# Patient Record
Sex: Male | Born: 1955
Health system: Southern US, Community
[De-identification: ages and names within clinical notes are randomized; demographics above are authoritative.]

## PROBLEM LIST (undated history)

## (undated) ENCOUNTER — Ambulatory Visit (HOSPITAL_COMMUNITY): Admission: EM | Disposition: A | Discharge status: Other Acute Inpatient Hospital | Payer: BLUE CROSS/BLUE SHIELD

## (undated) DIAGNOSIS — I2510 Atherosclerotic heart disease of native coronary artery without angina pectoris: Secondary | ICD-10-CM

## (undated) DIAGNOSIS — I10 Essential (primary) hypertension: Secondary | ICD-10-CM

## (undated) DIAGNOSIS — Z87891 Personal history of nicotine dependence: Secondary | ICD-10-CM

## (undated) DIAGNOSIS — F32A Depression, unspecified: Secondary | ICD-10-CM

## (undated) DIAGNOSIS — Z8719 Personal history of other diseases of the digestive system: Secondary | ICD-10-CM

## (undated) DIAGNOSIS — C4491 Basal cell carcinoma of skin, unspecified: Secondary | ICD-10-CM

## (undated) DIAGNOSIS — U071 COVID-19: Secondary | ICD-10-CM

## (undated) DIAGNOSIS — M1990 Unspecified osteoarthritis, unspecified site: Secondary | ICD-10-CM

## (undated) DIAGNOSIS — J40 Bronchitis, not specified as acute or chronic: Secondary | ICD-10-CM

## (undated) DIAGNOSIS — R32 Unspecified urinary incontinence: Secondary | ICD-10-CM

## (undated) DIAGNOSIS — F1910 Other psychoactive substance abuse, uncomplicated: Secondary | ICD-10-CM

## (undated) DIAGNOSIS — F419 Anxiety disorder, unspecified: Secondary | ICD-10-CM

## (undated) DIAGNOSIS — H269 Unspecified cataract: Secondary | ICD-10-CM

## (undated) HISTORY — DX: Unspecified cataract: H26.9

## (undated) HISTORY — DX: Personal history of nicotine dependence: Z87.891

## (undated) HISTORY — DX: Essential (primary) hypertension: I10

## (undated) HISTORY — PX: HERNIA REPAIR: SHX51

## (undated) HISTORY — PX: REPLACEMENT TOTAL KNEE BILATERAL: SUR1225

## (undated) HISTORY — PX: TONSILLECTOMY: SUR1361

## (undated) HISTORY — DX: Unspecified urinary incontinence: R32

## (undated) HISTORY — PX: KNEE ARTHROSCOPY: SUR90

---
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
## 2009-12-29 ENCOUNTER — Emergency Department: Payer: Self-pay | Admitting: Internal Medicine

## 2014-05-04 DIAGNOSIS — H9193 Unspecified hearing loss, bilateral: Secondary | ICD-10-CM | POA: Insufficient documentation

## 2014-06-25 ENCOUNTER — Encounter (HOSPITAL_COMMUNITY): Payer: Self-pay | Admitting: Emergency Medicine

## 2014-06-25 ENCOUNTER — Emergency Department (HOSPITAL_COMMUNITY)
Admission: EM | Admit: 2014-06-25 | Discharge: 2014-06-25 | Disposition: A | Discharge status: Home / Self Care | Payer: BLUE CROSS/BLUE SHIELD | Attending: Emergency Medicine | Admitting: Emergency Medicine

## 2014-06-25 DIAGNOSIS — S0501XA Injury of conjunctiva and corneal abrasion without foreign body, right eye, initial encounter: Secondary | ICD-10-CM | POA: Diagnosis not present

## 2014-06-25 DIAGNOSIS — Y9389 Activity, other specified: Secondary | ICD-10-CM | POA: Insufficient documentation

## 2014-06-25 DIAGNOSIS — H66001 Acute suppurative otitis media without spontaneous rupture of ear drum, right ear: Secondary | ICD-10-CM

## 2014-06-25 DIAGNOSIS — Y998 Other external cause status: Secondary | ICD-10-CM | POA: Insufficient documentation

## 2014-06-25 DIAGNOSIS — H9201 Otalgia, right ear: Secondary | ICD-10-CM | POA: Diagnosis present

## 2014-06-25 DIAGNOSIS — Y9289 Other specified places as the place of occurrence of the external cause: Secondary | ICD-10-CM | POA: Insufficient documentation

## 2014-06-25 DIAGNOSIS — X58XXXA Exposure to other specified factors, initial encounter: Secondary | ICD-10-CM | POA: Diagnosis not present

## 2014-06-25 MED ORDER — NAPROXEN 500 MG PO TABS: 500.0000 mg | ORAL_TABLET | Freq: Two times a day (BID) | ORAL | Status: DC

## 2014-06-25 MED ORDER — HYDROCODONE-ACETAMINOPHEN 5-325 MG PO TABS: 1.0000 | ORAL_TABLET | ORAL | Status: DC | PRN

## 2014-06-25 MED ORDER — HYDROCODONE-ACETAMINOPHEN 5-325 MG PO TABS
2.0000 | ORAL_TABLET | Freq: Once | ORAL | Status: AC
  Administered 2014-06-25: 2 via ORAL
  Filled 2014-06-25: qty 2

## 2014-06-25 MED ORDER — NAPROXEN 250 MG PO TABS
500.0000 mg | ORAL_TABLET | Freq: Once | ORAL | Status: AC
  Administered 2014-06-25: 500 mg via ORAL
  Filled 2014-06-25: qty 2

## 2014-06-25 MED ORDER — FLUORESCEIN SODIUM 1 MG OP STRP
1.0000 | ORAL_STRIP | Freq: Once | OPHTHALMIC | Status: AC
  Administered 2014-06-25: 1 via OPHTHALMIC
  Filled 2014-06-25: qty 1

## 2014-06-25 MED ORDER — AMOXICILLIN 500 MG PO CAPS: 500.0000 mg | ORAL_CAPSULE | Freq: Three times a day (TID) | ORAL | Status: DC

## 2014-06-25 MED ORDER — TETRACAINE HCL 0.5 % OP SOLN
1.0000 [drp] | Freq: Once | OPHTHALMIC | Status: AC
  Administered 2014-06-25: 1 [drp] via OPHTHALMIC
  Filled 2014-06-25: qty 2

## 2014-06-25 MED ORDER — CIPROFLOXACIN HCL 0.3 % OP SOLN: 1.0000 [drp] | OPHTHALMIC | Status: DC

## 2014-06-25 NOTE — Discharge Instructions (Signed)
Please follow directions provided. Be sure to follow-up with your primary care provider to ensure you're getting better. If your symptoms do not improve, please follow up with your ear nose and throat doctor for your ear and the ophthalmology referral provided for your eye. Please take the oral antibiotics as directed and use the drops for the abrasion to the your cornea in your right eye. Please use the naproxen twice a day for pain and inflammation. Use Vicodin for pain not relieved by the naproxen. Hesitate to return for any new, worsening, or concerning symptoms.  SEEK MEDICAL CARE IF:  You have pain, light sensitivity, and a scratchy feeling in one eye or both eyes.  Your pressure patch keeps loosening up, and you can blink your eye under the patch after treatment.  Any kind of discharge develops from the eye after treatment or if the lids stick together in the morning.  You have the same symptoms in the morning as you did with the original abrasion days, weeks, or months after the abrasion healed.   SEEK IMMEDIATE MEDICAL CARE IF:  You have pain that is not controlled with medicine.  You have swelling, redness, or pain around your ear or stiffness in your neck.  You notice that part of your face is paralyzed.  You notice that the bone behind your ear (mastoid) is tender when you touch it.

## 2014-06-25 NOTE — ED Notes (Signed)
Patient here with complaint of right eye and right ear pain. 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 States that over the past 2 weeks his right eye has become irritated and somewhat swollen. Also reports that right ear began to hurt tonight, he felt a pop in his ear, and he began to have severe right ear pain. States that he took a Hydrocodone about 30 minutes ago which has helped the pain significantly.

## 2014-06-25 NOTE — ED Notes (Signed)
Pt A&OX4, ambulatory at d/c with steady gait, NAD 

## 2014-06-25 NOTE — ED Provider Notes (Signed)
CSN: [id number]     Arrival date & time 06/25/14  2120 History   This chart was scribed for [employer]. Alvino Chapel, MD by Chester Holstein, ED Scribe. This patient was seen in room TR08C/TR08C and the patient's care was started at 10:01 PM.      Chief Complaint  Patient presents with  . Eye Pain  . Otalgia     Patient is a 59 y.o. male presenting with eye pain and ear pain. The history is provided by the patient. No language interpreter was used.  Eye Pain  Otalgia   HPI Comments: Samuel [name] is a 59 y.o. male who presents to the Emergency Department complaining of waxing and waning eye pain with onset 12 days ago. Pt notes associated eye swelling, pain, discharge, and itching. Pt notes discharge is clear during day. Pt notes 10/10 ear pain with onset today at 5PM. Pt notes he tried to equalize pressure in his ear, and states he felt a pop in his ear at onset and pain worsened.  Pt notes he has used a compress for relief.  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   History reviewed. No pertinent past medical history. History reviewed. No pertinent past surgical history. History reviewed. No pertinent family history. History  Substance Use Topics  . Smoking status: Never Smoker   . Smokeless tobacco: Not on file  . Alcohol Use: No    Review of Systems  HENT: Positive for ear pain.   Eyes: Positive for pain, discharge, redness and itching.  All other systems reviewed and are negative.     Allergies  Review of patient's allergies indicates no known allergies.  Home Medications   Prior to Admission medications   Not on File   BP 173/97 mmHg  Pulse 87  Temp(Src) 97.3 F (36.3 C) (Oral)  Resp 20  Ht 5\' 7"  (1.702 m)  Wt 260 lb (117.935 kg)  BMI 40.71 kg/m2  SpO2 97% Physical Exam  Constitutional: He is oriented to person, place, and time. He appears well-developed and well-nourished.  HENT:  Head: Normocephalic.  Right Ear: Tympanic  membrane is erythematous and bulging. Tympanic membrane is not perforated.  Erythema to right ear canal TM opaque with effusion noted behind the ear drum  Eyes: Conjunctivae are normal.  Slit lamp exam:      The right eye shows corneal abrasion (center of cornea).  Neck: Normal range of motion. Neck supple.  Pulmonary/Chest: Effort normal.  Musculoskeletal: Normal range of motion.  Neurological: He is alert and oriented to person, place, and time.  Skin: Skin is warm and dry.  Psychiatric: He has a normal mood and affect. His behavior is normal.  Nursing note and vitals reviewed.   ED Course  Procedures (including critical care time) DIAGNOSTIC STUDIES: Oxygen Saturation is 97% on room air, normal by my interpretation.    COORDINATION OF CARE: 10:50 PM Discussed treatment plan with patient at beside, the patient agrees with the plan and has no further questions at this time.  Labs Review Labs Reviewed - No data to display  Imaging Review No results found.   EKG Interpretation None      MDM   Final diagnoses:  Corneal abrasion, right, initial encounter  Acute suppurative otitis media of right ear without spontaneous rupture of tympanic membrane, recurrence not specified   59 yo male presenting with report of 10 days of red, itchy eye, with tearing, on  slit lamp exam, no foreign body, but central corneal abrasion noted. Pt is not a contact lens wearer.  Exam non-concerning for orbital cellulitis, hyphema, corneal ulcers. Patient understands to follow up with ophthalmology, & to return to ER if new symptoms develop including change in vision, purulent drainage, or entrapment. Prescription for Ciloxan provided. Pt also reports new onset ear pain today.  He has had pressure behind ear and attempted to normalize pressure today which caused significant pain he has since been having difficulty hearing.  Exam consistent with acute otitis media. He has no mastoid tenderness and no  concern for acute mastoiditis, meningitis. He was prescribed a amoxicillin and discussed following up with his PCP. Pt is well-appearing, in no acute distress and vital signs are stable.  They appear safe to be discharged.  Return precautions provided.       I personally performed the services described in this documentation, which was scribed in my presence. The recorded information has been reviewed and is accurate.  Filed Vitals:   06/25/14 2130 06/25/14 2253  BP: 173/97 144/84  Pulse: 87 82  Temp: 97.3 F (36.3 C)   TempSrc: Oral   Resp: 20 18  Height: 5\' 7"  (1.702 m)   Weight: 260 lb (117.935 kg)   SpO2: 97% 96%   Meds given in ED:  Medications  tetracaine (PONTOCAINE) 0.5 % ophthalmic solution 1 drop (1 drop Right Eye Given 06/25/14 2214)  fluorescein ophthalmic strip 1 strip (1 strip Right Eye Given 06/25/14 2213)  HYDROcodone-acetaminophen (NORCO/VICODIN) 5-325 MG per tablet 2 tablet (2 tablets Oral Given 06/25/14 2252)  naproxen (NAPROSYN) tablet 500 mg (500 mg Oral Given 06/25/14 2305)    Discharge Medication List as of 06/25/2014 11:02 PM    START taking these medications   Details  amoxicillin (AMOXIL) 500 MG capsule Take 1 capsule (500 mg total) by mouth 3 (three) times daily., Starting 06/25/2014, Until Discontinued, Print    ciprofloxacin (CILOXAN) 0.3 % ophthalmic solution Place 1 drop into the right eye every 4 (four) hours. Place one drop in effected eye every 4 hours until follow up with opthalmologist, Starting 06/25/2014, Until Discontinued, Print    HYDROcodone-acetaminophen (NORCO/VICODIN) 5-325 MG per tablet Take 1-2 tablets by mouth every 4 (four) hours as needed., Starting 06/25/2014, Until Discontinued, Print    naproxen (NAPROSYN) 500 MG tablet Take 1 tablet (500 mg total) by mouth 2 (two) times daily., Starting 06/25/2014, Until Discontinued, Print             [name], NP 06/26/14 [place] Alvino Chapel, MD 06/28/14 0700

## 2017-05-13 NOTE — H&P (Signed)
TOTAL KNEE ADMISSION H&P  Patient is being admitted for right total knee arthroplasty.  Subjective:  Chief Complaint:    Right knee primary OA / pain  HPI: Samuel [name], 61 y.o. male, has a history of pain and functional disability in the right knee due to arthritis and has failed non-surgical conservative treatments for greater than 12 weeks to include NSAID's and/or analgesics, corticosteriod injections, viscosupplementation injections and activity modification.  Onset of symptoms was gradual, starting 1+ years ago with gradually worsening course since that time. The patient noted prior procedures on the knee to include  arthroscopy on the right knee(s).  Patient currently rates pain in the right knee(s) at 8 out of 10 with activity. Patient has night pain, worsening of pain with activity and weight bearing, pain that interferes with activities of daily living, pain with passive range of motion, crepitus and joint swelling.  Patient has evidence of periarticular osteophytes and joint space narrowing by imaging studies.  There is no active infection.  Risks, benefits and expectations were discussed with the patient.  Risks including but not limited to the risk of anesthesia, blood clots, nerve damage, blood vessel damage, failure of the prosthesis, infection and up to and including death.  Patient understand the risks, benefits and expectations and wishes to proceed with surgery.   PCP: Samuel Sheldon, PA-C  D/C Plans:       Home   Post-op Meds:       No Rx given  Tranexamic Acid:      To be given - IV   Decadron:      Is to be given  FYI:      ASA  Norco  DME:   Pt already has equipment   PT:   OPPT Rx given   Patient Active Problem List   Diagnosis Date Noted  . Essential hypertension 05/27/2017  . Prostate cancer screening 05/27/2017  . Severe obesity (BMI >= 40) ([place]) 05/27/2017   No past medical history on file.  No past surgical history on file.  No current  facility-administered medications for this encounter.    Current Outpatient Medications  Medication Sig Dispense Refill Last Dose  . lisinopril (PRINIVIL,ZESTRIL) 10 MG tablet Take 1 tablet (10 mg total) by mouth daily. 30 tablet 0    No Known Allergies   Social History   Tobacco Use  . Smoking status: Never Smoker  . Smokeless tobacco: Never Used  Substance Use Topics  . Alcohol use: No       Review of Systems  Constitutional: Negative.   HENT: Negative.   Eyes: Negative.   Respiratory: Negative.   Cardiovascular: Negative.   Gastrointestinal: Negative.   Genitourinary: Negative.   Musculoskeletal: Positive for joint pain.  Skin: Negative.   Neurological: Negative.   Endo/Heme/Allergies: Negative.   Psychiatric/Behavioral: Negative.     Objective:  Physical Exam  Constitutional: He is oriented to person, place, and time. He appears well-developed.  HENT:  Head: Normocephalic.  Eyes: Pupils are equal, round, and reactive to light.  Neck: Neck supple. No JVD present. No tracheal deviation present. No thyromegaly present.  Cardiovascular: Normal rate, regular rhythm and intact distal pulses.  Respiratory: Effort normal and breath sounds normal. No respiratory distress. He has no wheezes.  GI: Soft. There is no tenderness. There is no guarding.  Musculoskeletal:       Right knee: He exhibits decreased range of motion, swelling and bony tenderness. He exhibits no ecchymosis, no deformity, no  laceration and no erythema. Tenderness found.  Lymphadenopathy:    He has no cervical adenopathy.  Neurological: He is alert and oriented to person, place, and time. A sensory deficit (bilateral LE numbness / tingling) is present.  Skin: Skin is warm and dry.  Psychiatric: He has a normal mood and affect.      Labs:  Estimated body mass index is 46.29 kg/m as calculated from the following:   Height as of 05/27/17: 5\' 6"  (1.676 m).   Weight as of 05/27/17: 130.1 kg (286 lb  12.8 oz).   Imaging Review Plain radiographs demonstrate severe degenerative joint disease of the right knee(s). The bone quality appears to be good for age and reported activity level.  Assessment/Plan:  End stage arthritis, right knee   The patient history, physical examination, clinical judgment of the provider and imaging studies are consistent with end stage degenerative joint disease of the right knee(s) and total knee arthroplasty is deemed medically necessary. The treatment options including medical management, injection therapy arthroscopy and arthroplasty were discussed at length. The risks and benefits of total knee arthroplasty were presented and reviewed. The risks due to aseptic loosening, infection, stiffness, patella tracking problems, thromboembolic complications and other imponderables were discussed. The patient acknowledged the explanation, agreed to proceed with the plan and consent was signed. Patient is being admitted for inpatient treatment for surgery, pain control, PT, OT, prophylactic antibiotics, VTE prophylaxis, progressive ambulation and ADL's and discharge planning. The patient is planning to be discharged home.    Samuel Pugh Babish   PA-C  05/28/2017, 5:01 PM

## 2017-05-20 NOTE — Patient Instructions (Signed)
Samuel [name]  05/20/2017   Your procedure is scheduled on:  Monday, Dec. 17, 2018   Report to [facility] Main  Entrance    Report to admitting at 8:45 AM   Call this number if you have problems the morning of surgery [phone]   Do not eat food or drink liquids :After Midnight.    Take these medicines the morning of surgery with A SIP OF WATER: None                               You may not have any metal on your body including jewelry             Do not wear lotions, powders, perfumes,  or deodorant                          Men may shave face and neck.   Do not bring valuables to the hospital. [place].   Contacts, dentures or bridgework may not be worn into surgery.   Leave suitcase in the car. After surgery it may be brought to your room.              Please read over the following fact sheets you were given: _____________________________________________________________________  [facility] - Preparing for Surgery Before surgery, you can play an important role.  Because skin is not sterile, your skin needs to be as free of germs as possible.  You can reduce the number of germs on your skin by washing with CHG (chlorahexidine gluconate) soap before surgery.  CHG is an antiseptic cleaner which kills germs and bonds with the skin to continue killing germs even after washing. Please DO NOT use if you have an allergy to CHG or antibacterial soaps.  If your skin becomes reddened/irritated stop using the CHG and inform your nurse when you arrive at Short Stay. Do not shave (including legs and underarms) for at least 48 hours prior to the first CHG shower.  You may shave your face/neck.  Please follow these instructions carefully:  1.  Shower with CHG Soap the night before surgery and the  morning of surgery.  2.  If you choose to wash your hair, wash your hair first as usual with your normal   shampoo.  3.  After you shampoo, rinse your hair and body thoroughly to remove the shampoo.                             4.  Use CHG as you would any other liquid soap.  You can apply chg directly to the skin and wash.  Gently with a scrungie or clean washcloth.  5.  Apply the CHG Soap to your body ONLY FROM THE NECK DOWN.   Do   not use on face/ open                           Wound or open sores. Avoid contact with eyes, ears mouth and   genitals (private parts).  Wash face,  Genitals (private parts) with your normal soap.             6.  Wash thoroughly, paying special attention to the area where your    surgery  will be performed.  7.  Thoroughly rinse your body with warm water from the neck down.  8.  DO NOT shower/wash with your normal soap after using and rinsing off the CHG Soap.                9.  Pat yourself dry with a clean towel.            10.  Wear clean pajamas.            11.  Place clean sheets on your bed the night of your first shower and do not  sleep with pets. Day of Surgery : Do not apply any lotions/deodorants the morning of surgery.  Please wear clean clothes to the hospital/surgery center.  FAILURE TO FOLLOW THESE INSTRUCTIONS MAY RESULT IN THE CANCELLATION OF YOUR SURGERY  PATIENT SIGNATURE_________________________________  NURSE SIGNATURE__________________________________  ________________________________________________________________________   Samuel [name]  An incentive spirometer is a tool that can help keep your lungs clear and active. This tool measures how well you are filling your lungs with each breath. Taking long deep breaths may help reverse or decrease the chance of developing breathing (pulmonary) problems (especially infection) following:  A long period of time when you are unable to move or be active. BEFORE THE PROCEDURE   If the spirometer includes an indicator to show your best effort, your nurse or respiratory  therapist will set it to a desired goal.  If possible, sit up straight or lean slightly forward. Try not to slouch.  Hold the incentive spirometer in an upright position. INSTRUCTIONS FOR USE  1. Sit on the edge of your bed if possible, or sit up as far as you can in bed or on a chair. 2. Hold the incentive spirometer in an upright position. 3. Breathe out normally. 4. Place the mouthpiece in your mouth and seal your lips tightly around it. 5. Breathe in slowly and as deeply as possible, raising the piston or the ball toward the top of the column. 6. Hold your breath for 3-5 seconds or for as long as possible. Allow the piston or ball to fall to the bottom of the column. 7. Remove the mouthpiece from your mouth and breathe out normally. 8. Rest for a few seconds and repeat Steps 1 through 7 at least 10 times every 1-2 hours when you are awake. Take your time and take a few normal breaths between deep breaths. 9. The spirometer may include an indicator to show your best effort. Use the indicator as a goal to work toward during each repetition. 10. After each set of 10 deep breaths, practice coughing to be sure your lungs are clear. If you have an incision (the cut made at the time of surgery), support your incision when coughing by placing a pillow or rolled up towels firmly against it. Once you are able to get out of bed, walk around indoors and cough well. You may stop using the incentive spirometer when instructed by your caregiver.  RISKS AND COMPLICATIONS  Take your time so you do not get dizzy or light-headed.  If you are in pain, you may need to take or ask for pain medication before doing incentive spirometry. It is harder to take a deep breath if you  are having pain. AFTER USE  Rest and breathe slowly and easily.  It can be helpful to keep track of a log of your progress. Your caregiver can provide you with a simple table to help with this. If you are using the spirometer at home,  follow these instructions: [place] IF:   You are having difficultly using the spirometer.  You have trouble using the spirometer as often as instructed.  Your pain medication is not giving enough relief while using the spirometer.  You develop fever of 100.5 F (38.1 C) or higher. SEEK IMMEDIATE MEDICAL CARE IF:   You cough up bloody sputum that had not been present before.  You develop fever of 102 F (38.9 C) or greater.  You develop worsening pain at or near the incision site. MAKE SURE YOU:   Understand these instructions.  Will watch your condition.  Will get help right away if you are not doing well or get worse. Document Released: 10/13/2006 Document Revised: 08/25/2011 Document Reviewed: 12/14/2006 ExitCare Patient Information 2014 [employer], [place].   ________________________________________________________________________  WHAT IS A BLOOD TRANSFUSION? Blood Transfusion Information  A transfusion is the replacement of blood or some of its parts. Blood is made up of multiple cells which provide different functions.  Red blood cells carry oxygen and are used for blood loss replacement.  White blood cells fight against infection.  Platelets control bleeding.  Plasma helps clot blood.  Other blood products are available for specialized needs, such as hemophilia or other clotting disorders. BEFORE THE TRANSFUSION  Who gives blood for transfusions?   Healthy volunteers who are fully evaluated to make sure their blood is safe. This is blood bank blood. Transfusion therapy is the safest it has ever been in the practice of medicine. Before blood is taken from a donor, a complete history is taken to make sure that person has no history of diseases nor engages in risky social behavior (examples are intravenous drug use or sexual activity with multiple partners). The donor's travel history is screened to minimize risk of transmitting infections, such as malaria.  The donated blood is tested for signs of infectious diseases, such as HIV and hepatitis. The blood is then tested to be sure it is compatible with you in order to minimize the chance of a transfusion reaction. If you or a relative donates blood, this is often done in anticipation of surgery and is not appropriate for emergency situations. It takes many days to process the donated blood. RISKS AND COMPLICATIONS Although transfusion therapy is very safe and saves many lives, the main dangers of transfusion include:   Getting an infectious disease.  Developing a transfusion reaction. This is an allergic reaction to something in the blood you were given. Every precaution is taken to prevent this. The decision to have a blood transfusion has been considered carefully by your caregiver before blood is given. Blood is not given unless the benefits outweigh the risks. AFTER THE TRANSFUSION  Right after receiving a blood transfusion, you will usually feel much better and more energetic. This is especially true if your red blood cells have gotten low (anemic). The transfusion raises the level of the red blood cells which carry oxygen, and this usually causes an energy increase.  The nurse administering the transfusion will monitor you carefully for complications. HOME CARE INSTRUCTIONS  No special instructions are needed after a transfusion. You may find your energy is better. Speak with your caregiver about any limitations on activity  for underlying diseases you may have. SEEK MEDICAL CARE IF:   Your condition is not improving after your transfusion.  You develop redness or irritation at the intravenous (IV) site. SEEK IMMEDIATE MEDICAL CARE IF:  Any of the following symptoms occur over the next 12 hours:  Shaking chills.  You have a temperature by mouth above 102 F (38.9 C), not controlled by medicine.  Chest, back, or muscle pain.  People around you feel you are not acting correctly or are  confused.  Shortness of breath or difficulty breathing.  Dizziness and fainting.  You get a rash or develop hives.  You have a decrease in urine output.  Your urine turns a dark color or changes to pink, red, or brown. Any of the following symptoms occur over the next 10 days:  You have a temperature by mouth above 102 F (38.9 C), not controlled by medicine.  Shortness of breath.  Weakness after normal activity.  The white part of the eye turns yellow (jaundice).  You have a decrease in the amount of urine or are urinating less often.  Your urine turns a dark color or changes to pink, red, or brown. Document Released: 05/30/2000 Document Revised: 08/25/2011 Document Reviewed: 01/17/2008 [facility] Patient Information 2014 [employer], [place].  _______________________________________________________________________

## 2017-05-27 ENCOUNTER — Other Ambulatory Visit: Payer: Self-pay

## 2017-05-27 ENCOUNTER — Ambulatory Visit (INDEPENDENT_AMBULATORY_CARE_PROVIDER_SITE_OTHER): Payer: BLUE CROSS/BLUE SHIELD | Admitting: Physician Assistant

## 2017-05-27 ENCOUNTER — Other Ambulatory Visit (HOSPITAL_COMMUNITY): Payer: Self-pay | Admitting: Emergency Medicine

## 2017-05-27 ENCOUNTER — Encounter: Payer: Self-pay | Admitting: Physician Assistant

## 2017-05-27 VITALS — BP 170/98 | HR 93 | Temp 98.4°F | Resp 16 | Ht 66.0 in | Wt 286.8 lb

## 2017-05-27 DIAGNOSIS — R197 Diarrhea, unspecified: Secondary | ICD-10-CM | POA: Diagnosis not present

## 2017-05-27 DIAGNOSIS — Z0000 Encounter for general adult medical examination without abnormal findings: Secondary | ICD-10-CM | POA: Diagnosis not present

## 2017-05-27 DIAGNOSIS — R110 Nausea: Secondary | ICD-10-CM | POA: Diagnosis not present

## 2017-05-27 DIAGNOSIS — Z125 Encounter for screening for malignant neoplasm of prostate: Secondary | ICD-10-CM | POA: Diagnosis not present

## 2017-05-27 DIAGNOSIS — I10 Essential (primary) hypertension: Secondary | ICD-10-CM | POA: Insufficient documentation

## 2017-05-27 DIAGNOSIS — Z114 Encounter for screening for human immunodeficiency virus [HIV]: Secondary | ICD-10-CM

## 2017-05-27 DIAGNOSIS — Z01818 Encounter for other preprocedural examination: Secondary | ICD-10-CM

## 2017-05-27 DIAGNOSIS — R0609 Other forms of dyspnea: Secondary | ICD-10-CM | POA: Diagnosis not present

## 2017-05-27 DIAGNOSIS — Z1159 Encounter for screening for other viral diseases: Secondary | ICD-10-CM | POA: Diagnosis not present

## 2017-05-27 DIAGNOSIS — R0600 Dyspnea, unspecified: Secondary | ICD-10-CM

## 2017-05-27 MED ORDER — LISINOPRIL 10 MG PO TABS: 10.0000 mg | ORAL_TABLET | Freq: Every day | ORAL | 0 refills | Status: DC

## 2017-05-27 NOTE — Progress Notes (Signed)
Patient ID: Samuel [name] MRN: [id number], DOB: August 16, 1955, 61 y.o. Date of Encounter: '@DATE'$ @  Chief Complaint:  Chief Complaint  Patient presents with  . clearance for surgery  . Foot Swelling  . Headache  . Diarrhea    xdays    HPI: 61 y.o. year old male  presents with above.   He presents as a new patient to establish care.  He is scheduled for right total knee replacement on Monday 06/01/2017 with Dr. Alvan Dame. However, he states that he already realizes that this is probably going to have to be postponed/rescheduled and I have told him today that it definitely will need to be.  I informed him to call [facility] and inform them of today's visit and plans and need to postpone surgery. In regards to the right total knee replacement he also says "I get myself all worked up-----I have heard so many stories of people having knee replacement surgery and that the first 20 days are hell"--- "and that he is concerned because he cannot take any type of opioids because they cause him itching so is concerned that he cannot take pain medicines and the amount of pain that he will have."  He reports that he had a cardiac catheterization about 10 years ago at [facility].  Says that he was told that there was some blockage but not significant enough to require stents.  However says "but that was 10 years ago so I do not know if things have changed since then ".  Says that recently when he walks to the mailbox and back, he is significantly short of breath.  States that this is new.  Names off activities that he used to be able to do-- with no shortness of breath / DOE.  Also discussed that his blood pressure is reading significantly high today.  Reports that in the past when he would be at work if he had a headache he would get the CNA at work to check his blood pressure and it would be high.  Says "then later the headache would be gone" /resolved and that he "would get his blood pressure  rechecked and it would be okay." He has no other known history of high blood pressure but also admits that he has never had a PCP until now.  Reports that he has been experiencing nausea and diarrhea for the past 3 days.  Has had no vomiting.  Says that about 3 days ago the vomiting was significant frequency.  Says that yesterday he did not eat and his stomach would feel upset but then nothing would really come out because he had not eaten.  States that he ate again last night and then did have diarrhea again this morning. Later, when it came time to get labs and I was wondering whether he was fasting, if he had had anything to eat today-- he says "A couple of pieces of cheese."  Reports that he quit smoking 20 years ago.  Reports that he has no other known medical history.   History reviewed. No pertinent past medical history.   Home Meds: Outpatient Medications Prior to Visit  Medication Sig Dispense Refill  . amoxicillin (AMOXIL) 500 MG capsule Take 1 capsule (500 mg total) by mouth 3 (three) times daily. 30 capsule 0  . ciprofloxacin (CILOXAN) 0.3 % ophthalmic solution Place 1 drop into the right eye every 4 (four) hours. Place one drop in effected eye every 4 hours until follow up  with opthalmologist 2.5 mL 1  . HYDROcodone-acetaminophen (NORCO/VICODIN) 5-325 MG per tablet Take 1-2 tablets by mouth every 4 (four) hours as needed. 6 tablet 0  . naproxen (NAPROSYN) 500 MG tablet Take 1 tablet (500 mg total) by mouth 2 (two) times daily. 30 tablet 0   No facility-administered medications prior to visit.     Allergies: No Known Allergies  Social History   Socioeconomic History  . Marital status: Married    Spouse name: Not on file  . Number of children: Not on file  . Years of education: Not on file  . Highest education level: Not on file  Social Needs  . Financial resource strain: Not on file  . Food insecurity - worry: Not on file  . Food insecurity - inability: Not on file    . Transportation needs - medical: Not on file  . Transportation needs - non-medical: Not on file  Occupational History  . Not on file  Tobacco Use  . Smoking status: Never Smoker  . Smokeless tobacco: Never Used  Substance and Sexual Activity  . Alcohol use: No  . Drug use: No  . Sexual activity: Not on file  Other Topics Concern  . Not on file  Social History Narrative  . Not on file    History reviewed. No pertinent family history.   Review of Systems:  See HPI for pertinent ROS. All other ROS negative.    Physical Exam: Blood pressure (!) 170/98, pulse 93, temperature 98.4 F (36.9 C), temperature source Oral, resp. rate 16, height '5\' 6"'$  (1.676 m), weight 130.1 kg (286 lb 12.8 oz), SpO2 99 %., Body mass index is 46.29 kg/m. General: Obese WM. Appears in no acute distress. Neck: Supple. No thyromegaly. No lymphadenopathy. No carotid bruits (but large neck) Lungs: Clear bilaterally to auscultation without wheezes, rales, or rhonchi. Breathing is unlabored. Heart: RRR with S1 S2. No murmurs, rubs, or gallops. Abdomen: Soft, non-tender, non-distended with normoactive bowel sounds. No hepatomegaly. No rebound/guarding. Umbilical hernia. Very obese Abdomen. Musculoskeletal:  Strength and tone normal for age. Extremities/Skin: Warm and dry. Trace pedal edema. Neuro: Alert and oriented X 3. Moves all extremities spontaneously. Gait is normal. CNII-XII grossly in tact. Psych:  Responds to questions appropriately with a normal affect.     ASSESSMENT AND PLAN:  61 y.o. year old male with   1. Encounter for medical examination to establish care   2. Preoperative evaluation to rule out surgical contraindication - CBC with Differential/Platelet - COMPLETE METABOLIC PANEL WITH GFR - TSH - HIV antibody - PSA - Hepatitis C antibody - Ambulatory referral to Cardiology  3. Dyspnea on exertion - CBC with Differential/Platelet - COMPLETE METABOLIC PANEL WITH GFR - TSH -  Ambulatory referral to Cardiology  4. Essential hypertension BP elevated.  Obtain baseline lab.  Start lisinopril 10 mg daily.  Follow-up visit in 2 weeks to recheck BP and be met on medication. - COMPLETE METABOLIC PANEL WITH GFR - Ambulatory referral to Cardiology - lisinopril (PRINIVIL,ZESTRIL) 10 MG tablet; Take 1 tablet (10 mg total) by mouth daily.  Dispense: 30 tablet; Refill: 0  5. Nausea without vomiting Check lab to evaluate.  Also informed him to stick with clear liquid diet and then slowly advance to bland diet once symptoms have resolved. - CBC with Differential/Platelet - COMPLETE METABOLIC PANEL WITH GFR  6. Diarrhea, unspecified type Check lab to evaluate.  Also informed him to stick with clear liquid diet and then slowly advance to bland  diet once symptoms have resolved. - CBC with Differential/Platelet - COMPLETE METABOLIC PANEL WITH GFR  7. Prostate cancer screening - PSA  8. Need for hepatitis C screening test - Hepatitis C antibody  9. Screening for HIV (human immunodeficiency virus) - HIV antibody  10. Severe obesity (BMI >= 40) (HCC) Will discuss proper diet and exercise at future visit.  11. Possible OSA --Will discuss at future OV. He has very large neck, HTN, Obesity---- high risk for OSA---will discuss at future OV  12. Other Preventive Care---- --Will discuss FLP, Colonoscopy, Immunizations at future OV.   Follow-up office visit in 1-2 weeks.  Follow-up sooner if needed.    [address] [place], [place], [facility] 05/27/2017 4:50 PM

## 2017-05-28 ENCOUNTER — Encounter (HOSPITAL_COMMUNITY)
Admission: RE | Admit: 2017-05-28 | Discharge: 2017-05-28 | Disposition: A | Discharge status: Home / Self Care | Payer: Self-pay | Source: Ambulatory Visit | Attending: Orthopedic Surgery | Admitting: Orthopedic Surgery

## 2017-05-28 LAB — HEPATITIS C ANTIBODY
Hepatitis C Ab: NONREACTIVE
SIGNAL TO CUT-OFF: 0.21 (ref <1.00)

## 2017-05-30 LAB — COMPLETE METABOLIC PANEL WITH GFR
AG Ratio: 1.5 (calc) (ref 1.0–2.5)
ALT: 87 U/L — ABNORMAL HIGH (ref 9–46)
AST: 66 U/L — ABNORMAL HIGH (ref 10–35)
Albumin: 4.5 g/dL (ref 3.6–5.1)
Alkaline phosphatase (APISO): 59 U/L (ref 40–115)
BUN: 18 mg/dL (ref 7–25)
CO2: 24 mmol/L (ref 20–32)
Calcium: 9.2 mg/dL (ref 8.6–10.3)
Chloride: 95 mmol/L — ABNORMAL LOW (ref 98–110)
Creat: 0.87 mg/dL (ref 0.70–1.25)
GFR, Est African American: 106 mL/min/{1.73_m2} (ref >=60)
GFR, Est Non African American: 92 mL/min/{1.73_m2} (ref >=60)
Globulin: 3 g/dL (calc) (ref 1.9–3.7)
Glucose, Bld: 102 mg/dL — ABNORMAL HIGH (ref 65–99)
Potassium: 4.3 mmol/L (ref 3.5–5.3)
Sodium: 133 mmol/L — ABNORMAL LOW (ref 135–146)
Total Bilirubin: 1.3 mg/dL — ABNORMAL HIGH (ref 0.2–1.2)
Total Protein: 7.5 g/dL (ref 6.1–8.1)

## 2017-05-30 LAB — HIV ANTIBODY (ROUTINE TESTING W REFLEX): HIV 1&2 Ab, 4th Generation: NONREACTIVE

## 2017-05-30 LAB — TSH: TSH: 5.99 mIU/L — ABNORMAL HIGH (ref 0.40–4.50)

## 2017-05-30 LAB — HEPATITIS PANEL, ACUTE
Hep A IgM: NONREACTIVE
Hep B C IgM: NONREACTIVE
Hepatitis B Surface Ag: NONREACTIVE
Hepatitis C Ab: NONREACTIVE
SIGNAL TO CUT-OFF: 0.21 (ref <1.00)

## 2017-05-30 LAB — SPECIMEN COMPROMISED

## 2017-05-30 LAB — CBC WITH DIFFERENTIAL/PLATELET
Basophils Absolute: 61 cells/uL (ref 0–200)
Basophils Relative: 0.6 %
Eosinophils Absolute: 61 cells/uL (ref 15–500)
Eosinophils Relative: 0.6 %
HCT: 44.3 % (ref 38.5–50.0)
Hemoglobin: 15.5 g/dL (ref 13.2–17.1)
Lymphs Abs: 1918 cells/uL (ref 850–3900)
MCH: 31.5 pg (ref 27.0–33.0)
MCHC: 35 g/dL (ref 32.0–36.0)
MCV: 90 fL (ref 80.0–100.0)
MPV: 10.7 fL (ref 7.5–12.5)
Monocytes Relative: 11.2 %
Neutro Abs: 7018 cells/uL (ref 1500–7800)
Neutrophils Relative %: 68.8 %
Platelets: 160 10*3/uL (ref 140–400)
RBC: 4.92 10*6/uL (ref 4.20–5.80)
RDW: 12.1 % (ref 11.0–15.0)
Total Lymphocyte: 18.8 %
WBC mixed population: 1142 cells/uL — ABNORMAL HIGH (ref 200–950)
WBC: 10.2 10*3/uL (ref 3.8–10.8)

## 2017-05-30 LAB — TEST AUTHORIZATION

## 2017-05-30 LAB — T4, FREE: Free T4: 1 ng/dL (ref 0.8–1.8)

## 2017-05-30 LAB — PSA: PSA: 0.7 ng/mL (ref <=4.0)

## 2017-06-01 ENCOUNTER — Ambulatory Visit (HOSPITAL_COMMUNITY)
Admission: RE | Admit: 2017-06-01 | Payer: BLUE CROSS/BLUE SHIELD | Source: Ambulatory Visit | Admitting: Orthopedic Surgery

## 2017-06-01 ENCOUNTER — Encounter (HOSPITAL_COMMUNITY): Admission: RE | Payer: Self-pay | Source: Ambulatory Visit

## 2017-06-01 SURGERY — ARTHROPLASTY, KNEE, TOTAL
Anesthesia: Spinal | Site: Knee | Laterality: Right

## 2017-06-02 ENCOUNTER — Encounter: Payer: Self-pay | Admitting: Cardiovascular Disease

## 2017-06-02 ENCOUNTER — Ambulatory Visit: Payer: BLUE CROSS/BLUE SHIELD | Admitting: Cardiovascular Disease

## 2017-06-02 VITALS — BP 162/96 | HR 81 | Ht 66.0 in | Wt 282.0 lb

## 2017-06-02 DIAGNOSIS — E669 Obesity, unspecified: Secondary | ICD-10-CM

## 2017-06-02 DIAGNOSIS — R0600 Dyspnea, unspecified: Secondary | ICD-10-CM

## 2017-06-02 DIAGNOSIS — R0609 Other forms of dyspnea: Secondary | ICD-10-CM

## 2017-06-02 DIAGNOSIS — I10 Essential (primary) hypertension: Secondary | ICD-10-CM

## 2017-06-02 MED ORDER — POTASSIUM CHLORIDE ER 10 MEQ PO TBCR: 10.0000 meq | EXTENDED_RELEASE_TABLET | Freq: Every day | ORAL | 3 refills | Status: DC

## 2017-06-02 MED ORDER — HYDROCHLOROTHIAZIDE 25 MG PO TABS: 25.0000 mg | ORAL_TABLET | Freq: Every day | ORAL | 3 refills | Status: DC

## 2017-06-02 NOTE — H&P (View-Only) (Signed)
Cardiology Admission History and Physical:   Patient ID: SAIR FAULCON; MRN: [id number]; DOB: 01-08-1956   Admission date: (Not on file)  Primary Care Provider: Rennis Golden Primary Cardiologist:  Nahser  Primary Electrophysiologist:    Problem List 1.  Dyspnea with exertion 2.  Essential HTN 3.   Obesity. 4.   Chief Complaint:   HTN Preoperative evaluation prior to knee replacement  Patient Profile:   Samuel [name] is a 61 y.o. male with a history of hypertension and shortness of breath.   Seeing him today for the first time for preoperative evaluation prior to knee replacement.  History of Present Illness:   Mr. Smolen has had significant DOE  with exertion especially pronounced with the recent snow.  Has had some feet swelling . 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 HTN (hypertension)     History reviewed. No pertinent surgical history.   Medications Prior to Admission: Prior to Admission medications   Medication Sig Start Date End Date Taking? Authorizing Provider  DIPHENHYDRAMINE-ACETAMINOPHEN PO Take by mouth. 25-500MQ FOR SLEEP AS NEEDED   Yes [provider]  Ibuprofen 200 MG CAPS Take 1 capsule by mouth daily.   Yes [provider]  lisinopril (PRINIVIL,ZESTRIL) 10 MG tablet Take 1 tablet (10 mg total) by mouth daily. 05/27/17  Yes Orlena Sheldon, PA-C     Allergies:   No Known Allergies  Social History:   Social History   Socioeconomic History  . Marital status: Married    Spouse name: Not on file  . Number of children: Not on file  . Years of education: Not on file  . Highest education level: Not on file  Social Needs  . Financial resource strain: Not on file  . Food insecurity - worry: Not on file  .  Food insecurity - inability: Not on file  . Transportation needs - medical: Not on file  . Transportation needs - non-medical: Not on file  Occupational History  . Not on file  Tobacco Use  . Smoking status: Never Smoker  . Smokeless tobacco: Never Used  Substance and Sexual Activity  . Alcohol use: No  . Drug use: No  . Sexual activity: Not on file  Other Topics Concern  . Not on file  Social History Narrative  . Not on file    Family History:   The patient's family history includes CAD in his brother; Dementia in his mother; Heart attack in his father.    ROS:  Please see the history of present illness.  All other ROS reviewed and negative.     Physical Exam/Data:   Vitals:   06/02/17 1456  BP: (!) 162/96  Pulse: 81  SpO2: 98%  Weight: 282 lb (127.9 kg)  Height: 5\' 6"  (1.676 m)   @IOBRIEF @ Filed Weights   06/02/17 1456  Weight: 282 lb (127.9 kg)   Body mass index is 45.52 kg/m.  General:  Obese, middle age man  HEENT: normal Lymph: no adenopathy Neck: no JVD Endocrine:  No thryomegaly Vascular: No carotid bruits; FA pulses 2+ bilaterally without bruits  Cardiac:  normal S1, S2; RRR; no murmur  Lungs:  clear  to auscultation bilaterally, no wheezing, rhonchi or rales  ZOX:WRUE bilateral edema  Musculoskeletal:  No deformities, BUE and BLE strength normal and equal Skin: warm and dry  Neuro:  CNs 2-12 intact, no focal abnormalities noted Psych:  Normal affect    EKG:  The ECG that was done  was personally reviewed and demonstrates   NSR at 81.   Normal ECG .   Relevant CV Studies: Cath ~2008 showed minor coronary artery irreg.    Laboratory Data:  Chemistry Recent Labs  Lab 05/27/17 1647  NA 133*  K 4.3  CL 95*  CO2 24  GLUCOSE 102*  BUN 18  CREATININE 0.87  CALCIUM 9.2  GFRNONAA 92  GFRAA 106    Recent Labs  Lab 05/27/17 1647  PROT 7.5  AST 66*  ALT 87*  BILITOT 1.3*   Hematology Recent Labs  Lab 05/27/17 1647  WBC 10.2  RBC  4.92  HGB 15.5  HCT 44.3  MCV 90.0  MCH 31.5  MCHC 35.0  RDW 12.1  PLT 160   Cardiac EnzymesNo results for input(s): TROPONINI in the last 168 hours. No results for input(s): TROPIPOC in the last 168 hours.  BNPNo results for input(s): BNP, PROBNP in the last 168 hours.  DDimer No results for input(s): DDIMER in the last 168 hours.  Radiology/Studies:  No results found.  Assessment and Plan:   1. HTN:  Does not restrict his salt .  Does not eat fast foods.   Does not eat an excessive amount of salty foods.  Started on Lisinopril 2 weeks ago  Will add HCTZ 25 mg and Kdur 10 meq a day   2.  Dyspnea with Exertion: Shortness of breath with exertion might just be related to his hypertension and obesity but he has a very strong family history of coronary artery disease.  We will get a [place] study for further evaluation.  We will also work on his blood pressure.  3. Obesity:    Advised him to cut his carbs.  Needs to exercise   We will see him in 4-6 weeks for follow-up office visit.   Signed, Mertie Moores, MD  06/02/2017 3:17 PM

## 2017-06-02 NOTE — Patient Instructions (Signed)
Medication Instructions:  Your physician has recommended you make the following change in your medication:  START HCTZ (hydrochlorothiazide) 25 mg once daily in the AM START Kdur (potassium supplement) 10 meq once daily in the AM   Labwork: Your physician recommends that you return for lab work in: 2-3 weeks You will need to FAST for this appointment - nothing to eat or drink after midnight the night before except water.    Testing/Procedures: Your physician has requested that you have a lexiscan myoview. For further information please visit [email or web address]. Please follow instruction sheet, as given.  Your physician has requested that you have an echocardiogram. Echocardiography is a painless test that uses sound waves to create images of your heart. It provides your doctor with information about the size and shape of your heart and how well your heart's chambers and valves are working. This procedure takes approximately one hour. There are no restrictions for this procedure.   Follow-Up: Your physician recommends that you schedule a follow-up appointment in: 6 weeks with Dr. Acie Fredrickson   If you need a refill on your cardiac medications before your next appointment, please call your pharmacy.   Thank you for choosing [facility]! Christen Bame, RN [phone]

## 2017-06-02 NOTE — Progress Notes (Signed)
Cardiology Admission History and Physical:   Patient ID: Samuel [name]; MRN: [id number]; DOB: 06/14/1956   Admission date: (Not on file)  Primary Care Provider: Rennis [name] Primary Cardiologist:  Nahser  Primary Electrophysiologist:    Problem List 1.  Dyspnea with exertion 2.  Essential HTN 3.   Obesity. 4.   Chief Complaint:   HTN Preoperative evaluation prior to knee replacement  Patient Profile:   Samuel [name] is a 61 y.o. male with a history of hypertension and shortness of breath.   Seeing him today for the first time for preoperative evaluation prior to knee replacement.  History of Present Illness:   Samuel [name] has had significant DOE  with exertion especially pronounced with the recent snow.  Has had some feet swelling . 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 HTN (hypertension)     History reviewed. No pertinent surgical history.   Medications Prior to Admission: Prior to Admission medications   Medication Sig Start Date End Date Taking? Authorizing Provider  DIPHENHYDRAMINE-ACETAMINOPHEN PO Take by mouth. 25-500MQ FOR SLEEP AS NEEDED   Yes [provider]  Ibuprofen 200 MG CAPS Take 1 capsule by mouth daily.   Yes [provider]  lisinopril (PRINIVIL,ZESTRIL) 10 MG tablet Take 1 tablet (10 mg total) by mouth daily. 05/27/17  Yes Orlena Sheldon, PA-C     Allergies:   No Known Allergies  Social History:   Social History   Socioeconomic History  . Marital status: Married    Spouse name: Not on file  . Number of children: Not on file  . Years of education: Not on file  . Highest education level: Not on file  Social Needs  . Financial resource strain: Not on file  . Food insecurity - worry: Not on file  .  Food insecurity - inability: Not on file  . Transportation needs - medical: Not on file  . Transportation needs - non-medical: Not on file  Occupational History  . Not on file  Tobacco Use  . Smoking status: Never Smoker  . Smokeless tobacco: Never Used  Substance and Sexual Activity  . Alcohol use: No  . Drug use: No  . Sexual activity: Not on file  Other Topics Concern  . Not on file  Social History Narrative  . Not on file    Family History:   The patient's family history includes CAD in his brother; Dementia in his mother; Heart attack in his father.    ROS:  Please see the history of present illness.  All other ROS reviewed and negative.     Physical Exam/Data:   Vitals:   06/02/17 1456  BP: (!) 162/96  Pulse: 81  SpO2: 98%  Weight: 282 lb (127.9 kg)  Height: 5\' 6"  (1.676 m)   @IOBRIEF @ Filed Weights   06/02/17 1456  Weight: 282 lb (127.9 kg)   Body mass index is 45.52 kg/m.  General:  Obese, middle age man  HEENT: normal Lymph: no adenopathy Neck: no JVD Endocrine:  No thryomegaly Vascular: No carotid bruits; FA pulses 2+ bilaterally without bruits  Cardiac:  normal S1, S2; RRR; no murmur  Lungs:  clear  to auscultation bilaterally, no wheezing, rhonchi or rales  ZYY:QMGN bilateral edema  Musculoskeletal:  No deformities, BUE and BLE strength normal and equal Skin: warm and dry  Neuro:  CNs 2-12 intact, no focal abnormalities noted Psych:  Normal affect    EKG:  The ECG that was done  was personally reviewed and demonstrates   NSR at 81.   Normal ECG .   Relevant CV Studies: Cath ~2008 showed minor coronary artery irreg.    Laboratory Data:  Chemistry Recent Labs  Lab 05/27/17 1647  NA 133*  K 4.3  CL 95*  CO2 24  GLUCOSE 102*  BUN 18  CREATININE 0.87  CALCIUM 9.2  GFRNONAA 92  GFRAA 106    Recent Labs  Lab 05/27/17 1647  PROT 7.5  AST 66*  ALT 87*  BILITOT 1.3*   Hematology Recent Labs  Lab 05/27/17 1647  WBC 10.2  RBC  4.92  HGB 15.5  HCT 44.3  MCV 90.0  MCH 31.5  MCHC 35.0  RDW 12.1  PLT 160   Cardiac EnzymesNo results for input(s): TROPONINI in the last 168 hours. No results for input(s): TROPIPOC in the last 168 hours.  BNPNo results for input(s): BNP, PROBNP in the last 168 hours.  DDimer No results for input(s): DDIMER in the last 168 hours.  Radiology/Studies:  No results found.  Assessment and Plan:   1. HTN:  Does not restrict his salt .  Does not eat fast foods.   Does not eat an excessive amount of salty foods.  Started on Lisinopril 2 weeks ago  Will add HCTZ 25 mg and Kdur 10 meq a day   2.  Dyspnea with Exertion: Shortness of breath with exertion might just be related to his hypertension and obesity but he has a very strong family history of coronary artery disease.  We will get a [place] study for further evaluation.  We will also work on his blood pressure.  3. Obesity:    Advised him to cut his carbs.  Needs to exercise   We will see him in 4-6 weeks for follow-up office visit.   Signed, Mertie Moores, MD  06/02/2017 3:17 PM

## 2017-06-03 ENCOUNTER — Telehealth (HOSPITAL_COMMUNITY): Payer: Self-pay | Admitting: *Deleted

## 2017-06-03 NOTE — Telephone Encounter (Signed)
Patient given detailed instructions per Myocardial Perfusion Study Information Sheet for the test on 06/10/17. Patient notified to arrive 15 minutes early and that it is imperative to arrive on time for appointment to keep from having the test rescheduled.  If you need to cancel or reschedule your appointment, please call the office within 24 hours of your appointment. . Patient verbalized understanding. [name], Samuel [name]    

## 2017-06-04 ENCOUNTER — Telehealth: Payer: Self-pay

## 2017-06-04 DIAGNOSIS — R7989 Other specified abnormal findings of blood chemistry: Secondary | ICD-10-CM

## 2017-06-04 DIAGNOSIS — R945 Abnormal results of liver function studies: Principal | ICD-10-CM

## 2017-06-04 NOTE — Telephone Encounter (Signed)
-----   Message from Orlena Sheldon, PA-C sent at 06/03/2017  8:58 AM EST ----- Add subclinical hypothyroidism to problem list. Inform patient that some liver numbers were elevated.  Hepatitis panel was added and is negative.  Find out if he uses very much Tylenol or alcohol and document.  If using these, he needs to discontinue.  Place order for right upper quadrant ultrasound. Other labs are normal/ stable.

## 2017-06-04 NOTE — Telephone Encounter (Signed)
Patient states he uses tylenol on the regular as well as consume alcohol. Patient was advised to stop due to his liver test elevated . Patient is aware that the referral will be put in

## 2017-06-05 DIAGNOSIS — E038 Other specified hypothyroidism: Secondary | ICD-10-CM | POA: Insufficient documentation

## 2017-06-05 DIAGNOSIS — E039 Hypothyroidism, unspecified: Secondary | ICD-10-CM | POA: Insufficient documentation

## 2017-06-10 ENCOUNTER — Other Ambulatory Visit: Payer: Self-pay

## 2017-06-10 ENCOUNTER — Ambulatory Visit (HOSPITAL_BASED_OUTPATIENT_CLINIC_OR_DEPARTMENT_OTHER): Payer: BLUE CROSS/BLUE SHIELD

## 2017-06-10 ENCOUNTER — Ambulatory Visit (HOSPITAL_COMMUNITY): Payer: BLUE CROSS/BLUE SHIELD | Attending: Cardiology

## 2017-06-10 DIAGNOSIS — R0609 Other forms of dyspnea: Secondary | ICD-10-CM | POA: Diagnosis present

## 2017-06-10 DIAGNOSIS — E669 Obesity, unspecified: Secondary | ICD-10-CM

## 2017-06-10 DIAGNOSIS — I10 Essential (primary) hypertension: Secondary | ICD-10-CM | POA: Diagnosis not present

## 2017-06-10 DIAGNOSIS — R0600 Dyspnea, unspecified: Secondary | ICD-10-CM

## 2017-06-10 LAB — MYOCARDIAL PERFUSION IMAGING
LV dias vol: 140 mL (ref 62–150)
LV sys vol: 71 mL
Peak HR: 105 {beats}/min
RATE: 0.26
Rest HR: 78 {beats}/min
SDS: 2
SRS: 5
SSS: 7
TID: 1.12

## 2017-06-10 MED ORDER — TECHNETIUM TC 99M TETROFOSMIN IV KIT
10.9000 | PACK | Freq: Once | INTRAVENOUS | Status: AC | PRN
  Administered 2017-06-10: 10.9 via INTRAVENOUS
  Filled 2017-06-10: qty 11

## 2017-06-10 MED ORDER — TECHNETIUM TC 99M TETROFOSMIN IV KIT
32.6000 | PACK | Freq: Once | INTRAVENOUS | Status: AC | PRN
  Administered 2017-06-10: 32.6 via INTRAVENOUS
  Filled 2017-06-10: qty 33

## 2017-06-10 MED ORDER — PERFLUTREN LIPID MICROSPHERE
1.0000 mL | INTRAVENOUS | Status: AC | PRN
  Administered 2017-06-10: 2 mL via INTRAVENOUS

## 2017-06-10 MED ORDER — REGADENOSON 0.4 MG/5ML IV SOLN
0.4000 mg | Freq: Once | INTRAVENOUS | Status: AC
  Administered 2017-06-10: 0.4 mg via INTRAVENOUS

## 2017-06-11 ENCOUNTER — Encounter: Payer: Self-pay | Admitting: Physician Assistant

## 2017-06-11 ENCOUNTER — Telehealth: Payer: Self-pay | Admitting: Nurse Practitioner

## 2017-06-11 ENCOUNTER — Ambulatory Visit: Payer: BLUE CROSS/BLUE SHIELD | Admitting: Physician Assistant

## 2017-06-11 ENCOUNTER — Other Ambulatory Visit: Payer: Self-pay

## 2017-06-11 VITALS — BP 130/78 | HR 53 | Temp 97.6°F | Resp 16 | Ht 66.0 in | Wt 277.6 lb

## 2017-06-11 DIAGNOSIS — I10 Essential (primary) hypertension: Secondary | ICD-10-CM

## 2017-06-11 DIAGNOSIS — R945 Abnormal results of liver function studies: Secondary | ICD-10-CM

## 2017-06-11 DIAGNOSIS — L719 Rosacea, unspecified: Secondary | ICD-10-CM | POA: Diagnosis not present

## 2017-06-11 DIAGNOSIS — E039 Hypothyroidism, unspecified: Secondary | ICD-10-CM

## 2017-06-11 DIAGNOSIS — E038 Other specified hypothyroidism: Secondary | ICD-10-CM

## 2017-06-11 DIAGNOSIS — R0609 Other forms of dyspnea: Principal | ICD-10-CM

## 2017-06-11 DIAGNOSIS — R0600 Dyspnea, unspecified: Secondary | ICD-10-CM

## 2017-06-11 DIAGNOSIS — R7989 Other specified abnormal findings of blood chemistry: Secondary | ICD-10-CM

## 2017-06-11 DIAGNOSIS — R9389 Abnormal findings on diagnostic imaging of other specified body structures: Secondary | ICD-10-CM

## 2017-06-11 LAB — BASIC METABOLIC PANEL WITH GFR
BUN: 15 mg/dL (ref 7–25)
CO2: 29 mmol/L (ref 20–32)
Calcium: 10 mg/dL (ref 8.6–10.3)
Chloride: 98 mmol/L (ref 98–110)
Creat: 1.02 mg/dL (ref 0.70–1.25)
GFR, Est African American: 90 mL/min/{1.73_m2} (ref >=60)
GFR, Est Non African American: 78 mL/min/{1.73_m2} (ref >=60)
Glucose, Bld: 96 mg/dL (ref 65–99)
Potassium: 5.1 mmol/L (ref 3.5–5.3)
Sodium: 136 mmol/L (ref 135–146)

## 2017-06-11 LAB — EXTRA LAV TOP TUBE

## 2017-06-11 MED ORDER — DOXYCYCLINE HYCLATE 100 MG PO TABS: 100.0000 mg | ORAL_TABLET | Freq: Two times a day (BID) | ORAL | 0 refills | Status: DC

## 2017-06-11 MED ORDER — METRONIDAZOLE 1 % EX GEL: Freq: Every day | CUTANEOUS | 0 refills | Status: DC

## 2017-06-11 MED ORDER — ASPIRIN EC 81 MG PO TBEC: 81.0000 mg | DELAYED_RELEASE_TABLET | Freq: Every day | ORAL | Status: DC

## 2017-06-11 NOTE — Telephone Encounter (Signed)
Spoke with patient to review results of echo and myocardial perfusion study. I scheduled him for cardiac cath at [name] on Monday 12/31 with Dr. Saunders [name]. I scheduled patient for lab appointment tomorrow, 12/28 for pre-cath labs. I advised him to start Aspirin 81 mg today.  I reviewed the following instructions with him and he verbalized understanding. I advised him to call back with questions or concerns prior to Monday. He thanked me for the call.   @LOGO @  Samuel [name] [address], [place] [address] Dept: [phone] Loc: [phone]  Samuel [name]  06/11/2017  You are scheduled for a Cardiac Catheterization on Monday, December 31 with Dr. Harrell Gave [name].  1. Please arrive at the [facility] (Main Entrance A) at [facility]: [place], [place] [address] at 5:30 AM (two hours before your procedure to ensure your preparation). Free valet parking service is available.   Special note: Every effort is made to have your procedure done on time. Please understand that emergencies sometimes delay scheduled procedures.  2. Diet: Do not eat or drink anything after midnight prior to your procedure except sips of water to take medications.  3. Labs: You will need to have blood drawn on Friday, December 28 at [facility] at [facility]. [address]  Open: 7:30am - 5pm    Phone: [phone]. You do not need to be fasting.  4. Medication instructions in preparation for your procedure:   On the morning of your procedure, take your Aspirin and any morning medicines NOT listed above.  You may use sips of water.  5. Plan for one night stay--bring personal belongings. 6. Bring a current list of your medications and current insurance cards. 7. You MUST have a responsible person to drive you home. 8. Someone MUST be with you the first 24 hours after you arrive  home or your discharge will be delayed. 9. Please wear clothes that are easy to get on and off and wear slip-on shoes.  Thank you for allowing [place] to care for you!   -- [facility]

## 2017-06-11 NOTE — Progress Notes (Signed)
Patient ID: Samuel [name] MRN: [id number], DOB: 26-Jun-1955, 61 y.o. Date of Encounter: @DATE @  Chief Complaint:  Chief Complaint  Patient presents with  . 2 Week Follow-up    HPI: 61 y.o. year old male  presents with above.    05/27/2017: He presents as a new patient to establish care.  He is scheduled for right total knee replacement on Monday 06/01/2017 with Dr. Alvan Dame. However, he states that he already realizes that this is probably going to have to be postponed/rescheduled and I have told him today that it definitely will need to be.  I informed him to call [facility] and inform them of today's visit and plans and need to postpone surgery. In regards to the right total knee replacement he also says "I get myself all worked up-----I have heard so many stories of people having knee replacement surgery and that the first 20 days are hell"--- "and that he is concerned because he cannot take any type of opioids because they cause him itching so is concerned that he cannot take pain medicines and the amount of pain that he will have."  He reports that he had a cardiac catheterization about 10 years ago at [facility].  Says that he was told that there was some blockage but not significant enough to require stents.  However says "but that was 10 years ago so I do not know if things have changed since then ".  Says that recently when he walks to the mailbox and back, he is significantly short of breath.  States that this is new.  Names off activities that he used to be able to do-- with no shortness of breath / DOE.  Also discussed that his blood pressure is reading significantly high today.  Reports that in the past when he would be at work if he had a headache he would get the CNA at work to check his blood pressure and it would be high.  Says "then later the headache would be gone" /resolved and that he "would get his blood pressure rechecked and it would be okay." He has no other  known history of high blood pressure but also admits that he has never had a PCP until now.  Reports that he has been experiencing nausea and diarrhea for the past 3 days.  Has had no vomiting.  Says that about 3 days ago the vomiting was significant frequency.  Says that yesterday he did not eat and his stomach would feel upset but then nothing would really come out because he had not eaten.  States that he ate again last night and then did have diarrhea again this morning. Later, when it came time to get labs and I was wondering whether he was fasting, if he had had anything to eat today-- he says "A couple of pieces of cheese."  Reports that he quit smoking 20 years ago.  Reports that he has no other known medical history.   AT THAT OV:  Started Lisinopril 10 mg daily for blood pressure. Checked screening labs.  Was not fasting so did not check lipid panel. CBC was normal.  HIV was negative.  PSA was normal.  AST and ALT were elevated.  Hepatitis panel was added and was negative.  Right upper quadrant ultrasound was ordered.  Patient states that this is scheduled for tomorrow. Also placed referral to cardiology.  Had visit with Dr. Acie Fredrickson 06/02/17.  I reviewed that office note.  Blood  Pressure was still elevated at that visit.  Added HCTZ 25 mg daily and K-Dur 10 milliequivalent daily in addition to the lisinopril. Today patient reports that he also discussed diet and exercise a lot with Dr. Acie Fredrickson.  Pt states that he has cut out carbohydrates and is starting silver sneakers on January 1.  We also discussed the elevated LFTs. Patient reports that he was taking Tylenol every day because of his knee pain.  Sometimes was taking twice daily. He also reports that he was "drinking like a fish ".  Says that he was drinking about 10 beers every single day.  Says "that was his past time." Says that he has seen people who have stopped drinking cold [place] and knows that that will cause DTs.   Therefore did not stop cold [place].  Has gradually weaned off of the alcohol.  Is down to drinking just 1 or 2 beers in the evening.  Plans to completely stop this but did not want to cause DTs.   06/11/2017: Reports that he is taking the lisinopril 10 mg daily.  Also is taking the HCTZ 25 mg daily and K-Dur 10 milliequivalent daily. He states that he had the stress test yesterday.  Is scheduled for the right upper quadrant ultrasound tomorrow.  Scheduled to see Dr. Acie Fredrickson back for follow-up visit February 4.  States that he is supposed to go have fasting labs done that day. Reports that he has already lost about 10 pounds and has decreased 1-2  inches in his waist. Discussed that my concern of possible sleep apnea.  He states that he knows that he does snore loudly but has not been told of any periods of sounding like he stops breathing.  Says that he sleeps for about 2 hours and then gets up to take the dogs out and then sleeps another 2 hours and then gets up again. So discussed that since he is having so many lifestyle changes and is having weight reduction will wait to discuss testing for sleep apnea. Says that Dr. Acie Fredrickson also discussed that he may not even have to have knee surgery if he loses weight and this has really been a motivating for him. He reports that he has been noticing these bumps and red areas on his cheeks of his skin on his face.  No other specific concerns to address today.   Past Medical History:  Diagnosis Date  . HTN (hypertension)      Home Meds: Outpatient Medications Prior to Visit  Medication Sig Dispense Refill  . DIPHENHYDRAMINE-ACETAMINOPHEN PO Take by mouth. 25-500MQ FOR SLEEP AS NEEDED    . hydrochlorothiazide (HYDRODIURIL) 25 MG tablet Take 1 tablet (25 mg total) by mouth daily. 90 tablet 3  . Ibuprofen 200 MG CAPS Take 1 capsule by mouth daily.    Marland Kitchen lisinopril (PRINIVIL,ZESTRIL) 10 MG tablet Take 1 tablet (10 mg total) by mouth daily. 30 tablet 0  .  potassium chloride (K-DUR) 10 MEQ tablet Take 1 tablet (10 mEq total) by mouth daily. 90 tablet 3   No facility-administered medications prior to visit.     Allergies: No Known Allergies  Social History   Socioeconomic History  . Marital status: Married    Spouse name: Not on file  . Number of children: Not on file  . Years of education: Not on file  . Highest education level: Not on file  Social Needs  . Financial resource strain: Not on file  . Food insecurity - worry:  Not on file  . Food insecurity - inability: Not on file  . Transportation needs - medical: Not on file  . Transportation needs - non-medical: Not on file  Occupational History  . Not on file  Tobacco Use  . Smoking status: Never Smoker  . Smokeless tobacco: Never Used  Substance and Sexual Activity  . Alcohol use: No  . Drug use: No  . Sexual activity: Not on file  Other Topics Concern  . Not on file  Social History Narrative  . Not on file    Family History  Problem Relation Age of Onset  . Dementia Mother   . Heart attack Father   . CAD Brother      Review of Systems:  See HPI for pertinent ROS. All other ROS negative.    Physical Exam: Blood pressure 130/78, pulse (!) 53, temperature 97.6 F (36.4 C), temperature source Oral, resp. rate 16, height 5\' 6"  (1.676 m), weight 125.9 kg (277 lb 9.6 oz), SpO2 98 %., Body mass index is 44.81 kg/m. General: Obese WM. Appears in no acute distress. Neck: Supple. No thyromegaly. No lymphadenopathy. No carotid bruits (but large neck) Lungs: Clear bilaterally to auscultation without wheezes, rales, or rhonchi. Breathing is unlabored. Heart: RRR with S1 S2. No murmurs, rubs, or gallops. Abdomen: Soft, non-tender, non-distended with normoactive bowel sounds. No hepatomegaly. No rebound/guarding. Umbilical hernia. Very obese Abdomen. Musculoskeletal:  Strength and tone normal for age. Extremities/Skin: Warm and dry. No LE edema. Face: Cheeks with diffuse  erythema, papules. Neuro: Alert and oriented X 3. Moves all extremities spontaneously. Gait is normal. CNII-XII grossly in tact. Psych:  Responds to questions appropriately with a normal affect.     ASSESSMENT AND PLAN:  61 y.o. year old male with   1. Essential hypertension 06/11/2017: Continue lisinopril 10 mg daily.  Continue HCTZ 25 mg daily.  Continue potassium 10 mEq daily.  Lab to monitor. - BASIC METABOLIC PANEL WITH GFR  2. Subclinical hypothyroidism 06/11/2017: Checked TSH and free T4 05/27/17.  Recheck in 6 months.  3. Severe obesity (BMI >= 40) ([place]) 06/11/2017: He Is being extremely compliant with diet and exercise changes.  4. Elevated liver function tests 06/11/2017: Hepatitis panel was negative.  He reports that he was taking Tylenol 1-2 times every single day and was drinking about 10 beers per day.  Is totally stop the Tylenol and has been weaning off of the ----------alcohol.  Is scheduled for right upper quadrant ultrasound tomorrow. ---------Suspect that the elevated LFTs were secondary to the alcohol use so we will recheck these in the future once he is off alcohol.  5. Rosacea 06/11/2017: Add MetroGel and doxycycline to treat rosacea. - metroNIDAZOLE (METROGEL) 1 % gel; Apply topically daily.  Dispense: 45 g; Refill: 0 - doxycycline (VIBRA-TABS) 100 MG tablet; Take 1 tablet (100 mg total) by mouth 2 (two) times daily.  Dispense: 40 tablet; Refill: 0  6. Possible OSA 05/27/2017--Will discuss at future OV. He has very large neck, HTN, Obesity---- high risk for OSA---will discuss at future OV 06/11/2017: He is being extremely compliant with lifestyle changes and has made lots of changes to his diet and is starting to exercise.  7. Other Preventive Care---- 05/27/2017: --Will discuss FLP, Colonoscopy, Immunizations at future OV. 06/11/2017: He is doing a lipid panel on the day that he sees Dr. Acie Fredrickson on February 4. ---------------- I will discuss colorectal  cancer screening and immunizations at his next visit with me once these other issues get  controlled/stable.    Plan follow-up visit with me in about 6 weeks about 1 week after he sees Dr. Acie Fredrickson on February 4.    Samuel [name], [place], [facility] 06/11/2017 9:29 AM

## 2017-06-11 NOTE — Telephone Encounter (Signed)
-----   Message from Thayer Headings, MD sent at 06/11/2017  9:44 AM EST ----- Pt has segmental wall motion abnormalities.  This is worrisome for CAD.   He has a strong family hx of CAD He will need a cardiac cath . I will be able to call him tomorrow and discuss cath -  Please schedule him for a cath for next week.

## 2017-06-12 ENCOUNTER — Telehealth: Payer: Self-pay

## 2017-06-12 ENCOUNTER — Other Ambulatory Visit: Payer: BLUE CROSS/BLUE SHIELD | Admitting: *Deleted

## 2017-06-12 ENCOUNTER — Other Ambulatory Visit: Payer: Self-pay | Admitting: Cardiovascular Disease

## 2017-06-12 ENCOUNTER — Ambulatory Visit
Admission: RE | Admit: 2017-06-12 | Discharge: 2017-06-12 | Disposition: A | Discharge status: Home / Self Care | Payer: BLUE CROSS/BLUE SHIELD | Source: Ambulatory Visit | Attending: Physician Assistant | Admitting: Physician Assistant

## 2017-06-12 DIAGNOSIS — R7989 Other specified abnormal findings of blood chemistry: Secondary | ICD-10-CM

## 2017-06-12 DIAGNOSIS — R0609 Other forms of dyspnea: Principal | ICD-10-CM

## 2017-06-12 DIAGNOSIS — I10 Essential (primary) hypertension: Secondary | ICD-10-CM

## 2017-06-12 DIAGNOSIS — R0600 Dyspnea, unspecified: Secondary | ICD-10-CM

## 2017-06-12 DIAGNOSIS — R9389 Abnormal findings on diagnostic imaging of other specified body structures: Secondary | ICD-10-CM

## 2017-06-12 DIAGNOSIS — I200 Unstable angina: Secondary | ICD-10-CM

## 2017-06-12 DIAGNOSIS — R945 Abnormal results of liver function studies: Principal | ICD-10-CM

## 2017-06-12 DIAGNOSIS — E669 Obesity, unspecified: Secondary | ICD-10-CM

## 2017-06-12 NOTE — Telephone Encounter (Signed)
Left detailed message per DPR:  Patient contacted pre-catheterization at [facility] scheduled for:  06/15/2017 @ 0730 Verified arrival time and place:  NT @ 0530 Confirmed AM meds to be taken pre-cath with sip of water: Take ASA Hold HCTZ Patient must have responsible person to drive home post procedure and observe patient for 24 hours Addl concerns:  Left this nurse name and # for call back

## 2017-06-13 LAB — LIPID PANEL
Chol/HDL Ratio: 3.6 ratio (ref 0.0–5.0)
Cholesterol, Total: 176 mg/dL (ref 100–199)
HDL: 49 mg/dL
LDL Calculated: 112 mg/dL — ABNORMAL HIGH (ref 0–99)
Triglycerides: 73 mg/dL (ref 0–149)
VLDL Cholesterol Cal: 15 mg/dL (ref 5–40)

## 2017-06-13 LAB — BASIC METABOLIC PANEL WITH GFR
BUN/Creatinine Ratio: 13 (ref 10–24)
BUN: 12 mg/dL (ref 8–27)
CO2: 24 mmol/L (ref 20–29)
Calcium: 9.7 mg/dL (ref 8.6–10.2)
Chloride: 94 mmol/L — ABNORMAL LOW (ref 96–106)
Creatinine, Ser: 0.92 mg/dL (ref 0.76–1.27)
GFR calc Af Amer: 102 mL/min/1.73
GFR calc non Af Amer: 88 mL/min/1.73
Glucose: 108 mg/dL — ABNORMAL HIGH (ref 65–99)
Potassium: 4.6 mmol/L (ref 3.5–5.2)
Sodium: 134 mmol/L (ref 134–144)

## 2017-06-13 LAB — HEPATIC FUNCTION PANEL
ALT: 132 IU/L — ABNORMAL HIGH (ref 0–44)
AST: 61 IU/L — ABNORMAL HIGH (ref 0–40)
Albumin: 4.5 g/dL (ref 3.6–4.8)
Alkaline Phosphatase: 56 IU/L (ref 39–117)
Bilirubin Total: 0.6 mg/dL (ref 0.0–1.2)
Bilirubin, Direct: 0.2 mg/dL (ref 0.00–0.40)
Total Protein: 7.2 g/dL (ref 6.0–8.5)

## 2017-06-13 LAB — CBC WITH DIFFERENTIAL/PLATELET
Basophils Absolute: 0.1 x10E3/uL (ref 0.0–0.2)
Basos: 1 %
EOS (ABSOLUTE): 0.2 x10E3/uL (ref 0.0–0.4)
Eos: 3 %
Hematocrit: 42.9 % (ref 37.5–51.0)
Hemoglobin: 15 g/dL (ref 13.0–17.7)
Immature Grans (Abs): 0 x10E3/uL (ref 0.0–0.1)
Immature Granulocytes: 0 %
Lymphocytes Absolute: 2.2 x10E3/uL (ref 0.7–3.1)
Lymphs: 25 %
MCH: 31.4 pg (ref 26.6–33.0)
MCHC: 35 g/dL (ref 31.5–35.7)
MCV: 90 fL (ref 79–97)
Monocytes Absolute: 1 x10E3/uL — ABNORMAL HIGH (ref 0.1–0.9)
Monocytes: 11 %
Neutrophils Absolute: 5.4 x10E3/uL (ref 1.4–7.0)
Neutrophils: 60 %
Platelets: 291 x10E3/uL (ref 150–379)
RBC: 4.78 x10E6/uL (ref 4.14–5.80)
RDW: 13.1 % (ref 12.3–15.4)
WBC: 9 x10E3/uL (ref 3.4–10.8)

## 2017-06-13 LAB — PROTIME-INR
INR: 1.1 (ref 0.8–1.2)
Prothrombin Time: 11.2 s (ref 9.1–12.0)

## 2017-06-15 ENCOUNTER — Telehealth: Payer: Self-pay

## 2017-06-15 ENCOUNTER — Ambulatory Visit (HOSPITAL_COMMUNITY)
Admission: RE | Admit: 2017-06-15 | Discharge: 2017-06-15 | Disposition: A | Discharge status: Home / Self Care | Payer: BLUE CROSS/BLUE SHIELD | Source: Ambulatory Visit | Attending: Internal Medicine | Admitting: Internal Medicine

## 2017-06-15 ENCOUNTER — Encounter (HOSPITAL_COMMUNITY)
Admission: RE | Disposition: A | Discharge status: Home / Self Care | Payer: Self-pay | Source: Ambulatory Visit | Attending: Internal Medicine

## 2017-06-15 ENCOUNTER — Encounter (HOSPITAL_COMMUNITY): Payer: Self-pay | Admitting: Internal Medicine

## 2017-06-15 DIAGNOSIS — I2510 Atherosclerotic heart disease of native coronary artery without angina pectoris: Secondary | ICD-10-CM | POA: Diagnosis not present

## 2017-06-15 DIAGNOSIS — Z791 Long term (current) use of non-steroidal anti-inflammatories (NSAID): Secondary | ICD-10-CM | POA: Diagnosis not present

## 2017-06-15 DIAGNOSIS — R0600 Dyspnea, unspecified: Secondary | ICD-10-CM | POA: Diagnosis present

## 2017-06-15 DIAGNOSIS — Z6841 Body Mass Index (BMI) 40.0 and over, adult: Secondary | ICD-10-CM | POA: Insufficient documentation

## 2017-06-15 DIAGNOSIS — R0609 Other forms of dyspnea: Secondary | ICD-10-CM | POA: Diagnosis present

## 2017-06-15 DIAGNOSIS — Z8249 Family history of ischemic heart disease and other diseases of the circulatory system: Secondary | ICD-10-CM | POA: Insufficient documentation

## 2017-06-15 DIAGNOSIS — I10 Essential (primary) hypertension: Secondary | ICD-10-CM | POA: Diagnosis not present

## 2017-06-15 DIAGNOSIS — R9439 Abnormal result of other cardiovascular function study: Secondary | ICD-10-CM | POA: Diagnosis not present

## 2017-06-15 DIAGNOSIS — Z79899 Other long term (current) drug therapy: Secondary | ICD-10-CM | POA: Diagnosis not present

## 2017-06-15 DIAGNOSIS — E669 Obesity, unspecified: Secondary | ICD-10-CM | POA: Diagnosis not present

## 2017-06-15 DIAGNOSIS — R0602 Shortness of breath: Secondary | ICD-10-CM | POA: Diagnosis present

## 2017-06-15 DIAGNOSIS — I200 Unstable angina: Secondary | ICD-10-CM

## 2017-06-15 HISTORY — PX: LEFT HEART CATH AND CORONARY ANGIOGRAPHY: CATH118249

## 2017-06-15 SURGERY — LEFT HEART CATH AND CORONARY ANGIOGRAPHY
Anesthesia: LOCAL

## 2017-06-15 MED ORDER — SODIUM CHLORIDE 0.9% FLUSH: 3.0000 mL | INTRAVENOUS | Status: DC | PRN

## 2017-06-15 MED ORDER — HEPARIN (PORCINE) IN NACL 2-0.9 UNIT/ML-% IJ SOLN
INTRAMUSCULAR | Status: AC
  Filled 2017-06-15: qty 1000

## 2017-06-15 MED ORDER — LIDOCAINE HCL (PF) 1 % IJ SOLN
INTRAMUSCULAR | Status: AC
  Filled 2017-06-15: qty 30

## 2017-06-15 MED ORDER — HEPARIN (PORCINE) IN NACL 2-0.9 UNIT/ML-% IJ SOLN
INTRAMUSCULAR | Status: AC | PRN
  Administered 2017-06-15: 1000 mL

## 2017-06-15 MED ORDER — IOPAMIDOL (ISOVUE-370) INJECTION 76%
INTRAVENOUS | Status: DC | PRN
  Administered 2017-06-15: 60 mL via INTRA_ARTERIAL

## 2017-06-15 MED ORDER — HEPARIN SODIUM (PORCINE) 1000 UNIT/ML IJ SOLN
INTRAMUSCULAR | Status: AC
  Filled 2017-06-15: qty 1

## 2017-06-15 MED ORDER — VERAPAMIL HCL 2.5 MG/ML IV SOLN
INTRAVENOUS | Status: AC
  Filled 2017-06-15: qty 2

## 2017-06-15 MED ORDER — SODIUM CHLORIDE 0.9 % IV SOLN: 250.0000 mL | INTRAVENOUS | Status: DC | PRN

## 2017-06-15 MED ORDER — IOPAMIDOL (ISOVUE-370) INJECTION 76%
INTRAVENOUS | Status: AC
  Filled 2017-06-15: qty 100

## 2017-06-15 MED ORDER — SODIUM CHLORIDE 0.9 % WEIGHT BASED INFUSION
3.0000 mL/kg/h | INTRAVENOUS | Status: AC
  Administered 2017-06-15: 3 mL/kg/h via INTRAVENOUS

## 2017-06-15 MED ORDER — SODIUM CHLORIDE 0.9 % IV SOLN: INTRAVENOUS | Status: DC

## 2017-06-15 MED ORDER — SODIUM CHLORIDE 0.9% FLUSH: 3.0000 mL | Freq: Two times a day (BID) | INTRAVENOUS | Status: DC

## 2017-06-15 MED ORDER — ASPIRIN 81 MG PO CHEW: 81.0000 mg | CHEWABLE_TABLET | ORAL | Status: AC

## 2017-06-15 MED ORDER — MIDAZOLAM HCL 2 MG/2ML IJ SOLN
INTRAMUSCULAR | Status: AC
  Filled 2017-06-15: qty 2

## 2017-06-15 MED ORDER — HEPARIN SODIUM (PORCINE) 1000 UNIT/ML IJ SOLN
INTRAMUSCULAR | Status: DC | PRN
  Administered 2017-06-15: 5000 [IU] via INTRAVENOUS

## 2017-06-15 MED ORDER — LIDOCAINE HCL (PF) 1 % IJ SOLN
INTRAMUSCULAR | Status: DC | PRN
  Administered 2017-06-15: 2 mL

## 2017-06-15 MED ORDER — VERAPAMIL HCL 2.5 MG/ML IV SOLN
INTRAVENOUS | Status: DC | PRN
  Administered 2017-06-15: 08:00:00 via INTRA_ARTERIAL

## 2017-06-15 MED ORDER — SODIUM CHLORIDE 0.9 % WEIGHT BASED INFUSION: 1.0000 mL/kg/h | INTRAVENOUS | Status: DC

## 2017-06-15 MED ORDER — MIDAZOLAM HCL 2 MG/2ML IJ SOLN
INTRAMUSCULAR | Status: DC | PRN
  Administered 2017-06-15 (×2): 1 mg via INTRAVENOUS

## 2017-06-15 SURGICAL SUPPLY — 10 items

## 2017-06-15 NOTE — Brief Op Note (Signed)
BRIEF CARDIAC CATHETERIZATION NOTE  DATE: 06/15/2017 TIME: 8:04 AM  PATIENT:  Samuel [name]  61 y.o. male  PRE-OPERATIVE DIAGNOSIS:  Dyspnea on exertion, abnormal stress test  POST-OPERATIVE DIAGNOSIS:  Non-obstructive coronary artery disease  PROCEDURE:  Procedure(s): LEFT HEART CATH AND CORONARY ANGIOGRAPHY (N/A)  SURGEON:  Surgeon(s) and Role:    * End, [name], MD - Primary  FINDINGS: 1. Mild to moderate, non-obstructive coronary artery disease. 2. Normal left ventricular contraction. 3. Mildly elevated left ventricular filling pressure.  RECOMMENDATIONS: 1. Medical therapy and risk factor modification.  Nelva Bush, MD [facility] Pager: [phone]

## 2017-06-15 NOTE — Telephone Encounter (Signed)
Patient aware of u/s results

## 2017-06-15 NOTE — Interval H&P Note (Signed)
History and Physical Interval Note:  06/15/2017 7:22 AM  Lynden Ang  has presented today for cardiac catheterization, with the diagnosis of dyspnea on exertion and abnormal stress test. The various methods of treatment have been discussed with the patient and family. After consideration of risks, benefits and other options for treatment, the patient has consented to  Procedure(s): LEFT HEART CATH AND CORONARY ANGIOGRAPHY (N/A) as a surgical intervention .  The patient's history has been reviewed, patient examined, no change in status, stable for surgery.  I have reviewed the patient's chart and labs.  Questions were answered to the patient's satisfaction.    Cath Lab Visit (complete for each Cath Lab visit)  Clinical Evaluation Leading to the Procedure:   ACS: No.  Non-ACS:    Anginal Classification: CCS III  Anti-ischemic medical therapy: No Therapy  Non-Invasive Test Results: Low-risk stress test findings: cardiac mortality <1%/year  Prior CABG: No previous CABG  [name] End

## 2017-06-15 NOTE — Discharge Instructions (Signed)

## 2017-06-19 ENCOUNTER — Other Ambulatory Visit: Payer: Self-pay | Admitting: Physician Assistant

## 2017-06-19 DIAGNOSIS — I10 Essential (primary) hypertension: Secondary | ICD-10-CM

## 2017-06-19 NOTE — Telephone Encounter (Signed)
Refill appropriate 

## 2017-07-20 ENCOUNTER — Ambulatory Visit: Payer: BLUE CROSS/BLUE SHIELD | Admitting: Cardiovascular Disease

## 2017-07-20 ENCOUNTER — Encounter: Payer: Self-pay | Admitting: Cardiovascular Disease

## 2017-07-20 ENCOUNTER — Encounter (INDEPENDENT_AMBULATORY_CARE_PROVIDER_SITE_OTHER): Payer: Self-pay

## 2017-07-20 VITALS — BP 136/90 | HR 88 | Wt 275.0 lb

## 2017-07-20 DIAGNOSIS — I10 Essential (primary) hypertension: Secondary | ICD-10-CM | POA: Diagnosis not present

## 2017-07-20 DIAGNOSIS — R0609 Other forms of dyspnea: Secondary | ICD-10-CM | POA: Diagnosis not present

## 2017-07-20 DIAGNOSIS — R0600 Dyspnea, unspecified: Secondary | ICD-10-CM

## 2017-07-20 NOTE — Progress Notes (Signed)
Cardiology Admission History and Physical:   Patient ID: Samuel [name]; MRN: [id number]; DOB: Nov 05, 1955   Admission date: (Not on file)  Primary Care Provider: Rennis Golden Primary Cardiologist:  Nahser  Primary Electrophysiologist:    Problem List 1.  Dyspnea with exertion 2.  Essential HTN 3.   Obesity. 4.   Chief Complaint:   HTN Preoperative evaluation prior to knee replacement  Patient Profile:   Samuel [name] is a 62 y.o. male with a history of hypertension and shortness of breath.   Seeing him today for the first time for preoperative evaluation prior to knee replacement.  History of Present Illness:   Samuel [name] has had significant DOE  with exertion especially pronounced with the recent snow.  Has had some feet swelling . 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 4, 2019:    Samuel [name] was seen 6 weeks ago for increasing shortness of breath.  Myoview study was abnormal. Cardiac catheterization on December 31 reveals mild to moderate nonobstructive coronary artery disease.  His distal branches are small and diffusely diseased.    He has normal left ventricular systolic function.  Has lost 11 lbs.   Feeling better- has lost 4 pant sizes    Past Medical History:  Diagnosis Date  . HTN (hypertension)     Past Surgical History:  Procedure Laterality Date  . LEFT HEART CATH AND CORONARY ANGIOGRAPHY N/A 06/15/2017   Procedure: LEFT HEART CATH AND CORONARY ANGIOGRAPHY;  Surgeon: Nelva Bush, MD;  Location: [facility];  Service: Cardiovascular;  Laterality: N/A;     Medications Prior to Admission: Prior to Admission medications   Medication Sig Start Date End Date Taking? Authorizing Provider  DIPHENHYDRAMINE-ACETAMINOPHEN PO Take by mouth. 25-500MQ FOR SLEEP AS  NEEDED   Yes [provider]  Ibuprofen 200 MG CAPS Take 1 capsule by mouth daily.   Yes [provider]  lisinopril (PRINIVIL,ZESTRIL) 10 MG tablet Take 1 tablet (10 mg total) by mouth daily. 05/27/17  Yes Orlena Sheldon, PA-C     Allergies:    Allergies  Allergen Reactions  . Other     Opioids - "scratch my face off"     Social History:   Social History   Socioeconomic History  . Marital status: Married    Spouse name: Not on file  . Number of children: Not on file  . Years of education: Not on file  . Highest education level: Not on file  Social Needs  . Financial resource strain: Not on file  . Food insecurity - worry: Not on file  . Food insecurity - inability: Not on file  . Transportation needs - medical: Not on file  . Transportation needs - non-medical: Not on file  Occupational History  . Not on file  Tobacco Use  . Smoking status: Never Smoker  . Smokeless tobacco: Never Used  Substance and Sexual Activity  . Alcohol use: No  . Drug use: No  . Sexual activity: Not on file  Other Topics Concern  . Not on file  Social History Narrative  . Not on file    Family History:   The patient's family history includes CAD in his brother; Dementia in  his mother; Heart attack in his father.    ROS:  Noted in current history.  All other systems are negative.   Physical Exam: Blood pressure 136/90, pulse 88, weight 275 lb (124.7 kg), SpO2 97 %.  GEN:  Well nourished, well developed in no acute distress HEENT: Normal NECK: No JVD; No carotid bruits LYMPHATICS: No lymphadenopathy CARDIAC: RR, no murmurs, rubs, gallops RESPIRATORY:  Clear to auscultation without rales, wheezing or rhonchi  ABDOMEN: moderately obese.  MUSCULOSKELETAL:  No edema; No deformity  SKIN: Warm and dry NEUROLOGIC:  Alert and oriented x 3   EKG:  .   Relevant CV Studies: Cath ~2008 showed minor coronary artery irreg.    Laboratory Data:  Chemistry No results for  input(s): NA, K, CL, CO2, GLUCOSE, BUN, CREATININE, CALCIUM, GFRNONAA, GFRAA, ANIONGAP in the last 168 hours.  No results for input(s): PROT, ALBUMIN, AST, ALT, ALKPHOS, BILITOT in the last 168 hours. Hematology No results for input(s): WBC, RBC, HGB, HCT, MCV, MCH, MCHC, RDW, PLT in the last 168 hours. Cardiac EnzymesNo results for input(s): TROPONINI in the last 168 hours. No results for input(s): TROPIPOC in the last 168 hours.  BNPNo results for input(s): BNP, PROBNP in the last 168 hours.  DDimer No results for input(s): DDIMER in the last 168 hours.  Radiology/Studies:  No results found.  Assessment and Plan:   1. HTN:  BP is better.   Is losing weight. Continue meds He will follow up with his primary MD   2.  Dyspnea with Exertion:  EF is normal.  No significant CAD Likely due to obesity and deconditioning   3. Obesity:    Has lost 11 lbs .   Encouraged him to continue     Signed, Mertie Moores, MD  07/20/2017 9:49 AM

## 2017-07-20 NOTE — Patient Instructions (Signed)

## 2017-07-27 ENCOUNTER — Ambulatory Visit: Payer: BLUE CROSS/BLUE SHIELD | Admitting: Physician Assistant

## 2017-08-05 ENCOUNTER — Other Ambulatory Visit: Payer: Self-pay | Admitting: Physician Assistant

## 2017-08-05 DIAGNOSIS — I10 Essential (primary) hypertension: Secondary | ICD-10-CM

## 2017-08-06 ENCOUNTER — Other Ambulatory Visit: Payer: Self-pay | Admitting: Physician Assistant

## 2017-08-06 DIAGNOSIS — I10 Essential (primary) hypertension: Secondary | ICD-10-CM

## 2017-08-10 DIAGNOSIS — M179 Osteoarthritis of knee, unspecified: Secondary | ICD-10-CM | POA: Insufficient documentation

## 2017-08-10 DIAGNOSIS — M1710 Unilateral primary osteoarthritis, unspecified knee: Secondary | ICD-10-CM | POA: Insufficient documentation

## 2017-08-19 DIAGNOSIS — S82002A Unspecified fracture of left patella, initial encounter for closed fracture: Secondary | ICD-10-CM | POA: Diagnosis not present

## 2017-08-19 DIAGNOSIS — M23307 Other meniscus derangements, unspecified meniscus, left knee: Secondary | ICD-10-CM | POA: Diagnosis not present

## 2017-08-19 DIAGNOSIS — S83282A Other tear of lateral meniscus, current injury, left knee, initial encounter: Secondary | ICD-10-CM | POA: Diagnosis not present

## 2017-08-19 DIAGNOSIS — M1712 Unilateral primary osteoarthritis, left knee: Secondary | ICD-10-CM | POA: Diagnosis not present

## 2017-08-19 DIAGNOSIS — M25562 Pain in left knee: Secondary | ICD-10-CM | POA: Diagnosis not present

## 2017-08-19 DIAGNOSIS — S82092A Other fracture of left patella, initial encounter for closed fracture: Secondary | ICD-10-CM | POA: Diagnosis not present

## 2017-09-16 ENCOUNTER — Encounter: Payer: Self-pay | Admitting: Physician Assistant

## 2017-09-17 ENCOUNTER — Encounter: Payer: Self-pay | Admitting: Physician Assistant

## 2017-09-17 ENCOUNTER — Ambulatory Visit (INDEPENDENT_AMBULATORY_CARE_PROVIDER_SITE_OTHER): Payer: Medicare Other | Admitting: Physician Assistant

## 2017-09-17 VITALS — BP 132/74 | HR 81 | Temp 99.1°F | Resp 16 | Wt 271.4 lb

## 2017-09-17 DIAGNOSIS — J029 Acute pharyngitis, unspecified: Secondary | ICD-10-CM

## 2017-09-17 DIAGNOSIS — B9689 Other specified bacterial agents as the cause of diseases classified elsewhere: Secondary | ICD-10-CM

## 2017-09-17 DIAGNOSIS — F1011 Alcohol abuse, in remission: Secondary | ICD-10-CM | POA: Diagnosis not present

## 2017-09-17 DIAGNOSIS — J988 Other specified respiratory disorders: Secondary | ICD-10-CM

## 2017-09-17 MED ORDER — AZITHROMYCIN 250 MG PO TABS: ORAL_TABLET | ORAL | 0 refills | Status: DC

## 2017-09-17 NOTE — Progress Notes (Signed)
Patient ID: Samuel [name] CHECK MRN: [id number], DOB: Aug 20, 1955, 62 y.o. Date of Encounter: 09/17/2017, 4:27 PM    Chief Complaint:  Chief Complaint  Patient presents with  . Sore Throat  . Cough     HPI: 62 y.o. year old male presents with above.   Says symptoms started 10 days ago.  Says he has been at [facility], that is why he is just now coming in to be seen.  Just got out of [facility] 3 hours ago.  His wife accompanies him for OV today.  He says he tried to quit alcohol at home but got DTs so realized he had to go somewhere that could help him.   Reports that his throat has felt sore for 10 days. Says he has been "getting out the nastiest looking phlegm you've ever seen". Has chest congestion. No known fevers, chills.      Home Meds:   Outpatient Medications Prior to Visit  Medication Sig Dispense Refill  . amLODipine (NORVASC) 10 MG tablet Take 10 mg by mouth daily.    Marland Kitchen aspirin EC 81 MG tablet Take 1 tablet (81 mg total) by mouth daily.    Marland Kitchen dicyclomine (BENTYL) 20 MG tablet Take 20 mg by mouth 4 (four) times daily as needed for spasms.    Marland Kitchen doxycycline (VIBRA-TABS) 100 MG tablet Take 1 tablet (100 mg total) by mouth 2 (two) times daily. 40 tablet 0  . ibuprofen (ADVIL,MOTRIN) 600 MG tablet Take 600 mg by mouth 4 (four) times daily as needed.    Marland Kitchen lisinopril (PRINIVIL,ZESTRIL) 10 MG tablet TAKE 1 TABLET BY MOUTH DAILY (Patient taking differently: TAKE 1 TABLET BY MOUTH DAILY patient taking 40mg ) 30 tablet 0  . methocarbamol (ROBAXIN) 500 MG tablet Take 1,000 mg by mouth 4 (four) times daily.    . metroNIDAZOLE (METROGEL) 1 % gel Apply topically daily. 45 g 0  . thiamine 100 MG tablet Take 100 mg by mouth daily.    . traZODone (DESYREL) 50 MG tablet Take 50 mg by mouth at bedtime as needed for sleep.    . hydrochlorothiazide (HYDRODIURIL) 25 MG tablet Take 1 tablet (25 mg total) by mouth daily. 90 tablet 3  . lisinopril (PRINIVIL,ZESTRIL) 10 MG tablet TAKE 1  TABLET BY MOUTH DAILY 30 tablet 0  . potassium chloride (K-DUR) 10 MEQ tablet Take 1 tablet (10 mEq total) by mouth daily. 90 tablet 3   No facility-administered medications prior to visit.     Allergies:  Allergies  Allergen Reactions  . Other     Opioids - "scratch my face off"       Review of Systems: See HPI for pertinent ROS. All other ROS negative.    Physical Exam: Blood pressure 132/74, pulse 81, temperature 99.1 F (37.3 C), temperature source Oral, resp. rate 16, weight 123.1 kg (271 lb 6.4 oz), SpO2 98 %., Body mass index is 43.81 kg/m. General:  WM. Appears in no acute distress. HEENT: Normocephalic, atraumatic, eyes without discharge, sclera non-icteric, nares are without discharge. Bilateral auditory canals clear, TM's are without perforation, pearly grey and translucent with reflective cone of light bilaterally. Oral cavity moist, posterior pharynx with moderate erythema, no exudate. No peritonsillar abscess.  Neck: Supple. No thyromegaly. No lymphadenopathy. Lungs: Clear bilaterally to auscultation without wheezes, rales, or rhonchi. Breathing is unlabored. Heart: Regular rhythm. No murmurs, rubs, or gallops. Msk:  Strength and tone normal for age. Extremities/Skin: Warm and dry.  Neuro: Alert and oriented  X 3. Moves all extremities spontaneously. Gait is normal. CNII-XII grossly in tact. Psych:  Responds to questions appropriately with a normal affect.   Results for orders placed or performed in visit on 09/17/17  STREP GROUP A AG, W/REFLEX TO CULT  Result Value Ref Range   Streptococcus, Group A Screen (Direct) NONE DETECTED      ASSESSMENT AND PLAN:  62 y.o. year old male with  1. Bacterial respiratory infection He is to take abx as directed. F/U if symptoms do not resolve within one week after completion of abx. - azithromycin (ZITHROMAX) 250 MG tablet; Day 1: Take 2 daily.  Days 2 - 5: Take 1 daily.  Dispense: 6 tablet; Refill: 0  2. Alcohol abuse, in  remission   3. Sore throat - STREP GROUP A AG, W/REFLEX TO CULT   He is scheduling f/u OV for 30 minute slot in 1 -2 weeks--for routine f/u --- he has a lot going on, recent cardiac cath, etc.   Signed, [address] [place], [place], [facility] 09/17/2017 4:27 PM

## 2017-09-19 LAB — CULTURE, GROUP A STREP
MICRO NUMBER:: 90417609
SOURCE:: 0
SPECIMEN QUALITY:: ADEQUATE

## 2017-09-19 LAB — STREP GROUP A AG, W/REFLEX TO CULT: Streptococcus, Group A Screen (Direct): NOT DETECTED

## 2017-09-24 ENCOUNTER — Encounter: Payer: Self-pay | Admitting: Physician Assistant

## 2017-09-24 ENCOUNTER — Ambulatory Visit (INDEPENDENT_AMBULATORY_CARE_PROVIDER_SITE_OTHER): Payer: Medicare Other | Admitting: Physician Assistant

## 2017-09-24 VITALS — BP 110/70 | HR 64 | Temp 97.7°F | Resp 16 | Ht 66.0 in | Wt 266.8 lb

## 2017-09-24 DIAGNOSIS — R945 Abnormal results of liver function studies: Secondary | ICD-10-CM

## 2017-09-24 DIAGNOSIS — K429 Umbilical hernia without obstruction or gangrene: Secondary | ICD-10-CM

## 2017-09-24 DIAGNOSIS — L719 Rosacea, unspecified: Secondary | ICD-10-CM | POA: Diagnosis not present

## 2017-09-24 DIAGNOSIS — I10 Essential (primary) hypertension: Secondary | ICD-10-CM | POA: Diagnosis not present

## 2017-09-24 DIAGNOSIS — E038 Other specified hypothyroidism: Secondary | ICD-10-CM

## 2017-09-24 DIAGNOSIS — R7989 Other specified abnormal findings of blood chemistry: Secondary | ICD-10-CM

## 2017-09-24 DIAGNOSIS — F1011 Alcohol abuse, in remission: Secondary | ICD-10-CM | POA: Diagnosis not present

## 2017-09-24 DIAGNOSIS — E039 Hypothyroidism, unspecified: Secondary | ICD-10-CM | POA: Diagnosis not present

## 2017-09-24 MED ORDER — LOSARTAN POTASSIUM 50 MG PO TABS: 50.0000 mg | ORAL_TABLET | Freq: Every day | ORAL | 0 refills | Status: DC

## 2017-09-24 NOTE — Progress Notes (Addendum)
Patient ID: Samuel [name] MRN: [id number], DOB: May 07, 1956, 62 y.o. Date of Encounter: @DATE @  Chief Complaint:  Chief Complaint  Patient presents with  . Hospitalization Follow-up    HPI: 62 y.o. year old male  presents with above.    05/27/2017: He presents as a new patient to establish care.  He is scheduled for right total knee replacement on Monday 06/01/2017 with Dr. Alvan Dame. However, he states that he already realizes that this is probably going to have to be postponed/rescheduled and I have told him today that it definitely will need to be.  I informed him to call [facility] and inform them of today's visit and plans and need to postpone surgery. In regards to the right total knee replacement he also says "I get myself all worked up-----I have heard so many stories of people having knee replacement surgery and that the first 20 days are hell"--- "and that he is concerned because he cannot take any type of opioids because they cause him itching so is concerned that he cannot take pain medicines and the amount of pain that he will have."  He reports that he had a cardiac catheterization about 10 years ago at [facility].  Says that he was told that there was some blockage but not significant enough to require stents.  However says "but that was 10 years ago so I do not know if things have changed since then ".  Says that recently when he walks to the mailbox and back, he is significantly short of breath.  States that this is new.  Names off activities that he used to be able to do-- with no shortness of breath / DOE.  Also discussed that his blood pressure is reading significantly high today.  Reports that in the past when he would be at work if he had a headache he would get the CNA at work to check his blood pressure and it would be high.  Says "then later the headache would be gone" /resolved and that he "would get his blood pressure rechecked and it would be okay." He has no  other known history of high blood pressure but also admits that he has never had a PCP until now.  Reports that he has been experiencing nausea and diarrhea for the past 3 days.  Has had no vomiting.  Says that about 3 days ago the vomiting was significant frequency.  Says that yesterday he did not eat and his stomach would feel upset but then nothing would really come out because he had not eaten.  States that he ate again last night and then did have diarrhea again this morning. Later, when it came time to get labs and I was wondering whether he was fasting, if he had had anything to eat today-- he says "A couple of pieces of cheese."  Reports that he quit smoking 20 years ago.  Reports that he has no other known medical history.   AT THAT OV:  Started Lisinopril 10 mg daily for blood pressure. Checked screening labs.  Was not fasting so did not check lipid panel. CBC was normal.  HIV was negative.  PSA was normal.  AST and ALT were elevated.  Hepatitis panel was added and was negative.  Right upper quadrant ultrasound was ordered.  Patient states that this is scheduled for tomorrow. Also placed referral to cardiology.  Had visit with Dr. Acie Fredrickson 06/02/17.  I reviewed that office note.  Blood Pressure  was still elevated at that visit.  Added HCTZ 25 mg daily and K-Dur 10 milliequivalent daily in addition to the lisinopril. Today patient reports that he also discussed diet and exercise a lot with Dr. Acie Fredrickson.  Pt states that he has cut out carbohydrates and is starting silver sneakers on January 1.  We also discussed the elevated LFTs. Patient reports that he was taking Tylenol every day because of his knee pain.  Sometimes was taking twice daily. He also reports that he was "drinking like a fish ".  Says that he was drinking about 10 beers every single day.  Says "that was his past time." Says that he has seen people who have stopped drinking cold [place] and knows that that will cause DTs.   Therefore did not stop cold [place].  Has gradually weaned off of the alcohol.  Is down to drinking just 1 or 2 beers in the evening.  Plans to completely stop this but did not want to cause DTs.   06/11/2017: Reports that he is taking the lisinopril 10 mg daily.  Also is taking the HCTZ 25 mg daily and K-Dur 10 milliequivalent daily. He states that he had the stress test yesterday.  Is scheduled for the right upper quadrant ultrasound tomorrow.  Scheduled to see Dr. Acie Fredrickson back for follow-up visit February 4.  States that he is supposed to go have fasting labs done that day. Reports that he has already lost about 10 pounds and has decreased 1-2  inches in his waist. Discussed that my concern of possible sleep apnea.  He states that he knows that he does snore loudly but has not been told of any periods of sounding like he stops breathing.  Says that he sleeps for about 2 hours and then gets up to take the dogs out and then sleeps another 2 hours and then gets up again. So discussed that since he is having so many lifestyle changes and is having weight reduction will wait to discuss testing for sleep apnea. Says that Dr. Acie Fredrickson also discussed that he may not even have to have knee surgery if he loses weight and this has really been a motivating for him. He reports that he has been noticing these bumps and red areas on his cheeks of his skin on his face.  No other specific concerns to address today.   09/17/2017: Says symptoms started 10 days ago.  Says he has been at [facility], that is why he is just now coming in to be seen.  Just got out of [facility] 3 hours ago.  His wife accompanies him for OV today.  He says he tried to quit alcohol at home but got DTs so realized he had to go somewhere that could help him.   Reports that his throat has felt sore for 10 days. Says he has been "getting out the nastiest looking phlegm you've ever seen". Has chest congestion. No known fevers, chills.     At that OV--prescribed meds for his respiratory infection, recommended that he schedule a follow-up visit for [place] to update regarding his general medical care. He is scheduling f/u OV for 30 minute slot in 1 -2 weeks--for routine f/u --- he has a lot going on, recent cardiac cath, etc.   09/24/2017: He presents for that follow-up visit today. Talks about having pain in multiple areas including his knees his hands his back etc.  Mentions rheumatoid arthritis and asks if he should see a rheumatologist.  However also in that same breath, he references things that he knows he has put his body through and "being banged up".  Explained that rheumatoid arthritis is a different condition and that what he is referring to is osteoarthritis.  Discussed that for osteoarthritis there would not be a lot of additional treatment from a rheumatologist.  Then starts discussing his knees and says that he has appointment to see orthopedics on the 17th and says that he wants to do the injection series again if the orthopedic agrees with that.  Says that he is losing weight and is hoping that will help his knees.  He states that in regards to the alcohol that this Saturday will be day 30 of him being alcohol free.  Says that he and his wife are going to meetings every single day.  Says that his wife does not drink alcohol but that she is going with him everywhere he goes.  Says "he does not leave the house without her -- she is my body guard -- she makes sure I do not stop at a convenient store etc. ".  Says that for now he is going to the meetings every day and that he will be getting a sponsor soon.  Lifts his shirt and shows me large umbilical hernia and asks what he can do about that.  Says that he is concerned that it is going to cause problems one day.  He reports that he occasionally still has a cough.  Says that he will go all day without a cough and then all of a sudden will feel a tickle in his throat and then  starts coughing and cannot stop.   Says that since taking the antibiotic I gave him, that the phlegm color has changed but still has occasional episode of cough.  Today I reviewed Dr. Elmarie Shiley office note from 07/20/17.  Reviewed that patient had a Myoview study that was abnormal.  Cardiac catheterization on December 24 revealed mild to moderate nonobstructive CAD.  Distal branches are small and diffusely diseased.  Normal LV systolic function.  Today patient states "he pretty much told me I could just go back there on an as-needed basis ".  Today I reviewed that he had a lipid panel 06/12/17 that showed triglycerides 73, HDL 49, LDL 112. PSA was checked 05/2017 and was normal. Today I discussed that the only other cancer screening that is recommended is screening for colorectal cancer.  He does not want to proceed with a colonoscopy or any screening right now.  Says that prior to coming to see me recently he had not had any medical care in years and now all of a sudden has so much going on-- wants to put this on hold.  Today I also reviewed that at his initial visit with me I was concerned of possible sleep apnea.  He reports that his wife has told him that he snores but that when he was at [facility] they told him he did not snore there.  Also he is losing weight right now with diet and exercise.  Also, he reports that he does feel rested when he wakes up in the morning.  He wants to hold off on sleep study at this point.  No other concerns to address today.  Addendum Added 10/05/2017: Received note from [facility]. Date of encounter: 09/30/2017 Audiological Testing was performed.  They recommended certain hearing aids or reprogramming his current aids. He was going to check into insurance  coverage then f/u with them.   Past Medical History:  Diagnosis Date  . HTN (hypertension)      Home Meds: Outpatient Medications Prior to Visit  Medication Sig Dispense Refill  . amLODipine  (NORVASC) 10 MG tablet Take 10 mg by mouth daily.    Marland Kitchen aspirin EC 81 MG tablet Take 1 tablet (81 mg total) by mouth daily.    Marland Kitchen dicyclomine (BENTYL) 20 MG tablet Take 20 mg by mouth 4 (four) times daily as needed for spasms.    Marland Kitchen doxycycline (VIBRA-TABS) 100 MG tablet Take 1 tablet (100 mg total) by mouth 2 (two) times daily. 40 tablet 0  . ibuprofen (ADVIL,MOTRIN) 600 MG tablet Take 600 mg by mouth 4 (four) times daily as needed.    . methocarbamol (ROBAXIN) 500 MG tablet Take 1,000 mg by mouth 4 (four) times daily.    . metroNIDAZOLE (METROGEL) 1 % gel Apply topically daily. 45 g 0  . lisinopril (PRINIVIL,ZESTRIL) 10 MG tablet TAKE 1 TABLET BY MOUTH DAILY (Patient taking differently: TAKE 1 TABLET BY MOUTH DAILY patient taking 40mg ) 30 tablet 0  . hydrochlorothiazide (HYDRODIURIL) 25 MG tablet Take 1 tablet (25 mg total) by mouth daily. 90 tablet 3  . azithromycin (ZITHROMAX) 250 MG tablet Day 1: Take 2 daily.  Days 2 - 5: Take 1 daily. 6 tablet 0  . thiamine 100 MG tablet Take 100 mg by mouth daily.    . traZODone (DESYREL) 50 MG tablet Take 50 mg by mouth at bedtime as needed for sleep.     No facility-administered medications prior to visit.     Allergies:  Allergies  Allergen Reactions  . Other     Opioids - "scratch my face off"     Social History   Socioeconomic History  . Marital status: Married    Spouse name: Not on file  . Number of children: Not on file  . Years of education: Not on file  . Highest education level: Not on file  Occupational History  . Not on file  Social Needs  . Financial resource strain: Not on file  . Food insecurity:    Worry: Not on file    Inability: Not on file  . Transportation needs:    Medical: Not on file    Non-medical: Not on file  Tobacco Use  . Smoking status: Never Smoker  . Smokeless tobacco: Never Used  Substance and Sexual Activity  . Alcohol use: No  . Drug use: No  . Sexual activity: Not on file  Lifestyle  . Physical  activity:    Days per week: Not on file    Minutes per session: Not on file  . Stress: Not on file  Relationships  . Social connections:    Talks on phone: Not on file    Gets together: Not on file    Attends religious service: Not on file    Active member of club or organization: Not on file    Attends meetings of clubs or organizations: Not on file    Relationship status: Not on file  . Intimate partner violence:    Fear of current or ex partner: Not on file    Emotionally abused: Not on file    Physically abused: Not on file    Forced sexual activity: Not on file  Other Topics Concern  . Not on file  Social History Narrative  . Not on file    Family History  Problem Relation  Age of Onset  . Dementia Mother   . Heart attack Father   . CAD Brother      Review of Systems:  See HPI for pertinent ROS. All other ROS negative.    Physical Exam: Blood pressure 110/70, pulse 64, temperature 97.7 F (36.5 C), temperature source Oral, resp. rate 16, height 5\' 6"  (1.676 m), weight 121 kg (266 lb 12.8 oz), SpO2 99 %., Body mass index is 43.06 kg/m. General: Obese WM. Appears in no acute distress. Neck: Supple. No thyromegaly. No lymphadenopathy. No carotid bruits (but large neck) Lungs: Clear bilaterally to auscultation without wheezes, rales, or rhonchi. Breathing is unlabored. Heart: RRR with S1 S2. No murmurs, rubs, or gallops. Abdomen: Soft, non-tender, non-distended with normoactive bowel sounds. No hepatomegaly. No rebound/guarding. Large umbilical hernia. Very obese Abdomen. Musculoskeletal:  Strength and tone normal for age. Extremities/Skin: Warm and dry. No LE edema. Face: Cheeks with diffuse erythema, papules. Neuro: Alert and oriented X 3. Moves all extremities spontaneously. Gait is normal. CNII-XII grossly in tact. Psych:  Responds to questions appropriately with a normal affect.     ASSESSMENT AND PLAN:  62 y.o. year old male with    Umbilical hernia without  obstruction and without gangrene 09/24/2017:  I will place referral to general surgery for evaluation of umbilical hernia. - Ambulatory referral to General Surgery  Essential hypertension 09/24/2017:  I am concerned that his cough may be secondary to ACE inhibitor.  Will discontinue lisinopril.  Replace with losartan 50 mg daily.  Have him return for follow-up visit in 2 weeks to recheck BP and bmet after medication change.  Also at that time we will follow-up to see if his cough has resolved. - losartan (COZAAR) 50 MG tablet; Take 1 tablet (50 mg total) by mouth daily.  Dispense: 30 tablet; Refill: 0  Subclinical hypothyroidism 09/24/2017:  Lab 05/27/2017 showed elevated TSH but normal free T4.  We will recheck labs in 6 months.  Rosacea 09/24/2017:   At visit 06/11/2017 I was not aware of his significant alcohol use.  This was most likely the underlying cause of his rosacea.  Alcohol has now been discontinued.  At that visit I did prescribe MetroGel and doxycycline for rosacea.   Alcohol abuse, in remission 09/24/2017:  He recently had a stay at [facility].  He currently is going to meetings on a daily basis and will be getting a sponsor soon.  His wife is helping hold him accountable to make sure he does not return to alcohol.  Severe obesity (BMI >= 40) ([place]) 06/11/2017: He Is being extremely compliant with diet and exercise changes. 09/24/2017: Today I reviewed that at his initial visit with me 05/27/17 weight was 286.  Today is 266.  He has lost 20 pounds !!!   Elevated liver function tests 06/11/2017: Hepatitis panel was negative.  He reports that he was taking Tylenol 1-2 times every single day and was drinking about 10 beers per day.  Is totally stop the Tylenol and has been weaning off of the ----------alcohol.  Is scheduled for right upper quadrant ultrasound tomorrow. ---------Suspect that the elevated LFTs were secondary to the alcohol use so we will recheck these in the future  once he is off alcohol.    Possible OSA 05/27/2017--Will discuss at future OV. He has very large neck, HTN, Obesity---- high risk for OSA---will discuss at future OV 06/11/2017: He is being extremely compliant with lifestyle changes and has made lots of changes to his diet  and is starting to exercise. 09/24/2017: See HPI for details, update regarding this.  Will hold off on sleep study at this time.    Plan follow-up visit with me in 2 weeks   Signed, Olean Ree [place], [place], [facility] 09/24/2017 12:33 PM

## 2017-09-25 ENCOUNTER — Telehealth: Payer: Self-pay

## 2017-09-25 DIAGNOSIS — H9190 Unspecified hearing loss, unspecified ear: Secondary | ICD-10-CM

## 2017-09-25 DIAGNOSIS — S82009A Unspecified fracture of unspecified patella, initial encounter for closed fracture: Secondary | ICD-10-CM | POA: Insufficient documentation

## 2017-09-25 NOTE — Telephone Encounter (Signed)
Patient calling asking for a referral to an audiologist for a hearing test to get new hearing aids.Patient states he has met his insurance deducible and is eligible for new hearing aids. Pls advise

## 2017-09-27 NOTE — Telephone Encounter (Signed)
Okay to place audiololgy referral Put hearing aides, hearing loss as Dx

## 2017-09-29 NOTE — Telephone Encounter (Signed)
Referral placed & patient is aware.  

## 2017-09-30 DIAGNOSIS — S82002D Unspecified fracture of left patella, subsequent encounter for closed fracture with routine healing: Secondary | ICD-10-CM | POA: Diagnosis not present

## 2017-09-30 DIAGNOSIS — M1712 Unilateral primary osteoarthritis, left knee: Secondary | ICD-10-CM | POA: Diagnosis not present

## 2017-09-30 DIAGNOSIS — H903 Sensorineural hearing loss, bilateral: Secondary | ICD-10-CM | POA: Diagnosis not present

## 2017-09-30 DIAGNOSIS — M25562 Pain in left knee: Secondary | ICD-10-CM | POA: Diagnosis not present

## 2017-10-08 ENCOUNTER — Encounter: Payer: Self-pay | Admitting: Physician Assistant

## 2017-10-08 ENCOUNTER — Other Ambulatory Visit: Payer: Self-pay

## 2017-10-08 ENCOUNTER — Ambulatory Visit (INDEPENDENT_AMBULATORY_CARE_PROVIDER_SITE_OTHER): Payer: Medicare Other | Admitting: Physician Assistant

## 2017-10-08 VITALS — BP 120/72 | HR 78 | Temp 97.9°F | Ht 66.0 in | Wt 263.2 lb

## 2017-10-08 DIAGNOSIS — R945 Abnormal results of liver function studies: Secondary | ICD-10-CM | POA: Diagnosis not present

## 2017-10-08 DIAGNOSIS — J988 Other specified respiratory disorders: Secondary | ICD-10-CM | POA: Diagnosis not present

## 2017-10-08 DIAGNOSIS — L719 Rosacea, unspecified: Secondary | ICD-10-CM

## 2017-10-08 DIAGNOSIS — B9689 Other specified bacterial agents as the cause of diseases classified elsewhere: Secondary | ICD-10-CM | POA: Diagnosis not present

## 2017-10-08 DIAGNOSIS — F1011 Alcohol abuse, in remission: Secondary | ICD-10-CM

## 2017-10-08 DIAGNOSIS — I10 Essential (primary) hypertension: Secondary | ICD-10-CM | POA: Diagnosis not present

## 2017-10-08 DIAGNOSIS — E039 Hypothyroidism, unspecified: Secondary | ICD-10-CM | POA: Diagnosis not present

## 2017-10-08 DIAGNOSIS — R7989 Other specified abnormal findings of blood chemistry: Secondary | ICD-10-CM

## 2017-10-08 DIAGNOSIS — E038 Other specified hypothyroidism: Secondary | ICD-10-CM

## 2017-10-08 DIAGNOSIS — Z125 Encounter for screening for malignant neoplasm of prostate: Secondary | ICD-10-CM | POA: Diagnosis not present

## 2017-10-08 DIAGNOSIS — K420 Umbilical hernia with obstruction, without gangrene: Secondary | ICD-10-CM | POA: Diagnosis not present

## 2017-10-08 LAB — BASIC METABOLIC PANEL WITH GFR
BUN: 23 mg/dL (ref 7–25)
CO2: 30 mmol/L (ref 20–32)
Calcium: 9.5 mg/dL (ref 8.6–10.3)
Chloride: 102 mmol/L (ref 98–110)
Creat: 1.02 mg/dL (ref 0.70–1.25)
GFR, Est African American: 90 mL/min/{1.73_m2} (ref >=60)
GFR, Est Non African American: 78 mL/min/{1.73_m2} (ref >=60)
Glucose, Bld: 106 mg/dL — ABNORMAL HIGH (ref 65–99)
Potassium: 4.1 mmol/L (ref 3.5–5.3)
Sodium: 139 mmol/L (ref 135–146)

## 2017-10-08 LAB — EXTRA LAV TOP TUBE

## 2017-10-08 MED ORDER — LEVOFLOXACIN 750 MG PO TABS: 750.0000 mg | ORAL_TABLET | Freq: Every day | ORAL | 0 refills | Status: AC

## 2017-10-08 MED ORDER — HYDROCODONE-HOMATROPINE 5-1.5 MG/5ML PO SYRP: 5.0000 mL | ORAL_SOLUTION | Freq: Four times a day (QID) | ORAL | 0 refills | Status: DC | PRN

## 2017-10-08 NOTE — Progress Notes (Signed)
Patient ID: KRISTOPH SATTLER MRN: [id number], DOB: 01/25/1956, 62 y.o. Date of Encounter: @DATE @  Chief Complaint:  Chief Complaint  Patient presents with  . Hypertension    HPI: 62 y.o. year old male  presents with above.    05/27/2017: He presents as a new patient to establish care.  He is scheduled for right total knee replacement on Monday 06/01/2017 with Dr. Alvan Dame. However, he states that he already realizes that this is probably going to have to be postponed/rescheduled and I have told him today that it definitely will need to be.  I informed him to call [facility] and inform them of today's visit and plans and need to postpone surgery. In regards to the right total knee replacement he also says "I get myself all worked up-----I have heard so many stories of people having knee replacement surgery and that the first 20 days are hell"--- "and that he is concerned because he cannot take any type of opioids because they cause him itching so is concerned that he cannot take pain medicines and the amount of pain that he will have."  He reports that he had a cardiac catheterization about 10 years ago at [facility].  Says that he was told that there was some blockage but not significant enough to require stents.  However says "but that was 10 years ago so I do not know if things have changed since then ".  Says that recently when he walks to the mailbox and back, he is significantly short of breath.  States that this is new.  Names off activities that he used to be able to do-- with no shortness of breath / DOE.  Also discussed that his blood pressure is reading significantly high today.  Reports that in the past when he would be at work if he had a headache he would get the CNA at work to check his blood pressure and it would be high.  Says "then later the headache would be gone" /resolved and that he "would get his blood pressure rechecked and it would be okay." He has no other known  history of high blood pressure but also admits that he has never had a PCP until now.  Reports that he has been experiencing nausea and diarrhea for the past 3 days.  Has had no vomiting.  Says that about 3 days ago the vomiting was significant frequency.  Says that yesterday he did not eat and his stomach would feel upset but then nothing would really come out because he had not eaten.  States that he ate again last night and then did have diarrhea again this morning. Later, when it came time to get labs and I was wondering whether he was fasting, if he had had anything to eat today-- he says "A couple of pieces of cheese."  Reports that he quit smoking 20 years ago.  Reports that he has no other known medical history.   AT THAT OV:  Started Lisinopril 10 mg daily for blood pressure. Checked screening labs.  Was not fasting so did not check lipid panel. CBC was normal.  HIV was negative.  PSA was normal.  AST and ALT were elevated.  Hepatitis panel was added and was negative.  Right upper quadrant ultrasound was ordered.  Patient states that this is scheduled for tomorrow. Also placed referral to cardiology.  Had visit with Dr. Acie Fredrickson 06/02/17.  I reviewed that office note.  Blood Pressure was  still elevated at that visit.  Added HCTZ 25 mg daily and K-Dur 10 milliequivalent daily in addition to the lisinopril. Today patient reports that he also discussed diet and exercise a lot with Dr. Acie Fredrickson.  Pt states that he has cut out carbohydrates and is starting silver sneakers on January 1.  We also discussed the elevated LFTs. Patient reports that he was taking Tylenol every day because of his knee pain.  Sometimes was taking twice daily. He also reports that he was "drinking like a fish ".  Says that he was drinking about 10 beers every single day.  Says "that was his past time." Says that he has seen people who have stopped drinking cold [place] and knows that that will cause DTs.  Therefore did  not stop cold [place].  Has gradually weaned off of the alcohol.  Is down to drinking just 1 or 2 beers in the evening.  Plans to completely stop this but did not want to cause DTs.    06/11/2017: Reports that he is taking the lisinopril 10 mg daily.  Also is taking the HCTZ 25 mg daily and K-Dur 10 milliequivalent daily. He states that he had the stress test yesterday.  Is scheduled for the right upper quadrant ultrasound tomorrow.  Scheduled to see Dr. Acie Fredrickson back for follow-up visit February 4.  States that he is supposed to go have fasting labs done that day. Reports that he has already lost about 10 pounds and has decreased 1-2  inches in his waist. Discussed that my concern of possible sleep apnea.  He states that he knows that he does snore loudly but has not been told of any periods of sounding like he stops breathing.  Says that he sleeps for about 2 hours and then gets up to take the dogs out and then sleeps another 2 hours and then gets up again. So discussed that since he is having so many lifestyle changes and is having weight reduction will wait to discuss testing for sleep apnea. Says that Dr. Acie Fredrickson also discussed that he may not even have to have knee surgery if he loses weight and this has really been a motivating for him. He reports that he has been noticing these bumps and red areas on his cheeks of his skin on his face.  No other specific concerns to address today.    09/17/2017: Says symptoms started 10 days ago.  Says he has been at [facility], that is why he is just now coming in to be seen.  Just got out of [facility] 3 hours ago.  His wife accompanies him for OV today.  He says he tried to quit alcohol at home but got DTs so realized he had to go somewhere that could help him.   Reports that his throat has felt sore for 10 days. Says he has been "getting out the nastiest looking phlegm you've ever seen". Has chest congestion. No known fevers, chills.   At  that OV--prescribed meds for his respiratory infection, recommended that he schedule a follow-up visit for [place] to update regarding his general medical care. He is scheduling f/u OV for 30 minute slot in 1 -2 weeks--for routine f/u --- he has a lot going on, recent cardiac cath, etc.    09/24/2017: He presents for that follow-up visit today. Talks about having pain in multiple areas including his knees his hands his back etc.  Mentions rheumatoid arthritis and asks if he should see a  rheumatologist.  However also in that same breath, he references things that he knows he has put his body through and "being banged up".  Explained that rheumatoid arthritis is a different condition and that what he is referring to is osteoarthritis.  Discussed that for osteoarthritis there would not be a lot of additional treatment from a rheumatologist.  Then starts discussing his knees and says that he has appointment to see orthopedics on the 17th and says that he wants to do the injection series again if the orthopedic agrees with that.  Says that he is losing weight and is hoping that will help his knees.  He states that in regards to the alcohol that this Saturday will be day 30 of him being alcohol free.  Says that he and his wife are going to meetings every single day.  Says that his wife does not drink alcohol but that she is going with him everywhere he goes.  Says "he does not leave the house without her -- she is my body guard -- she makes sure I do not stop at a convenient store etc. ".  Says that for now he is going to the meetings every day and that he will be getting a sponsor soon.  Lifts his shirt and shows me large umbilical hernia and asks what he can do about that.  Says that he is concerned that it is going to cause problems one day.  He reports that he occasionally still has a cough.  Says that he will go all day without a cough and then all of a sudden will feel a tickle in his throat and then starts  coughing and cannot stop.   Says that since taking the antibiotic I gave him, that the phlegm color has changed but still has occasional episode of cough.  Today I reviewed Dr. Elmarie Shiley office note from 07/20/17.  Reviewed that patient had a Myoview study that was abnormal.  Cardiac catheterization on December 24 revealed mild to moderate nonobstructive CAD.  Distal branches are small and diffusely diseased.  Normal LV systolic function.  Today patient states "he pretty much told me I could just go back there on an as-needed basis ".  Today I reviewed that he had a lipid panel 06/12/17 that showed triglycerides 73, HDL 49, LDL 112. PSA was checked 05/2017 and was normal. Today I discussed that the only other cancer screening that is recommended is screening for colorectal cancer.  He does not want to proceed with a colonoscopy or any screening right now.  Says that prior to coming to see me recently he had not had any medical care in years and now all of a sudden has so much going on-- wants to put this on hold.  Today I also reviewed that at his initial visit with me I was concerned of possible sleep apnea.  He reports that his wife has told him that he snores but that when he was at [facility] they told him he did not snore there.  Also he is losing weight right now with diet and exercise.  Also, he reports that he does feel rested when he wakes up in the morning.  He wants to hold off on sleep study at this point.  No other concerns to address today.  Addendum Added 10/05/2017: Received note from [facility]. Date of encounter: 09/30/2017 Audiological Testing was performed.  They recommended certain hearing aids or reprogramming his current aids. He was going to check  into insurance coverage then f/u with them.   AT THAT OV ON 6/62/9476: Umbilical hernia----placed referral to general surgery Hypertension------- concerned that cough may be secondary to ACE inhibitor.     ---------------------------Discontinued lisinopril and replaced with losartan 50 mg daily.   ---------------------------Planned for him return in 2 weeks to recheck BP and BMET after med change.   ---------------------------Also with follow-up to see if cough resolved after med change.    10/08/2017: Today he reports that he did discontinue the lisinopril and did start the losartan 50 mg daily.   However, he reports that he continues to have the cough.  Says that he "seems to get the coughing spells at the most inconvenient times-- at times that he supposed to be quiet-- like when he is in church or sitting in [place] meeting."  He does have phlegm present.  Says that if he does blow his nose, that there is thick mucus there-- but mostly is not having much come out of his nose-- mostly is feeling drainage in his throat and chest. He has not heard yet regarding appointment with surgeon. He is glad that he has lost a few more pounds today even since his most recent visit. Says that he has stayed off of the alcohol.  Says that "his wife has backed off some".  Says that "this is a problem with me, not her", Says that he now has a sponsor and is continuing to go to the meetings every night.  Says "I am seeing too many positives to go back to ever drinking ".   "I realize I was living in such a haze ".  Also is feeling better and losing weight and knows that it is going to help improve his health in addition to improving other areas of his life.  He has no other specific concerns to address.    Past Medical History:  Diagnosis Date  . HTN (hypertension)      Home Meds: Outpatient Medications Prior to Visit  Medication Sig Dispense Refill  . amLODipine (NORVASC) 10 MG tablet Take 10 mg by mouth daily.    Marland Kitchen aspirin EC 81 MG tablet Take 1 tablet (81 mg total) by mouth daily.    Marland Kitchen dicyclomine (BENTYL) 20 MG tablet Take 20 mg by mouth 4 (four) times daily as needed for spasms.    Marland Kitchen doxycycline  (VIBRA-TABS) 100 MG tablet Take 1 tablet (100 mg total) by mouth 2 (two) times daily. 40 tablet 0  . hydrochlorothiazide (HYDRODIURIL) 25 MG tablet Take 1 tablet (25 mg total) by mouth daily. 90 tablet 3  . ibuprofen (ADVIL,MOTRIN) 600 MG tablet Take 600 mg by mouth 4 (four) times daily as needed.    Marland Kitchen losartan (COZAAR) 50 MG tablet Take 1 tablet (50 mg total) by mouth daily. 30 tablet 0  . methocarbamol (ROBAXIN) 500 MG tablet Take 1,000 mg by mouth 4 (four) times daily.    . metroNIDAZOLE (METROGEL) 1 % gel Apply topically daily. 45 g 0   No facility-administered medications prior to visit.     Allergies:  Allergies  Allergen Reactions  . Other     Opioids - "scratch my face off"     Social History   Socioeconomic History  . Marital status: Married    Spouse name: Not on file  . Number of children: Not on file  . Years of education: Not on file  . Highest education level: Not on file  Occupational History  . Not on  file  Social Needs  . Financial resource strain: Not on file  . Food insecurity:    Worry: Not on file    Inability: Not on file  . Transportation needs:    Medical: Not on file    Non-medical: Not on file  Tobacco Use  . Smoking status: Never Smoker  . Smokeless tobacco: Never Used  Substance and Sexual Activity  . Alcohol use: No  . Drug use: No  . Sexual activity: Not on file  Lifestyle  . Physical activity:    Days per week: Not on file    Minutes per session: Not on file  . Stress: Not on file  Relationships  . Social connections:    Talks on phone: Not on file    Gets together: Not on file    Attends religious service: Not on file    Active member of club or organization: Not on file    Attends meetings of clubs or organizations: Not on file    Relationship status: Not on file  . Intimate partner violence:    Fear of current or ex partner: Not on file    Emotionally abused: Not on file    Physically abused: Not on file    Forced sexual  activity: Not on file  Other Topics Concern  . Not on file  Social History Narrative  . Not on file    Family History  Problem Relation Age of Onset  . Dementia Mother   . Heart attack Father   . CAD Brother      Review of Systems:  See HPI for pertinent ROS. All other ROS negative.    Physical Exam: Blood pressure 120/72, pulse 78, temperature 97.9 F (36.6 C), temperature source Oral, height 5\' 6"  (1.676 m), weight 119.4 kg (263 lb 4 oz), SpO2 98 %., Body mass index is 43.06 kg/m. General: Obese WM. Appears in no acute distress. Neck: Supple. No thyromegaly. No lymphadenopathy. No carotid bruits (but large neck) Lungs: Clear bilaterally to auscultation without wheezes, rales, or rhonchi. Breathing is unlabored. Heart: RRR with S1 S2. No murmurs, rubs, or gallops. Abdomen: Soft, non-tender, non-distended with normoactive bowel sounds. No hepatomegaly. No rebound/guarding. Large umbilical hernia. Very obese Abdomen. Musculoskeletal:  Strength and tone normal for age. Extremities/Skin: Warm and dry. No LE edema. Face: Cheeks with diffuse erythema, papules. Neuro: Alert and oriented X 3. Moves all extremities spontaneously. Gait is normal. CNII-XII grossly in tact. Psych:  Responds to questions appropriately with a normal affect.     ASSESSMENT AND PLAN:  62 y.o. year old male with   Bacterial respiratory infection 10/08/2017:  He is to take Levaquin as directed.  Also can use Hycodan as needed for cough suppressant.  Will have him return for follow-up visit in 2 weeks.  Will make sure that this cough does completely resolve and follow-up other issues.   If things are stable at that visit----- then can start to lengthen out time between visits. - levofloxacin (LEVAQUIN) 750 MG tablet; Take 1 tablet (750 mg total) by mouth daily for 7 days.  Dispense: 7 tablet; Refill: 0 - HYDROcodone-homatropine (HYCODAN) 5-1.5 MG/5ML syrup; Take 5 mLs by mouth every 6 (six) hours as needed.   Dispense: 120 mL; Refill: 0   Essential hypertension 09/24/2017:  I am concerned that his cough may be secondary to ACE inhibitor.  Will discontinue lisinopril.  Replace with losartan 50 mg daily.  Have him return for follow-up visit in 2 weeks  to recheck BP and bmet after medication change.  Also at that time we will follow-up to see if his cough has resolved.  10/08/2017: BP controlled.  Continue current meds.  Check lab to monitor since changing ACE inhibitor to ARB at last visit. - BASIC METABOLIC PANEL WITH GFR   Umbilical hernia with obstruction, without gangrene AT visit 09/24/2017 I placed referral to general surgery. At  visit 10/08/2017 he reports that he has not yet heard about this appointment.  Suspect that it may just be taking some time to get this on the schedule will follow this up when he returns in 2 weeks to make sure this is scheduled/followed.    Subclinical hypothyroidism 10/08/2017:  Lab 05/27/2017 showed elevated TSH but normal free T4.  We will recheck labs in 6 months.--Due to recheck 11/2017  Rosacea 09/24/2017:   At visit 06/11/2017 I was not aware of his significant alcohol use.  This was most likely the underlying cause of his rosacea.  Alcohol has now been discontinued.  At that visit I did prescribe MetroGel and doxycycline for rosacea. 10/08/2017:  Cont alcohol abstinence.  Continue the Flagyl and doxycycline if needed.  Alcohol abuse, in remission 09/24/2017:  He recently had a stay at [facility].  He currently is going to meetings on a daily basis and will be getting a sponsor soon.  His wife is helping hold him accountable to make sure he does not return to alcohol. 10/08/2017: He continues to abstain from alcohol.  He now has a sponsor.  Continues to go to meetings every night.  Severe obesity (BMI >= 40) ([place]) 06/11/2017: He Is being extremely compliant with diet and exercise changes. 09/24/2017: Today I reviewed that at his initial visit with me 05/27/17  weight was 286.  Today is 266.  He has lost 20 pounds !!!   Elevated liver function tests 06/11/2017: Hepatitis panel was negative.  He reports that he was taking Tylenol 1-2 times every single day and was drinking about 10 beers per day.  Is totally stop the Tylenol and has been weaning off of the ----------alcohol.  Is scheduled for right upper quadrant ultrasound tomorrow. ---------Suspect that the elevated LFTs were secondary to the alcohol use so we will recheck these in the future once he is off alcohol.    Possible OSA 05/27/2017--Will discuss at future OV. He has very large neck, HTN, Obesity---- high risk for OSA---will discuss at future OV 06/11/2017: He is being extremely compliant with lifestyle changes and has made lots of changes to his diet and is starting to exercise. 09/24/2017: See HPI for details, update regarding this.  Will hold off on sleep study at this time.    Plan follow-up visit with me in 2 weeks   Signed, Olean Ree Leonidas, [place], [facility] 10/08/2017 9:28 AM

## 2017-10-12 ENCOUNTER — Other Ambulatory Visit: Payer: Self-pay

## 2017-10-12 DIAGNOSIS — J988 Other specified respiratory disorders: Secondary | ICD-10-CM

## 2017-10-12 DIAGNOSIS — B9689 Other specified bacterial agents as the cause of diseases classified elsewhere: Secondary | ICD-10-CM

## 2017-10-12 MED ORDER — HYDROCODONE-HOMATROPINE 5-1.5 MG/5ML PO SYRP: 5.0000 mL | ORAL_SOLUTION | Freq: Four times a day (QID) | ORAL | 0 refills | Status: DC | PRN

## 2017-10-12 NOTE — Telephone Encounter (Signed)
Pharmacy called hydrocodone is on backorder and rx can be sent to [facility]

## 2017-10-13 NOTE — Telephone Encounter (Signed)
Rx sent to [facility] on [address]. LVM for patient to make him aware

## 2017-10-15 ENCOUNTER — Telehealth: Payer: Self-pay | Admitting: Physician Assistant

## 2017-10-15 NOTE — Telephone Encounter (Signed)
Yes.  Can discontinue the losartan and return to lisinopril. Verify with patient his prior dose of lisinopril.--  I reviewed past medication lists--- it looks like it was a 10 mg  but verify with him that he was only taking 1 a day for 10 mg per day.

## 2017-10-15 NOTE — Telephone Encounter (Signed)
Patient called in stating that the losartan 50 mg has been causing him to have dizziness. Patient states that the levaquin, and hycodan has helped with his dry throat. He was wondering if he can go back on the Lisinopril? Please advise?

## 2017-10-19 NOTE — Telephone Encounter (Signed)
Call placed to patient to discuss medications lvmtrc

## 2017-10-21 ENCOUNTER — Ambulatory Visit: Payer: Self-pay | Admitting: Surgery

## 2017-10-21 DIAGNOSIS — K420 Umbilical hernia with obstruction, without gangrene: Secondary | ICD-10-CM | POA: Diagnosis not present

## 2017-10-21 NOTE — H&P (Signed)
Samuel [name] Documented: 10/21/2017 10:17 AM Location: [facility] Patient #: [id number] DOB: 1956/02/16 Married / Language: [name] / Race: White Male  History of Present Illness ([name]; 10/21/2017 10:37 AM) Patient words: CC: Hernia  This is a very nice 62 year old gentleman who presents with a chronically incarcerated umbilical hernia. He first noticed it about 1 year ago. It has increased in size since then and the overlying skin has turned red and started to discolor and thin. He has never tried to reduce it. It does not cause him any pain. Denies any nausea, distention or constipation. He has never had any abdominal surgeries. He has a history of hypertension. He is a former smoker and former alcoholic but having completed a month and rehabilitation is now 28 days sober. He had a heart catheter at the end of December which she reports was a false positive, review of the imaging shows a few low-grade stenoses, no intervention was performed.  The patient is a 62 year old male.   Past Surgical History [name], [place]; 10/21/2017 10:17 AM) Knee Surgery Bilateral. Tonsillectomy Vasectomy  Diagnostic Studies History [name], [name]; 10/21/2017 10:17 AM) Colonoscopy never  Allergies [name], [name]; 10/21/2017 10:18 AM) No Known Drug Allergies [10/21/2017]:  Medication History [name], [name]; 10/21/2017 10:22 AM) Norvasc (10MG  Tablet, Oral) Active. Aspirin (81MG  Tablet, Oral) Active. HydroCHLOROthiazide (25MG  Tablet, Oral) Active. Ibuprofen (600MG  Tablet, Oral) Active. Medications Reconciled  Social History [name], [name]; 10/21/2017 10:17 AM) Alcohol use Recently quit alcohol use. Caffeine use Carbonated beverages, Coffee. Illicit drug use Remotely quit drug use. Tobacco use Former smoker.  Family History [name], [place]; 10/21/2017 10:17 AM) Alcohol Abuse Mother.  Other Problems [name], [name]; 10/21/2017 10:17 AM) Alcohol  Abuse Anxiety Disorder Arthritis High blood pressure Umbilical Hernia Repair     Review of Systems ([name] [name]; 10/21/2017 10:17 AM) General Not Present- Appetite Loss, Chills, Fatigue, Fever, Night Sweats, Weight Gain and Weight Loss. Skin Not Present- Change in Wart/Mole, Dryness, Hives, Jaundice, New Lesions, Non-Healing Wounds, Rash and Ulcer. HEENT Present- Hearing Loss, Ringing in the Ears, Seasonal Allergies and Wears glasses/contact lenses. Not Present- Earache, Hoarseness, Nose Bleed, Oral Ulcers, Sinus Pain, Sore Throat, Visual Disturbances and Yellow Eyes. Respiratory Not Present- Bloody sputum, Chronic Cough, Difficulty Breathing, Snoring and Wheezing. Breast Not Present- Breast Mass, Breast Pain, Nipple Discharge and Skin Changes. Cardiovascular Not Present- Chest Pain, Difficulty Breathing Lying Down, Leg Cramps, Palpitations, Rapid Heart Rate, Shortness of Breath and Swelling of Extremities. Gastrointestinal Not Present- Abdominal Pain, Bloating, Bloody Stool, Change in Bowel Habits, Chronic diarrhea, Constipation, Difficulty Swallowing, Excessive gas, Gets full quickly at meals, Hemorrhoids, Indigestion, Nausea, Rectal Pain and Vomiting. Male Genitourinary Not Present- Blood in Urine, Change in Urinary Stream, Frequency, Impotence, Nocturia, Painful Urination, Urgency and Urine Leakage. Musculoskeletal Present- Joint Pain and Joint Stiffness. Not Present- Back Pain, Muscle Pain, Muscle Weakness and Swelling of Extremities. Neurological Present- Weakness. Not Present- Decreased Memory, Fainting, Headaches, Numbness, Seizures, Tingling, Tremor and Trouble walking. Psychiatric Present- Anxiety. Not Present- Bipolar, Change in Sleep Pattern, Depression, Fearful and Frequent crying.  Vitals ([name] [name]; 10/21/2017 10:18 AM) 10/21/2017 10:18 AM Weight: 258.5 lb Height: 67in Body Surface Area: 2.26 m Body Mass Index: 40.49 kg/m  Temp.: 97.22F  Pulse: 71  (Regular)  P.OX: 95% (Room air) BP: 110/70 (Sitting, Left Arm, Standard)      Physical Exam ([name]; 10/21/2017 10:37 AM)  The physical exam findings are as  follows: Note:Gen: alert and well appearing Eye: extraocular motion intact, no scleral icterus ENT: moist mucus membranes, dentition intact Neck: no mass or thyromegaly Chest: unlabored respirations, symmetrical air entry, clear bilaterally CV: regular rate and rhythm, no pedal edema Abdomen: soft, nontender, nondistended. No mass or organomegaly. Chronically incarcerated umbilical hernia was thinned overlying skin. Fascial defect is not palpable. MSK: strength symmetrical throughout, no deformity Neuro: grossly intact, normal gait Psych: normal mood and affect, appropriate insight Skin: warm and dry, no rash or lesion on limited exam    Assessment & Plan ([name]; 12/17/5327 92:42 AM)  UMBILICAL HERNIA, INCARCERATED (K42.0) Story: I recommended open repair, possibly with mesh depending on the size of the defect at surgery. Discussed risk of bleeding, infection, pain, scarring, intra-abdominal injury, hernia recurrence, heart attack, pneumonia, blood clots, stroke, wound problems. he expressed understanding. Questions were answered to his satisfaction. We will schedule for surgery in the next few weeks.

## 2017-10-21 NOTE — H&P (View-Only) (Signed)
Samuel [name] Documented: 10/21/2017 10:17 AM Location: [facility] Patient #: [id number] DOB: 1956/05/10 Married / Language: Samuel [name] / Race: White Male  History of Present Illness (Samuel A. Kae Heller MD; 10/21/2017 10:37 AM) Patient words: CC: Hernia  This is a very nice 62 year old gentleman who presents with a chronically incarcerated umbilical hernia. He first noticed it about 1 year ago. It has increased in size since then and the overlying skin has turned red and started to discolor and thin. He has never tried to reduce it. It does not cause him any pain. Denies any nausea, distention or constipation. He has never had any abdominal surgeries. He has a history of hypertension. He is a former smoker and former alcoholic but having completed a month and rehabilitation is now 65 days sober. He had a heart catheter at the end of December which she reports was a false positive, review of the imaging shows a few low-grade stenoses, no intervention was performed.  The patient is a 62 year old male.   Past Surgical History Samuel [name], Samuel [name]; 10/21/2017 10:17 AM) Knee Surgery Bilateral. Tonsillectomy Vasectomy  Diagnostic Studies History Samuel [name], Samuel [name]; 10/21/2017 10:17 AM) Colonoscopy never  Allergies Samuel [name], Samuel [name]; 10/21/2017 10:18 AM) No Known Drug Allergies [10/21/2017]:  Medication History Samuel [name], Samuel [name]; 10/21/2017 10:22 AM) Norvasc (10MG  Tablet, Oral) Active. Aspirin (81MG  Tablet, Oral) Active. HydroCHLOROthiazide (25MG  Tablet, Oral) Active. Ibuprofen (600MG  Tablet, Oral) Active. Medications Reconciled  Social History Samuel [name], Samuel [name]; 10/21/2017 10:17 AM) Alcohol use Recently quit alcohol use. Caffeine use Carbonated beverages, Coffee. Illicit drug use Remotely quit drug use. Tobacco use Former smoker.  Family History Samuel [name], Samuel [name]; 10/21/2017 10:17 AM) Alcohol Abuse Mother.  Other Problems Samuel [name], Samuel [name]; 10/21/2017 10:17 AM) Alcohol  Abuse Anxiety Disorder Arthritis High blood pressure Umbilical Hernia Repair     Review of Systems (Samuel [name] Samuel [name]; 10/21/2017 10:17 AM) General Not Present- Appetite Loss, Chills, Fatigue, Fever, Night Sweats, Weight Gain and Weight Loss. Skin Not Present- Change in Wart/Mole, Dryness, Hives, Jaundice, New Lesions, Non-Healing Wounds, Rash and Ulcer. HEENT Present- Hearing Loss, Ringing in the Ears, Seasonal Allergies and Wears glasses/contact lenses. Not Present- Earache, Hoarseness, Nose Bleed, Oral Ulcers, Sinus Pain, Sore Throat, Visual Disturbances and Yellow Eyes. Respiratory Not Present- Bloody sputum, Chronic Cough, Difficulty Breathing, Snoring and Wheezing. Breast Not Present- Breast Mass, Breast Pain, Nipple Discharge and Skin Changes. Cardiovascular Not Present- Chest Pain, Difficulty Breathing Lying Down, Leg Cramps, Palpitations, Rapid Heart Rate, Shortness of Breath and Swelling of Extremities. Gastrointestinal Not Present- Abdominal Pain, Bloating, Bloody Stool, Change in Bowel Habits, Chronic diarrhea, Constipation, Difficulty Swallowing, Excessive gas, Gets full quickly at meals, Hemorrhoids, Indigestion, Nausea, Rectal Pain and Vomiting. Male Genitourinary Not Present- Blood in Urine, Change in Urinary Stream, Frequency, Impotence, Nocturia, Painful Urination, Urgency and Urine Leakage. Musculoskeletal Present- Joint Pain and Joint Stiffness. Not Present- Back Pain, Muscle Pain, Muscle Weakness and Swelling of Extremities. Neurological Present- Weakness. Not Present- Decreased Memory, Fainting, Headaches, Numbness, Seizures, Tingling, Tremor and Trouble walking. Psychiatric Present- Anxiety. Not Present- Bipolar, Change in Sleep Pattern, Depression, Fearful and Frequent crying.  Vitals (Samuel [name] Samuel [name]; 10/21/2017 10:18 AM) 10/21/2017 10:18 AM Weight: 258.5 lb Height: 67in Body Surface Area: 2.26 m Body Mass Index: 40.49 kg/m  Temp.: 97.23F  Pulse: 71  (Regular)  P.OX: 95% (Room air) BP: 110/70 (Sitting, Left Arm, Standard)      Physical Exam (Samuel A. Kae Heller MD; 10/21/2017 10:37 AM)  The physical exam findings are as  follows: Note:Gen: alert and well appearing Eye: extraocular motion intact, no scleral icterus ENT: moist mucus membranes, dentition intact Neck: no mass or thyromegaly Chest: unlabored respirations, symmetrical air entry, clear bilaterally CV: regular rate and rhythm, no pedal edema Abdomen: soft, nontender, nondistended. No mass or organomegaly. Chronically incarcerated umbilical hernia was thinned overlying skin. Fascial defect is not palpable. MSK: strength symmetrical throughout, no deformity Neuro: grossly intact, normal gait Psych: normal mood and affect, appropriate insight Skin: warm and dry, no rash or lesion on limited exam    Assessment & Plan (Samuel A. Connor MD; 01/20/3816 71:16 AM)  UMBILICAL HERNIA, INCARCERATED (K42.0) Story: I recommended open repair, possibly with mesh depending on the size of the defect at surgery. Discussed risk of bleeding, infection, pain, scarring, intra-abdominal injury, hernia recurrence, heart attack, pneumonia, blood clots, stroke, wound problems. he expressed understanding. Questions were answered to his satisfaction. We will schedule for surgery in the next few weeks.

## 2017-10-22 ENCOUNTER — Ambulatory Visit (INDEPENDENT_AMBULATORY_CARE_PROVIDER_SITE_OTHER): Payer: Medicare Other | Admitting: Physician Assistant

## 2017-10-22 ENCOUNTER — Encounter: Payer: Self-pay | Admitting: Physician Assistant

## 2017-10-22 VITALS — BP 99/60 | HR 86 | Temp 97.6°F | Resp 20 | Ht 66.0 in | Wt 258.8 lb

## 2017-10-22 DIAGNOSIS — I10 Essential (primary) hypertension: Secondary | ICD-10-CM

## 2017-10-22 NOTE — Progress Notes (Signed)
Patient ID: Samuel [name] MRN: [id number], DOB: 1955/10/25, 62 y.o. Date of Encounter: @DATE @  Chief Complaint:  No chief complaint on file.   HPI: 61 y.o. year old male  presents with above.    05/27/2017: He presents as a new patient to establish care.  He is scheduled for right total knee replacement on Monday 06/01/2017 with Dr. Alvan Dame. However, he states that he already realizes that this is probably going to have to be postponed/rescheduled and I have told him today that it definitely will need to be.  I informed him to call [facility] and inform them of today's visit and plans and need to postpone surgery. In regards to the right total knee replacement he also says "I get myself all worked up-----I have heard so many stories of people having knee replacement surgery and that the first 20 days are hell"--- "and that he is concerned because he cannot take any type of opioids because they cause him itching so is concerned that he cannot take pain medicines and the amount of pain that he will have."  He reports that he had a cardiac catheterization about 10 years ago at [facility].  Says that he was told that there was some blockage but not significant enough to require stents.  However says "but that was 10 years ago so I do not know if things have changed since then ".  Says that recently when he walks to the mailbox and back, he is significantly short of breath.  States that this is new.  Names off activities that he used to be able to do-- with no shortness of breath / DOE.  Also discussed that his blood pressure is reading significantly high today.  Reports that in the past when he would be at work if he had a headache he would get the CNA at work to check his blood pressure and it would be high.  Says "then later the headache would be gone" /resolved and that he "would get his blood pressure rechecked and it would be okay." He has no other known history of high blood pressure  but also admits that he has never had a PCP until now.  Reports that he has been experiencing nausea and diarrhea for the past 3 days.  Has had no vomiting.  Says that about 3 days ago the vomiting was significant frequency.  Says that yesterday he did not eat and his stomach would feel upset but then nothing would really come out because he had not eaten.  States that he ate again last night and then did have diarrhea again this morning. Later, when it came time to get labs and I was wondering whether he was fasting, if he had had anything to eat today-- he says "A couple of pieces of cheese."  Reports that he quit smoking 20 years ago.  Reports that he has no other known medical history.   AT THAT OV:  Started Lisinopril 10 mg daily for blood pressure. Checked screening labs.  Was not fasting so did not check lipid panel. CBC was normal.  HIV was negative.  PSA was normal.  AST and ALT were elevated.  Hepatitis panel was added and was negative.  Right upper quadrant ultrasound was ordered.  Patient states that this is scheduled for tomorrow. Also placed referral to Cardiology.  Had visit with Dr. Acie Fredrickson 06/02/17.  I reviewed that office note.  Blood Pressure was still elevated at that visit.  Added HCTZ 25 mg daily and K-Dur 10 milliequivalent daily in addition to the lisinopril. Today patient reports that he also discussed diet and exercise a lot with Dr. Acie Fredrickson.  Pt states that he has cut out carbohydrates and is starting silver sneakers on January 1.  We also discussed the elevated LFTs. Patient reports that he was taking Tylenol every day because of his knee pain.  Sometimes was taking twice daily. He also reports that he was "drinking like a fish ".  Says that he was drinking about 10 beers every single day.  Says "that was his past time." Says that he has seen people who have stopped drinking cold [place] and knows that that will cause DTs.  Therefore did not stop cold [place].  Has  gradually weaned off of the alcohol.  Is down to drinking just 1 or 2 beers in the evening.  Plans to completely stop this but did not want to cause DTs.    06/11/2017: Reports that he is taking the lisinopril 10 mg daily.  Also is taking the HCTZ 25 mg daily and K-Dur 10 milliequivalent daily. He states that he had the stress test yesterday.  Is scheduled for the right upper quadrant ultrasound tomorrow.  Scheduled to see Dr. Acie Fredrickson back for follow-up visit February 4.  States that he is supposed to go have fasting labs done that day. Reports that he has already lost about 10 pounds and has decreased 1-2  inches in his waist. Discussed that my concern of possible sleep apnea.  He states that he knows that he does snore loudly but has not been told of any periods of sounding like he stops breathing.  Says that he sleeps for about 2 hours and then gets up to take the dogs out and then sleeps another 2 hours and then gets up again. So discussed that since he is having so many lifestyle changes and is having weight reduction will wait to discuss testing for sleep apnea. Says that Dr. Acie Fredrickson also discussed that he may not even have to have knee surgery if he loses weight and this has really been a motivating for him. He reports that he has been noticing these bumps and red areas on his cheeks of his skin on his face.  No other specific concerns to address today.    09/17/2017: Says symptoms started 10 days ago.  Says he has been at [facility], that is why he is just now coming in to be seen.  Just got out of [facility] 3 hours ago.  His wife accompanies him for OV today.  He says he tried to quit alcohol at home but got DTs so realized he had to go somewhere that could help him.   Reports that his throat has felt sore for 10 days. Says he has been "getting out the nastiest looking phlegm you've ever seen". Has chest congestion. No known fevers, chills.   At that OV--prescribed meds for  his respiratory infection, recommended that he schedule a follow-up visit for [place] to update regarding his general medical care. He is scheduling f/u OV for 30 minute slot in 1 -2 weeks--for routine f/u --- he has a lot going on, recent cardiac cath, etc.    09/24/2017: He presents for that follow-up visit today. Talks about having pain in multiple areas including his knees his hands his back etc.  Mentions rheumatoid arthritis and asks if he should see a rheumatologist.  However also in that  same breath, he references things that he knows he has put his body through and "being banged up".  Explained that rheumatoid arthritis is a different condition and that what he is referring to is osteoarthritis.  Discussed that for osteoarthritis there would not be a lot of additional treatment from a rheumatologist.  Then starts discussing his knees and says that he has appointment to see orthopedics on the 17th and says that he wants to do the injection series again if the orthopedic agrees with that.  Says that he is losing weight and is hoping that will help his knees.  He states that in regards to the alcohol that this Saturday will be day 30 of him being alcohol free.  Says that he and his wife are going to meetings every single day.  Says that his wife does not drink alcohol but that she is going with him everywhere he goes.  Says "he does not leave the house without her -- she is my body guard -- she makes sure I do not stop at a convenient store etc. ".  Says that for now he is going to the meetings every day and that he will be getting a sponsor soon.  Lifts his shirt and shows me large umbilical hernia and asks what he can do about that.  Says that he is concerned that it is going to cause problems one day.  He reports that he occasionally still has a cough.  Says that he will go all day without a cough and then all of a sudden will feel a tickle in his throat and then starts coughing and cannot stop.     Says that since taking the antibiotic I gave him, that the phlegm color has changed but still has occasional episode of cough.  Today I reviewed Dr. Elmarie Shiley office note from 07/20/17.  Reviewed that patient had a Myoview study that was abnormal.  Cardiac catheterization on December 24 revealed mild to moderate nonobstructive CAD.  Distal branches are small and diffusely diseased.  Normal LV systolic function.  Today patient states "he pretty much told me I could just go back there on an as-needed basis ".  Today I reviewed that he had a lipid panel 06/12/17 that showed triglycerides 73, HDL 49, LDL 112. PSA was checked 05/2017 and was normal. Today I discussed that the only other cancer screening that is recommended is screening for colorectal cancer.  He does not want to proceed with a colonoscopy or any screening right now.  Says that prior to coming to see me recently he had not had any medical care in years and now all of a sudden has so much going on-- wants to put this on hold.  Today I also reviewed that at his initial visit with me I was concerned of possible sleep apnea.  He reports that his wife has told him that he snores but that when he was at [facility] they told him he did not snore there.  Also he is losing weight right now with diet and exercise.  Also, he reports that he does feel rested when he wakes up in the morning.  He wants to hold off on sleep study at this point.  No other concerns to address today.  Addendum Added 10/05/2017: Received note from [facility]. Date of encounter: 09/30/2017 Audiological Testing was performed.  They recommended certain hearing aids or reprogramming his current aids. He was going to check into insurance coverage then f/u  with them.   AT THAT OV ON 07/16/8655: Umbilical hernia----placed referral to general surgery Hypertension------- concerned that cough may be secondary to ACE inhibitor.   ---------------------------Discontinued  lisinopril and replaced with losartan 50 mg daily.   ---------------------------Planned for him return in 2 weeks to recheck BP and BMET after med change.   ---------------------------Also with follow-up to see if cough resolved after med change.    10/08/2017: Today he reports that he did discontinue the lisinopril and did start the losartan 50 mg daily.   However, he reports that he continues to have the cough.  Says that he "seems to get the coughing spells at the most inconvenient times-- at times that he supposed to be quiet-- like when he is in church or sitting in [place] meeting."  He does have phlegm present.  Says that if he does blow his nose, that there is thick mucus there-- but mostly is not having much come out of his nose-- mostly is feeling drainage in his throat and chest. He has not heard yet regarding appointment with surgeon. He is glad that he has lost a few more pounds today even since his most recent visit. Says that he has stayed off of the alcohol.  Says that "his wife has backed off some".  Says that "this is a problem with me, not her", Says that he now has a sponsor and is continuing to go to the meetings every night.  Says "I am seeing too many positives to go back to ever drinking ".   "I realize I was living in such a haze ".  Also is feeling better and losing weight and knows that it is going to help improve his health in addition to improving other areas of his life.  He has no other specific concerns to address. AT THAT OV: --Prescribed Levaquin. And Hycodan. --Continued current BP meds. Checked lab to monitor since changed ACE Inh to ARB at [place]   10/22/2017:  Today I reviewed phone notes that are documented since last visit. On 10/15/2017 patient called in stating that the losartan 50 mg was causing him to have dizziness.   Reported that the Levaquin and Hycodan had helped with his cough.  Was wondering if he could go back to the lisinopril. I replied that --yes-- he  could discontinue the losartan and return to the lisinopril.  Today he reports: Today he reports that when he was on the losartan he had some episodes of feeling lightheaded like he was going to pass out.  Says "it could have had to do with what I was doing though ".  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  Had to sit down and drink some water.  However, he states "that pill made me feel bad so I stopped it and went back to the lisinopril. I would rather have a cough." Today we reviewed all the other things that have been changes that have been occurring in his body in addition to the changes with the blood pressure medicines.   That he has lost almost 30 pounds in just 5 months.   He brings up his weight loss during the visit and is excited that he has had to buy new clothes because the other ones are too big.   Asked what he had  changed other than stopping the alcohol.   His response is "the all-you-can-eat [place] buffet ".   Says "now he is eating low to no carbs".   "Now he is eating small portions instead of eating until I feel full ".   Is pretty much cut out potato pasta rice.   Says but I do drink [place] coffee in the morning-- he says that there are 4 teaspoons of sugar and espresso.  Says that he is getting all that sugar at that point right in the morning but otherwise very little carbohydrates or sugar.  He reports that he did have visit with the surgeon and I noted that I did get note from them and they are going to proceed with repair to his umbilical hernia.  He states that the surgery scheduler has been calling him but they "are playing phone tag" and he will make sure to talk to them today.  Reports that even though he has had these couple episodes of lightheadedness when working in the heat that he has continued to  get good blood pressure readings at home.  Says that even this morning got reading of 137/70.  However, notes that when he did go to the surgeon's office they did mention getting 284 systolic reading.  Reports that since he took the Levaquin all of his phlegm has resolved and cough has resolved.  Also states that regarding his left knee-- he is going to start the viscous supplement injections-- for a set of 3-- and will start at the end of this month ".   Says "after he does the series of 3 to the left knee then going to do the right one ".  He is staying off of the alcohol.  Is continuing to go to the meetings etc.  No other concerns to address today.   Past Medical History:  Diagnosis Date  . HTN (hypertension)      Home Meds: Outpatient Medications Prior to Visit  Medication Sig Dispense Refill  . amLODipine (NORVASC) 10 MG tablet Take 10 mg by mouth daily.    Marland Kitchen aspirin EC 81 MG tablet Take 1 tablet (81 mg total) by mouth daily.    Marland Kitchen dicyclomine (BENTYL) 20 MG tablet Take 20 mg by mouth 4 (four) times daily as needed for spasms.    . hydroCHLOROthiazide (MICROZIDE PO) hydrochlorothiazide    . ibuprofen (ADVIL,MOTRIN) 600 MG tablet Take 600 mg by mouth 4 (four) times daily as needed.    Marland Kitchen lisinopril (PRINIVIL,ZESTRIL) 10 MG tablet Take 40 mg by mouth daily.    . methocarbamol (ROBAXIN) 500 MG tablet Take 1,000 mg by mouth 4 (four) times daily.    . metroNIDAZOLE (METROGEL) 1 % gel Apply topically daily. 45 g 0  . losartan (COZAAR) 50 MG tablet Take 1 tablet (50 mg total) by mouth daily. 30 tablet 0  . hydrochlorothiazide (HYDRODIURIL) 25 MG tablet Take 1 tablet (25 mg total) by mouth daily. 90 tablet 3  . HYDROcodone-homatropine (HYCODAN) 5-1.5 MG/5ML syrup Take 5 mLs by mouth every 6 (six) hours as needed. (Patient not taking: Reported on 10/22/2017) 120 mL 0   No facility-administered medications prior to visit.     Allergies:  Allergies  Allergen Reactions  . Other      Opioids - "scratch my face off"     Social History   Socioeconomic History  . Marital status: Married    Spouse name: Not on file  . Number of children: Not on  file  . Years of education: Not on file  . Highest education level: Not on file  Occupational History  . Not on file  Social Needs  . Financial resource strain: Not on file  . Food insecurity:    Worry: Not on file    Inability: Not on file  . Transportation needs:    Medical: Not on file    Non-medical: Not on file  Tobacco Use  . Smoking status: Never Smoker  . Smokeless tobacco: Never Used  Substance and Sexual Activity  . Alcohol use: No  . Drug use: No  . Sexual activity: Not on file  Lifestyle  . Physical activity:    Days per week: Not on file    Minutes per session: Not on file  . Stress: Not on file  Relationships  . Social connections:    Talks on phone: Not on file    Gets together: Not on file    Attends religious service: Not on file    Active member of club or organization: Not on file    Attends meetings of clubs or organizations: Not on file    Relationship status: Not on file  . Intimate partner violence:    Fear of current or ex partner: Not on file    Emotionally abused: Not on file    Physically abused: Not on file    Forced sexual activity: Not on file  Other Topics Concern  . Not on file  Social History Narrative  . Not on file    Family History  Problem Relation Age of Onset  . Dementia Mother   . Heart attack Father   . CAD Brother      Review of Systems:  See HPI for pertinent ROS. All other ROS negative.    Physical Exam: Blood pressure 99/60, pulse 86, temperature 97.6 F (36.4 C), temperature source Oral, resp. rate 20, height 5\' 6"  (1.676 m), weight 117.4 kg (258 lb 12.8 oz), SpO2 98 %., Body mass index is 41.77 kg/m. General:  WM. Appears in no acute distress. Neck: Supple. No thyromegaly. No lymphadenopathy. Lungs: Clear bilaterally to auscultation without  wheezes, rales, or rhonchi. Breathing is unlabored. Heart: RRR with S1 S2. No murmurs, rubs, or gallops. Abdomen: Soft, non-tender, non-distended with normoactive bowel sounds. No hepatomegaly. No rebound/guarding. Large umbilical hernia.  Musculoskeletal:  Strength and tone normal for age. Extremities/Skin: Warm and dry.  No LE edema.  Neuro: Alert and oriented X 3. Moves all extremities spontaneously. Gait is normal. CNII-XII grossly in tact. Psych:  Responds to questions appropriately with a normal affect.     ASSESSMENT AND PLAN:  62 y.o. year old male with    Essential hypertension 09/24/2017:  I am concerned that his cough may be secondary to ACE inhibitor.  Will discontinue lisinopril.  Replace with losartan 50 mg daily.  Have him return for follow-up visit in 2 weeks to recheck BP and bmet after medication change.  Also at that time we will follow-up to see if his cough has resolved.  10/08/2017: BP controlled.  Continue current meds.  Check lab to monitor since changing ACE inhibitor to ARB at last visit. - BASIC METABOLIC PANEL WITH GFR  02/19/6212:  I rechecked blood pressure today myself and I am getting 110/68. Nurse check today was 99/60.  He reports that at the surgeon's visit this week was 086 systolic. At this point I have told him to stop the losartan and the lisinopril both. Lisinopril  was most likely the cause of his cough so this needs to be stopped. At this point he is convinced that the losartan directly is causing his lightheadedness so we will keep him off of this medication.( even though lightheadedness secondary low BP in general/ secondary to combination of alcohol cessation, diet changes, weight loss, HCTZ) At this point we will have him just take the HCTZ for hypertension and we will have him return for follow-up in 1 week. They reviewed that his weight is down 28 pounds compared to his visit in December.  Discussed that with this weight loss makes sense that he  is not requiring as much blood pressure medication and that his blood pressure is improving. Follow up visit in 1 week.  As well he is to bring blood pressure log from home BP readings to that visit to review.   Umbilical hernia with obstruction, without gangrene AT visit 09/24/2017 I placed referral to general surgery. At  visit 10/08/2017 he reports that he has not yet heard about this appointment.  Suspect that it may just be taking some time to get this on the schedule will follow this up when he returns in 2 weeks to make sure this is scheduled/followed. 10/22/2017: He did have visit with a surgeon and they are planning to proceed with surgery.  He is to follow-up with the schedulers to get this scheduled.   Subclinical hypothyroidism 10/08/2017:  Lab 05/27/2017 showed elevated TSH but normal free T4.  We will recheck labs in 6 months.--Due to recheck 11/2017  Rosacea 09/24/2017:   At visit 06/11/2017 I was not aware of his significant alcohol use.  This was most likely the underlying cause of his rosacea.  Alcohol has now been discontinued.  At that visit I did prescribe MetroGel and doxycycline for rosacea. 10/08/2017:  Cont alcohol abstinence.  Continue the Flagyl and doxycycline if needed.  Alcohol abuse, in remission 09/24/2017:  He recently had a stay at [facility].  He currently is going to meetings on a daily basis and will be getting a sponsor soon.  His wife is helping hold him accountable to make sure he does not return to alcohol. 10/08/2017: He continues to abstain from alcohol.  He now has a sponsor.  Continues to go to meetings every night.  Severe obesity (BMI >= 40) ([place]) 06/11/2017: He Is being extremely compliant with diet and exercise changes. 09/24/2017: Today I reviewed that at his initial visit with me 05/27/17 weight was 286.  Today is 266.  He has lost 20 pounds !!! 10/22/2017--- weight today 258.  Down 28 pounds !!!  Elevated liver function tests 06/11/2017:  Hepatitis panel was negative.  He reports that he was taking Tylenol 1-2 times every single day and was drinking about 10 beers per day.  Is totally stop the Tylenol and has been weaning off of the ----------alcohol.  Is scheduled for right upper quadrant ultrasound tomorrow. ---------Suspect that the elevated LFTs were secondary to the alcohol use so we will recheck these in the future once he is off alcohol.    Possible OSA 05/27/2017--Will discuss at future OV. He has very large neck, HTN, Obesity---- high risk for OSA---will discuss at future OV 06/11/2017: He is being extremely compliant with lifestyle changes and has made lots of changes to his diet and is starting to exercise. 09/24/2017: See HPI for details, update regarding this.  Will hold off on sleep study at this time.    Plan follow-up visit with  me in 1 week. F/U sooner if needed.   Marin Olp Sanders, [place], [facility] 10/22/2017 9:28 AM

## 2017-10-27 NOTE — Patient Instructions (Addendum)
Samuel [name]  10/27/2017   Your procedure is scheduled [id number] 11/03/2017  Report to [facility] Main  Entrance    Report to Admitting at 5:30 AM    Call this number if you have problems the morning of surgery [phone]    Remember: Do not eat food or drink liquids :After Midnight.     Take these medicines the morning of surgery with A SIP OF WATER: None                                You may not have any metal on your body including hair pins and              piercings  Do not wear jewelry, lotions, powders or  deodorant             Men may shave face and neck.   Do not bring valuables to the hospital. [place].  Contacts, dentures or bridgework may not be worn into surgery.  .     Patients discharged the day of surgery will not be allowed to drive home.  Name and phone number of your driver:                Please read over the following fact sheets you were given: _____________________________________________________________________             [facility] - Preparing for Surgery Before surgery, you can play an important role.  Because skin is not sterile, your skin needs to be as free of germs as possible.  You can reduce the number of germs on your skin by washing with CHG (chlorahexidine gluconate) soap before surgery.  CHG is an antiseptic cleaner which kills germs and bonds with the skin to continue killing germs even after washing. Please DO NOT use if you have an allergy to CHG or antibacterial soaps.  If your skin becomes reddened/irritated stop using the CHG and inform your nurse when you arrive at Short Stay. Do not shave (including legs and underarms) for at least 48 hours prior to the first CHG shower.  You may shave your face/neck. Please follow these instructions carefully:  1.  Shower with CHG Soap the night before surgery and the  morning of Surgery.  2.  If you choose to wash  your hair, wash your hair first as usual with your  normal  shampoo.  3.  After you shampoo, rinse your hair and body thoroughly to remove the  shampoo.                           4.  Use CHG as you would any other liquid soap.  You can apply chg directly  to the skin and wash                       Gently with a scrungie or clean washcloth.  5.  Apply the CHG Soap to your body ONLY FROM THE NECK DOWN.   Do not use on face/ open  Wound or open sores. Avoid contact with eyes, ears mouth and genitals (private parts).                       Wash face,  Genitals (private parts) with your normal soap.             6.  Wash thoroughly, paying special attention to the area where your surgery  will be performed.  7.  Thoroughly rinse your body with warm water from the neck down.  8.  DO NOT shower/wash with your normal soap after using and rinsing off  the CHG Soap.                9.  Pat yourself dry with a clean towel.            10.  Wear clean pajamas.            11.  Place clean sheets on your bed the night of your first shower and do not  sleep with pets. Day of Surgery : Do not apply any lotions/deodorants the morning of surgery.  Please wear clean clothes to the hospital/surgery center.  FAILURE TO FOLLOW THESE INSTRUCTIONS MAY RESULT IN THE CANCELLATION OF YOUR SURGERY PATIENT SIGNATURE_________________________________  NURSE SIGNATURE__________________________________  ________________________________________________________________________

## 2017-10-27 NOTE — Progress Notes (Signed)
  07-20-17 (Epic) LOV w/Cardiologist Dr. Acie Fredrickson, f/u prn  06-10-17 (Epic) ECHO  06-02-17 (Epic) EKG

## 2017-10-28 ENCOUNTER — Encounter (HOSPITAL_COMMUNITY)
Admission: RE | Admit: 2017-10-28 | Discharge: 2017-10-28 | Disposition: A | Discharge status: Home / Self Care | Payer: Medicare Other | Source: Ambulatory Visit | Attending: Surgery | Admitting: Surgery

## 2017-10-28 ENCOUNTER — Encounter (HOSPITAL_COMMUNITY): Payer: Self-pay

## 2017-10-28 ENCOUNTER — Other Ambulatory Visit: Payer: Self-pay

## 2017-10-28 DIAGNOSIS — K429 Umbilical hernia without obstruction or gangrene: Secondary | ICD-10-CM | POA: Diagnosis not present

## 2017-10-28 DIAGNOSIS — Z01818 Encounter for other preprocedural examination: Secondary | ICD-10-CM | POA: Diagnosis not present

## 2017-10-28 HISTORY — DX: Unspecified osteoarthritis, unspecified site: M19.90

## 2017-10-28 LAB — CBC WITH DIFFERENTIAL/PLATELET
Basophils Absolute: 0 K/uL (ref 0.0–0.1)
Basophils Relative: 0 %
Eosinophils Absolute: 0.4 K/uL (ref 0.0–0.7)
Eosinophils Relative: 4 %
HCT: 42.1 % (ref 39.0–52.0)
Hemoglobin: 14.2 g/dL (ref 13.0–17.0)
Lymphocytes Relative: 25 %
Lymphs Abs: 2.4 K/uL (ref 0.7–4.0)
MCH: 31.5 pg (ref 26.0–34.0)
MCHC: 33.7 g/dL (ref 30.0–36.0)
MCV: 93.3 fL (ref 78.0–100.0)
Monocytes Absolute: 0.7 K/uL (ref 0.1–1.0)
Monocytes Relative: 8 %
Neutro Abs: 6.2 K/uL (ref 1.7–7.7)
Neutrophils Relative %: 63 %
Platelets: 227 K/uL (ref 150–400)
RBC: 4.51 MIL/uL (ref 4.22–5.81)
RDW: 13.1 % (ref 11.5–15.5)
WBC: 9.7 K/uL (ref 4.0–10.5)

## 2017-10-28 LAB — COMPREHENSIVE METABOLIC PANEL
ALT: 34 U/L (ref 17–63)
AST: 27 U/L (ref 15–41)
Albumin: 4.3 g/dL (ref 3.5–5.0)
Alkaline Phosphatase: 49 U/L (ref 38–126)
Anion gap: 9 (ref 5–15)
BUN: 20 mg/dL (ref 6–20)
CO2: 29 mmol/L (ref 22–32)
Calcium: 9.9 mg/dL (ref 8.9–10.3)
Chloride: 99 mmol/L — ABNORMAL LOW (ref 101–111)
Creatinine, Ser: 1.01 mg/dL (ref 0.61–1.24)
GFR calc Af Amer: >60 mL/min (ref >60)
GFR calc non Af Amer: >60 mL/min (ref >60)
Glucose, Bld: 122 mg/dL — ABNORMAL HIGH (ref 65–99)
Potassium: 4.6 mmol/L (ref 3.5–5.1)
Sodium: 137 mmol/L (ref 135–145)
Total Bilirubin: 0.7 mg/dL (ref 0.3–1.2)
Total Protein: 8 g/dL (ref 6.5–8.1)

## 2017-10-28 LAB — PROTIME-INR
INR: 1.04
Prothrombin Time: 13.5 s (ref 11.4–15.2)

## 2017-10-28 NOTE — Progress Notes (Signed)
   10/28/17 0842  OBSTRUCTIVE SLEEP APNEA  Have you ever been diagnosed with sleep apnea through a sleep study? No  Do you snore loudly (loud enough to be heard through closed doors)?  1  Do you often feel tired, fatigued, or sleepy during the daytime (such as falling asleep during driving or talking to someone)? 1  Has anyone observed you stop breathing during your sleep? 0  Do you have, or are you being treated for high blood pressure? 1  BMI more than 35 kg/m2? 1  Age > 50 (1-yes) 1  Neck circumference greater than:Male 16 inches or larger, Male 17inches or larger? 1  Male Gender (Yes=1) 1  Obstructive Sleep Apnea Score 7  Score 5 or greater  Results sent to PCP

## 2017-10-28 NOTE — Patient Instructions (Addendum)
Samuel [name]  10/28/2017   Your procedure is scheduled on: Tuesday 11/03/2017  Report to [facility] Main  Entrance              Report to admitting at  0530  AM    Call this number if you have problems the morning of surgery [phone]    Remember: Do not eat food or drink liquids :After Midnight.     Take these medicines the morning of surgery with A SIP OF WATER: none                                You may not have any metal on your body including hair pins and              piercings  Do not wear jewelry, make-up, lotions, powders or perfumes, deodorant                          Men may shave face and neck.   Do not bring valuables to the hospital. [place].  Contacts, dentures or bridgework may not be worn into surgery.  Leave suitcase in the car. After surgery it may be brought to your room.     Patients discharged the day of surgery will not be allowed to drive home.  Name and phone number of your driver:spouse- Robin                Please read over the following fact sheets you were given: _____________________________________________________________________             [facility] - Preparing for Surgery Before surgery, you can play an important role.  Because skin is not sterile, your skin needs to be as free of germs as possible.  You can reduce the number of germs on your skin by washing with CHG (chlorahexidine gluconate) soap before surgery.  CHG is an antiseptic cleaner which kills germs and bonds with the skin to continue killing germs even after washing. Please DO NOT use if you have an allergy to CHG or antibacterial soaps.  If your skin becomes reddened/irritated stop using the CHG and inform your nurse when you arrive at Short Stay. Do not shave (including legs and underarms) for at least 48 hours prior to the first CHG shower.  You may shave your face/neck. Please follow these  instructions carefully:  1.  Shower with CHG Soap the night before surgery and the  morning of Surgery.  2.  If you choose to wash your hair, wash your hair first as usual with your  normal  shampoo.  3.  After you shampoo, rinse your hair and body thoroughly to remove the  shampoo.                           4.  Use CHG as you would any other liquid soap.  You can apply chg directly  to the skin and wash                       Gently with a scrungie or clean washcloth.  5.  Apply the CHG Soap  to your body ONLY FROM THE NECK DOWN.   Do not use on face/ open                           Wound or open sores. Avoid contact with eyes, ears mouth and genitals (private parts).                       Wash face,  Genitals (private parts) with your normal soap.             6.  Wash thoroughly, paying special attention to the area where your surgery  will be performed.  7.  Thoroughly rinse your body with warm water from the neck down.  8.  DO NOT shower/wash with your normal soap after using and rinsing off  the CHG Soap.                9.  Pat yourself dry with a clean towel.            10.  Wear clean pajamas.            11.  Place clean sheets on your bed the night of your first shower and do not  sleep with pets. Day of Surgery : Do not apply any lotions/deodorants the morning of surgery.  Please wear clean clothes to the hospital/surgery center.  FAILURE TO FOLLOW THESE INSTRUCTIONS MAY RESULT IN THE CANCELLATION OF YOUR SURGERY PATIENT SIGNATURE_________________________________  NURSE SIGNATURE__________________________________  ________________________________________________________________________

## 2017-10-29 ENCOUNTER — Inpatient Hospital Stay (HOSPITAL_COMMUNITY)
Admission: RE | Admit: 2017-10-29 | Discharge: 2017-10-29 | Disposition: A | Discharge status: Home / Self Care | Payer: BLUE CROSS/BLUE SHIELD | Source: Ambulatory Visit

## 2017-11-02 ENCOUNTER — Encounter: Payer: Self-pay | Admitting: Physician Assistant

## 2017-11-02 ENCOUNTER — Ambulatory Visit (INDEPENDENT_AMBULATORY_CARE_PROVIDER_SITE_OTHER): Payer: Medicare Other | Admitting: Physician Assistant

## 2017-11-02 VITALS — BP 148/92 | HR 59 | Temp 97.7°F | Resp 16 | Ht 66.0 in | Wt 258.4 lb

## 2017-11-02 DIAGNOSIS — R053 Chronic cough: Secondary | ICD-10-CM

## 2017-11-02 DIAGNOSIS — R05 Cough: Secondary | ICD-10-CM | POA: Diagnosis not present

## 2017-11-02 DIAGNOSIS — I10 Essential (primary) hypertension: Secondary | ICD-10-CM

## 2017-11-02 MED ORDER — CETIRIZINE HCL 10 MG PO CAPS: ORAL_CAPSULE | ORAL | 5 refills | Status: DC

## 2017-11-02 MED ORDER — AMLODIPINE BESYLATE 5 MG PO TABS: 5.0000 mg | ORAL_TABLET | Freq: Every day | ORAL | 11 refills | Status: DC

## 2017-11-02 MED ORDER — OMEPRAZOLE 20 MG PO CPDR: 20.0000 mg | DELAYED_RELEASE_CAPSULE | Freq: Every day | ORAL | 3 refills | Status: DC

## 2017-11-02 NOTE — Progress Notes (Signed)
Patient ID: Samuel [name] MRN: [id number], DOB: [birth year], 62 y.o. Date of Encounter: @DATE @  Chief Complaint:  Chief Complaint  Patient presents with  . Cough  . discuss blood pressure    HPI: 62 y.o. year old male  presents with above.    05/27/2017: He presents as a new patient to establish care.  He is scheduled for right total knee replacement on Monday 06/01/2017 with Dr. Alvan Dame. However, he states that he already realizes that this is probably going to have to be postponed/rescheduled and I have told him today that it definitely will need to be.  I informed him to call [facility] and inform them of today's visit and plans and need to postpone surgery. In regards to the right total knee replacement he also says "I get myself all worked up-----I have heard so many stories of people having knee replacement surgery and that the first 20 days are hell"--- "and that he is concerned because he cannot take any type of opioids because they cause him itching so is concerned that he cannot take pain medicines and the amount of pain that he will have."  He reports that he had a cardiac catheterization about 10 years ago at [facility].  Says that he was told that there was some blockage but not significant enough to require stents.  However says "but that was 10 years ago so I do not know if things have changed since then ".  Says that recently when he walks to the mailbox and back, he is significantly short of breath.  States that this is new.  Names off activities that he used to be able to do-- with no shortness of breath / DOE.  Also discussed that his blood pressure is reading significantly high today.  Reports that in the past when he would be at work if he had a headache he would get the CNA at work to check his blood pressure and it would be high.  Says "then later the headache would be gone" /resolved and that he "would get his blood pressure rechecked and it would be okay." He  has no other known history of high blood pressure but also admits that he has never had a PCP until now.  Reports that he has been experiencing nausea and diarrhea for the past 3 days.  Has had no vomiting.  Says that about 3 days ago the vomiting was significant frequency.  Says that yesterday he did not eat and his stomach would feel upset but then nothing would really come out because he had not eaten.  States that he ate again last night and then did have diarrhea again this morning. Later, when it came time to get labs and I was wondering whether he was fasting, if he had had anything to eat today-- he says "A couple of pieces of cheese."  Reports that he quit smoking 20 years ago.  Reports that he has no other known medical history.   AT THAT OV:  Started Lisinopril 10 mg daily for blood pressure. Checked screening labs.  Was not fasting so did not check lipid panel. CBC was normal.  HIV was negative.  PSA was normal.  AST and ALT were elevated.  Hepatitis panel was added and was negative.  Right upper quadrant ultrasound was ordered.  Patient states that this is scheduled for tomorrow. Also placed referral to Cardiology.  Had visit with Dr. Acie Fredrickson 06/02/17.  I reviewed that office  note.  Blood Pressure was still elevated at that visit.  Added HCTZ 25 mg daily and K-Dur 10 milliequivalent daily in addition to the lisinopril. Today patient reports that he also discussed diet and exercise a lot with Dr. Acie Fredrickson.  Pt states that he has cut out carbohydrates and is starting silver sneakers on January 1.  We also discussed the elevated LFTs. Patient reports that he was taking Tylenol every day because of his knee pain.  Sometimes was taking twice daily. He also reports that he was "drinking like a fish ".  Says that he was drinking about 10 beers every single day.  Says "that was his past time." Says that he has seen people who have stopped drinking cold [place] and knows that that will cause  DTs.  Therefore did not stop cold [place].  Has gradually weaned off of the alcohol.  Is down to drinking just 1 or 2 beers in the evening.  Plans to completely stop this but did not want to cause DTs.    06/11/2017: Reports that he is taking the lisinopril 10 mg daily.  Also is taking the HCTZ 25 mg daily and K-Dur 10 milliequivalent daily. He states that he had the stress test yesterday.  Is scheduled for the right upper quadrant ultrasound tomorrow.  Scheduled to see Dr. Acie Fredrickson back for follow-up visit February 4.  States that he is supposed to go have fasting labs done that day. Reports that he has already lost about 10 pounds and has decreased 1-2  inches in his waist. Discussed that my concern of possible sleep apnea.  He states that he knows that he does snore loudly but has not been told of any periods of sounding like he stops breathing.  Says that he sleeps for about 2 hours and then gets up to take the dogs out and then sleeps another 2 hours and then gets up again. So discussed that since he is having so many lifestyle changes and is having weight reduction will wait to discuss testing for sleep apnea. Says that Dr. Acie Fredrickson also discussed that he may not even have to have knee surgery if he loses weight and this has really been a motivating for him. He reports that he has been noticing these bumps and red areas on his cheeks of his skin on his face.  No other specific concerns to address today.    09/17/2017: Says symptoms started 10 days ago.  Says he has been at [facility], that is why he is just now coming in to be seen.  Just got out of [facility] 3 hours ago.  His wife accompanies him for OV today.  He says he tried to quit alcohol at home but got DTs so realized he had to go somewhere that could help him.   Reports that his throat has felt sore for 10 days. Says he has been "getting out the nastiest looking phlegm you've ever seen". Has chest congestion. No known  fevers, chills.   At that OV--prescribed meds for his respiratory infection, recommended that he schedule a follow-up visit for [place] to update regarding his general medical care. He is scheduling f/u OV for 30 minute slot in 1 -2 weeks--for routine f/u --- he has a lot going on, recent cardiac cath, etc.    09/24/2017: He presents for that follow-up visit today. Talks about having pain in multiple areas including his knees his hands his back etc.  Mentions rheumatoid arthritis and asks  if he should see a rheumatologist.  However also in that same breath, he references things that he knows he has put his body through and "being banged up".  Explained that rheumatoid arthritis is a different condition and that what he is referring to is osteoarthritis.  Discussed that for osteoarthritis there would not be a lot of additional treatment from a rheumatologist.  Then starts discussing his knees and says that he has appointment to see orthopedics on the 17th and says that he wants to do the injection series again if the orthopedic agrees with that.  Says that he is losing weight and is hoping that will help his knees.  He states that in regards to the alcohol that this Saturday will be day 30 of him being alcohol free.  Says that he and his wife are going to meetings every single day.  Says that his wife does not drink alcohol but that she is going with him everywhere he goes.  Says "he does not leave the house without her -- she is my body guard -- she makes sure I do not stop at a convenient store etc. ".  Says that for now he is going to the meetings every day and that he will be getting a sponsor soon.  Lifts his shirt and shows me large umbilical hernia and asks what he can do about that.  Says that he is concerned that it is going to cause problems one day.  He reports that he occasionally still has a cough.  Says that he will go all day without a cough and then all of a sudden will feel a tickle in his  throat and then starts coughing and cannot stop.   Says that since taking the antibiotic I gave him, that the phlegm color has changed but still has occasional episode of cough.  Today I reviewed Dr. Elmarie Shiley office note from 07/20/17.  Reviewed that patient had a Myoview study that was abnormal.  Cardiac catheterization on December 24 revealed mild to moderate nonobstructive CAD.  Distal branches are small and diffusely diseased.  Normal LV systolic function.  Today patient states "he pretty much told me I could just go back there on an as-needed basis ".  Today I reviewed that he had a lipid panel 06/12/17 that showed triglycerides 73, HDL 49, LDL 112. PSA was checked 05/2017 and was normal. Today I discussed that the only other cancer screening that is recommended is screening for colorectal cancer.  He does not want to proceed with a colonoscopy or any screening right now.  Says that prior to coming to see me recently he had not had any medical care in years and now all of a sudden has so much going on-- wants to put this on hold.  Today I also reviewed that at his initial visit with me I was concerned of possible sleep apnea.  He reports that his wife has told him that he snores but that when he was at [facility] they told him he did not snore there.  Also he is losing weight right now with diet and exercise.  Also, he reports that he does feel rested when he wakes up in the morning.  He wants to hold off on sleep study at this point.  No other concerns to address today.  Addendum Added 10/05/2017: Received note from [facility]. Date of encounter: 09/30/2017 Audiological Testing was performed.  They recommended certain hearing aids or reprogramming his current aids.  He was going to check into insurance coverage then f/u with them.   AT THAT OV ON 4/00/8676: Umbilical hernia----placed referral to general surgery Hypertension------- concerned that cough may be secondary to ACE inhibitor.     ---------------------------Discontinued lisinopril and replaced with losartan 50 mg daily.   ---------------------------Planned for him return in 2 weeks to recheck BP and BMET after med change.   ---------------------------Also with follow-up to see if cough resolved after med change.    10/08/2017: Today he reports that he did discontinue the lisinopril and did start the losartan 50 mg daily.   However, he reports that he continues to have the cough.  Says that he "seems to get the coughing spells at the most inconvenient times-- at times that he supposed to be quiet-- like when he is in church or sitting in [place] meeting."  He does have phlegm present.  Says that if he does blow his nose, that there is thick mucus there-- but mostly is not having much come out of his nose-- mostly is feeling drainage in his throat and chest. He has not heard yet regarding appointment with surgeon. He is glad that he has lost a few more pounds today even since his most recent visit. Says that he has stayed off of the alcohol.  Says that "his wife has backed off some".  Says that "this is a problem with me, not her", Says that he now has a sponsor and is continuing to go to the meetings every night.  Says "I am seeing too many positives to go back to ever drinking ".   "I realize I was living in such a haze ".  Also is feeling better and losing weight and knows that it is going to help improve his health in addition to improving other areas of his life.  He has no other specific concerns to address. AT THAT OV: --Prescribed Levaquin. And Hycodan. --Continued current BP meds. Checked lab to monitor since changed ACE Inh to ARB at [facility]   10/22/2017:  Today I reviewed phone notes that are documented since last visit. On 10/15/2017 patient called in stating that the losartan 50 mg was causing him to have dizziness.   Reported that the Levaquin and Hycodan had helped with his cough.  Was wondering if he could go back to  the lisinopril. I replied that --yes-- he could discontinue the losartan and return to the lisinopril.  Today he reports: Today he reports that when he was on the losartan he had some episodes of feeling lightheaded like he was going to pass out.  Says "it could have had to do with what I was doing though ".  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  Had to sit down and drink some water.  However, he states "that pill made me feel bad so I stopped it and went back to the lisinopril. I would rather have a cough." Today we reviewed all the other things that have been changes that have been occurring in his body in addition to the changes with the blood pressure medicines.   That he has lost almost 30 pounds in just 5 months.   He brings up his weight loss during the visit and is excited that he has had to buy new clothes because the  other ones are too big.   Asked what he had changed other than stopping the alcohol.   His response is "the all-you-can-eat [place] buffet ".   Says "now he is eating low to no carbs".   "Now he is eating small portions instead of eating until I feel full ".   Is pretty much cut out potato pasta rice.   Says but I do drink [place] coffee in the morning-- he says that there are 4 teaspoons of sugar and espresso.  Says that he is getting all that sugar at that point right in the morning but otherwise very little carbohydrates or sugar.  He reports that he did have visit with the surgeon and I noted that I did get note from them and they are going to proceed with repair to his umbilical hernia.  He states that the surgery scheduler has been calling him but they "are playing phone tag" and he will make sure to talk to them today.  Reports that even though he has had these couple episodes of lightheadedness when  working in the heat that he has continued to get good blood pressure readings at home.  Says that even this morning got reading of 137/70.  However, notes that when he did go to the surgeon's office they did mention getting 062 systolic reading.  Reports that since he took the Levaquin all of his phlegm has resolved and cough has resolved.  Also states that regarding his left knee-- he is going to start the viscous supplement injections-- for a set of 3-- and will start at the end of this month ".   Says "after he does the series of 3 to the left knee then going to do the right one ".  He is staying off of the alcohol.  Is continuing to go to the meetings etc.  No other concerns to address today.    11/02/2017:  A/P AT LOV 10/22/2017:  I rechecked blood pressure today myself and I am getting 110/68. Nurse check today was 99/60.  He reports that at the surgeon's visit this week was 694 systolic. At this point I have told him to stop the losartan and the lisinopril both. Lisinopril was most likely the cause of his cough so this needs to be stopped. At this point he is convinced that the losartan directly is causing his lightheadedness so we will keep him off of this medication.( even though lightheadedness secondary low BP in general/ secondary to combination of alcohol cessation, diet changes, weight loss, HCTZ) At this point we will have him just take the HCTZ for hypertension and we will have him return for follow-up in 1 week. They reviewed that his weight is down 28 pounds compared to his visit in December.  Discussed that with this weight loss makes sense that he is not requiring as much blood pressure medication and that his blood pressure is improving. Follow up visit in 1 week.  As well he is to bring blood pressure log from home BP readings to that visit to review.   Today pt reports that he did stop both the losartan and lisinopril as directed. He reports that he has been checking  blood pressure at home since then.  Systolic read 854 once, but at other times,  all other systolic readings in the 627O to 140s.  Systolic never less than 350.  Diastolic ranged from 09-38.  Today I reviewed the fact that we had discussed possible sleep apnea  and need for sleep study in the past.   Also reviewed that his recent preop evaluation screen showed concern for possible sleep apnea.   Again today he states that he wants to put that on hold.   States that he has too many other things going on regarding medical care right now.   Tomorrow has the surgery for his umbilical hernia repair.   Wednesday has injection to the left knee.   Also says that he "sleeps like a baby" as far as he can tell.  Today he reports that "the cough is back."   States that it is a dry, hacky cough.   States that once he starts coughing, he cannot stop.   States that he does not feel any drainage or phlegm in his throat and is not coughing up any kind of phlegm or drainage.  Is feeling no sore throat.   Says that it feels like something deep down in his throat at times and that is what starts the coughing to start. He is having no symptoms of classic heartburn.  Is not tasting acid like vomit in his mouth/throat.     Past Medical History:  Diagnosis Date  . Arthritis   . HTN (hypertension)      Home Meds: Outpatient Medications Prior to Visit  Medication Sig Dispense Refill  . aspirin EC 81 MG tablet Take 1 tablet (81 mg total) by mouth daily.    Marland Kitchen ibuprofen (ADVIL,MOTRIN) 200 MG tablet Take 400 mg by mouth 4 (four) times daily as needed for moderate pain.     . metroNIDAZOLE (METROGEL) 1 % gel Apply topically daily. (Patient taking differently: Apply 1 application topically daily as needed. ) 45 g 0  . hydrochlorothiazide (HYDRODIURIL) 25 MG tablet Take 1 tablet (25 mg total) by mouth daily. 90 tablet 3   No facility-administered medications prior to visit.     Allergies:  Allergies  Allergen  Reactions  . Other     Opioids - "scratch my face off"     Social History   Socioeconomic History  . Marital status: Married    Spouse name: Not on file  . Number of children: Not on file  . Years of education: Not on file  . Highest education level: Not on file  Occupational History  . Not on file  Social Needs  . Financial resource strain: Not on file  . Food insecurity:    Worry: Not on file    Inability: Not on file  . Transportation needs:    Medical: Not on file    Non-medical: Not on file  Tobacco Use  . Smoking status: Never Smoker  . Smokeless tobacco: Never Used  Substance and Sexual Activity  . Alcohol use: No    Comment: 63 days sober from today 10/28/2017  . Drug use: No  . Sexual activity: Not on file  Lifestyle  . Physical activity:    Days per week: Not on file    Minutes per session: Not on file  . Stress: Not on file  Relationships  . Social connections:    Talks on phone: Not on file    Gets together: Not on file    Attends religious service: Not on file    Active member of club or organization: Not on file    Attends meetings of clubs or organizations: Not on file    Relationship status: Not on file  . Intimate partner violence:    Fear of  current or ex partner: Not on file    Emotionally abused: Not on file    Physically abused: Not on file    Forced sexual activity: Not on file  Other Topics Concern  . Not on file  Social History Narrative  . Not on file    Family History  Problem Relation Age of Onset  . Dementia Mother   . Heart attack Father   . CAD Brother      Review of Systems:  See HPI for pertinent ROS. All other ROS negative.    Physical Exam: Blood pressure (!) 148/92, pulse (!) 59, temperature 97.7 F (36.5 C), temperature source Oral, resp. rate 16, height 5\' 6"  (1.676 m), weight 117.2 kg (258 lb 6.4 oz), SpO2 99 %., Body mass index is 41.71 kg/m. General:  WNWD WM. Appears in no acute distress. Head:  Normocephalic, atraumatic, eyes without discharge, sclera non-icteric, nares are without discharge. Bilateral auditory canals clear, TM's are without perforation, pearly grey and translucent with reflective cone of light bilaterally. Oral cavity moist, posterior pharynx without exudate, erythema, peritonsillar abscess, or post nasal drip.  Neck: Supple. No thyromegaly. No lymphadenopathy. Lungs: Clear bilaterally to auscultation without wheezes, rales, or rhonchi. Breathing is unlabored. Heart: RRR with S1 S2. No murmurs, rubs, or gallops. Abdomen: Soft, non-tender, non-distended with normoactive bowel sounds. No hepatomegaly. No rebound/guarding. Umbilical hernia present. Musculoskeletal:  Strength and tone normal for age. Extremities/Skin: Warm and dry. No edema.  Neuro: Alert and oriented X 3. Moves all extremities spontaneously. Gait is normal. CNII-XII grossly in tact. Psych:  Responds to questions appropriately with a normal affect.     ASSESSMENT AND PLAN:  62 y.o. year old male with    1. Essential hypertension At this time we will keep him off of ACE inhibitor giving issues with cough. At this time we will keep him off ARB given that he was convinced that it was the losartan that was causing some of his symptoms in the past At this time will continue the HCTZ At this time we will add Norvasc 5 mg daily to take with the HCTZ. Have him return for follow-up visit in 2 weeks to recheck BP on these medications. - amLODipine (NORVASC) 5 MG tablet; Take 1 tablet (5 mg total) by mouth daily.  Dispense: 30 tablet; Refill: 11  2. Chronic cough He has been treated with Levaquin and cough has reoccurred. He is now off ACE inhibitor and cough is still reoccurring. Will further evaluate cause of cough--- obtain chest x-ray.  Add Zyrtec daily to dry up any drainage as a trial of treatment for possible postnasal drip.  Add PPI to treat possible underlying GERD. If chest x-ray is negative and  symptoms persist after taking Zyrtec and PPI--- then will consider referral to ENT for possible laryngoscopy. He will return for follow-up visit in 2 weeks and will follow-up this issue at that visit. - DG Chest 2 View; Future - Cetirizine HCl (ZYRTEC ALLERGY) 10 MG CAPS; Take one daily.  Dispense: 30 capsule; Refill: 5 - omeprazole (PRILOSEC) 20 MG capsule; Take 1 capsule (20 mg total) by mouth daily.  Dispense: 30 capsule; Refill: 3     ---------------THE FOLLOWING IS COPIED FROM PRIOR OV NOTE--THE FOLLOWING ARE NOT ADDRESSED AT OV 11/02/2017:--------------------------------  Umbilical hernia with obstruction, without gangrene AT visit 09/24/2017 I placed referral to general surgery. At  visit 10/08/2017 he reports that he has not yet heard about this appointment.  Suspect that it may  just be taking some time to get this on the schedule will follow this up when he returns in 2 weeks to make sure this is scheduled/followed. 10/22/2017: He did have visit with a surgeon and they are planning to proceed with surgery.  He is to follow-up with the schedulers to get this scheduled.   Subclinical hypothyroidism 10/08/2017:  Lab 05/27/2017 showed elevated TSH but normal free T4.  We will recheck labs in 6 months.--Due to recheck 11/2017  Rosacea 09/24/2017:   At visit 06/11/2017 I was not aware of his significant alcohol use.  This was most likely the underlying cause of his rosacea.  Alcohol has now been discontinued.  At that visit I did prescribe MetroGel and doxycycline for rosacea. 10/08/2017:  Cont alcohol abstinence.  Continue the Flagyl and doxycycline if needed.  Alcohol abuse, in remission 09/24/2017:  He recently had a stay at [facility].  He currently is going to meetings on a daily basis and will be getting a sponsor soon.  His wife is helping hold him accountable to make sure he does not return to alcohol. 10/08/2017: He continues to abstain from alcohol.  He now has a sponsor.  Continues  to go to meetings every night.  Severe obesity (BMI >= 40) ([place]) 06/11/2017: He Is being extremely compliant with diet and exercise changes. 09/24/2017: Today I reviewed that at his initial visit with me 05/27/17 weight was 286.  Today is 266.  He has lost 20 pounds !!! 10/22/2017--- weight today 258.  Down 28 pounds !!!  Elevated liver function tests 06/11/2017: Hepatitis panel was negative.  He reports that he was taking Tylenol 1-2 times every single day and was drinking about 10 beers per day.  Is totally stop the Tylenol and has been weaning off of the ----------alcohol.  Is scheduled for right upper quadrant ultrasound tomorrow. ---------Suspect that the elevated LFTs were secondary to the alcohol use so we will recheck these in the future once he is off alcohol.    Possible OSA 05/27/2017--Will discuss at future OV. He has very large neck, HTN, Obesity---- high risk for OSA---will discuss at future OV 06/11/2017: He is being extremely compliant with lifestyle changes and has made lots of changes to his diet and is starting to exercise. 09/24/2017: See HPI for details, update regarding this.  Will hold off on sleep study at this time. 11/02/2017---See HPI for details. Discussed that PreOp Eval also showed + screen, concern for possible osa. See HPI for details.   Plan follow-up visit with me in 1 - 2weeks. F/U sooner if needed.   Signed, [address] [place], [place], [facility] 11/02/2017 9:27 AM

## 2017-11-03 ENCOUNTER — Encounter (HOSPITAL_COMMUNITY): Payer: Self-pay | Admitting: *Deleted

## 2017-11-03 ENCOUNTER — Ambulatory Visit (HOSPITAL_COMMUNITY): Payer: Medicare Other | Admitting: Certified Registered Nurse Anesthetist

## 2017-11-03 ENCOUNTER — Ambulatory Visit
Admission: RE | Admit: 2017-11-03 | Discharge: 2017-11-03 | Disposition: A | Discharge status: Home / Self Care | Payer: Medicare Other | Source: Ambulatory Visit | Attending: Physician Assistant | Admitting: Physician Assistant

## 2017-11-03 ENCOUNTER — Ambulatory Visit (HOSPITAL_COMMUNITY)
Admission: RE | Admit: 2017-11-03 | Discharge: 2017-11-03 | Disposition: A | Discharge status: Home / Self Care | Payer: Medicare Other | Source: Ambulatory Visit | Attending: Surgery | Admitting: Surgery

## 2017-11-03 ENCOUNTER — Other Ambulatory Visit: Payer: Self-pay

## 2017-11-03 ENCOUNTER — Encounter (HOSPITAL_COMMUNITY)
Admission: RE | Disposition: A | Discharge status: Home / Self Care | Payer: Self-pay | Source: Ambulatory Visit | Attending: Surgery

## 2017-11-03 DIAGNOSIS — Z79899 Other long term (current) drug therapy: Secondary | ICD-10-CM | POA: Diagnosis not present

## 2017-11-03 DIAGNOSIS — R053 Chronic cough: Secondary | ICD-10-CM

## 2017-11-03 DIAGNOSIS — Z7982 Long term (current) use of aspirin: Secondary | ICD-10-CM | POA: Diagnosis not present

## 2017-11-03 DIAGNOSIS — R05 Cough: Secondary | ICD-10-CM | POA: Diagnosis not present

## 2017-11-03 DIAGNOSIS — F419 Anxiety disorder, unspecified: Secondary | ICD-10-CM | POA: Insufficient documentation

## 2017-11-03 DIAGNOSIS — E038 Other specified hypothyroidism: Secondary | ICD-10-CM | POA: Diagnosis not present

## 2017-11-03 DIAGNOSIS — I10 Essential (primary) hypertension: Secondary | ICD-10-CM | POA: Diagnosis not present

## 2017-11-03 DIAGNOSIS — Z87891 Personal history of nicotine dependence: Secondary | ICD-10-CM | POA: Diagnosis not present

## 2017-11-03 DIAGNOSIS — M1990 Unspecified osteoarthritis, unspecified site: Secondary | ICD-10-CM | POA: Insufficient documentation

## 2017-11-03 DIAGNOSIS — K420 Umbilical hernia with obstruction, without gangrene: Secondary | ICD-10-CM | POA: Insufficient documentation

## 2017-11-03 DIAGNOSIS — R069 Unspecified abnormalities of breathing: Secondary | ICD-10-CM | POA: Diagnosis not present

## 2017-11-03 HISTORY — PX: UMBILICAL HERNIA REPAIR: SHX196

## 2017-11-03 SURGERY — REPAIR, HERNIA, UMBILICAL, ADULT
Anesthesia: General | Site: Abdomen

## 2017-11-03 MED ORDER — SODIUM CHLORIDE 0.9% FLUSH: 3.0000 mL | INTRAVENOUS | Status: DC | PRN

## 2017-11-03 MED ORDER — DEXAMETHASONE SODIUM PHOSPHATE 10 MG/ML IJ SOLN
INTRAMUSCULAR | Status: DC | PRN
  Administered 2017-11-03: 10 mg via INTRAVENOUS

## 2017-11-03 MED ORDER — SUGAMMADEX SODIUM 500 MG/5ML IV SOLN
INTRAVENOUS | Status: AC
  Filled 2017-11-03: qty 5

## 2017-11-03 MED ORDER — ROCURONIUM BROMIDE 10 MG/ML (PF) SYRINGE
PREFILLED_SYRINGE | INTRAVENOUS | Status: AC
  Filled 2017-11-03: qty 5

## 2017-11-03 MED ORDER — ONDANSETRON HCL 4 MG/2ML IJ SOLN
INTRAMUSCULAR | Status: DC | PRN
  Administered 2017-11-03: 4 mg via INTRAVENOUS

## 2017-11-03 MED ORDER — FENTANYL CITRATE (PF) 250 MCG/5ML IJ SOLN
INTRAMUSCULAR | Status: AC
  Filled 2017-11-03: qty 5

## 2017-11-03 MED ORDER — CEFAZOLIN SODIUM-DEXTROSE 2-4 GM/100ML-% IV SOLN
2.0000 g | INTRAVENOUS | Status: AC
  Administered 2017-11-03: 2 g via INTRAVENOUS
  Filled 2017-11-03: qty 100

## 2017-11-03 MED ORDER — LACTATED RINGERS IV SOLN
INTRAVENOUS | Status: DC
  Administered 2017-11-03 (×2): via INTRAVENOUS

## 2017-11-03 MED ORDER — ONDANSETRON HCL 4 MG/2ML IJ SOLN
INTRAMUSCULAR | Status: AC
  Filled 2017-11-03: qty 2

## 2017-11-03 MED ORDER — BUPIVACAINE-EPINEPHRINE 0.5% -1:200000 IJ SOLN
INTRAMUSCULAR | Status: DC | PRN
  Administered 2017-11-03: 30 mL

## 2017-11-03 MED ORDER — MIDAZOLAM HCL 2 MG/2ML IJ SOLN
INTRAMUSCULAR | Status: AC
  Filled 2017-11-03: qty 2

## 2017-11-03 MED ORDER — ACETAMINOPHEN 650 MG RE SUPP
650.0000 mg | RECTAL | Status: DC | PRN
  Filled 2017-11-03: qty 1

## 2017-11-03 MED ORDER — CHLORHEXIDINE GLUCONATE 4 % EX LIQD
60.0000 mL | Freq: Once | CUTANEOUS | Status: DC
Start: 2017-11-04 — End: 2017-11-03

## 2017-11-03 MED ORDER — SODIUM CHLORIDE 0.9 % IV SOLN: 250.0000 mL | INTRAVENOUS | Status: DC | PRN

## 2017-11-03 MED ORDER — OXYCODONE HCL 5 MG PO TABS
5.0000 mg | ORAL_TABLET | ORAL | Status: DC | PRN
  Administered 2017-11-03: 5 mg via ORAL

## 2017-11-03 MED ORDER — ONDANSETRON HCL 4 MG/2ML IJ SOLN: 4.0000 mg | Freq: Once | INTRAMUSCULAR | Status: DC | PRN

## 2017-11-03 MED ORDER — HYDROCODONE-ACETAMINOPHEN 5-325 MG PO TABS: 1.0000 | ORAL_TABLET | Freq: Four times a day (QID) | ORAL | 0 refills | Status: DC | PRN

## 2017-11-03 MED ORDER — MEPERIDINE HCL 50 MG/ML IJ SOLN: 6.2500 mg | INTRAMUSCULAR | Status: DC | PRN

## 2017-11-03 MED ORDER — PROPOFOL 10 MG/ML IV BOLUS
INTRAVENOUS | Status: AC
  Filled 2017-11-03: qty 20

## 2017-11-03 MED ORDER — FENTANYL CITRATE (PF) 100 MCG/2ML IJ SOLN
INTRAMUSCULAR | Status: DC | PRN
  Administered 2017-11-03: 100 ug via INTRAVENOUS
  Administered 2017-11-03 (×2): 50 ug via INTRAVENOUS

## 2017-11-03 MED ORDER — ROCURONIUM BROMIDE 50 MG/5ML IV SOSY
PREFILLED_SYRINGE | INTRAVENOUS | Status: DC | PRN
  Administered 2017-11-03: 50 mg via INTRAVENOUS

## 2017-11-03 MED ORDER — SODIUM CHLORIDE 0.9 % IR SOLN
Status: DC | PRN
  Administered 2017-11-03: 1000 mL

## 2017-11-03 MED ORDER — GABAPENTIN 300 MG PO CAPS
300.0000 mg | ORAL_CAPSULE | ORAL | Status: AC
  Administered 2017-11-03: 300 mg via ORAL
  Filled 2017-11-03: qty 1

## 2017-11-03 MED ORDER — BUPIVACAINE-EPINEPHRINE (PF) 0.5% -1:200000 IJ SOLN
INTRAMUSCULAR | Status: AC
  Filled 2017-11-03: qty 30

## 2017-11-03 MED ORDER — OXYCODONE HCL 5 MG PO TABS
ORAL_TABLET | ORAL | Status: AC
  Filled 2017-11-03: qty 1

## 2017-11-03 MED ORDER — DEXAMETHASONE SODIUM PHOSPHATE 10 MG/ML IJ SOLN
INTRAMUSCULAR | Status: AC
  Filled 2017-11-03: qty 1

## 2017-11-03 MED ORDER — ACETAMINOPHEN 500 MG PO TABS
1000.0000 mg | ORAL_TABLET | ORAL | Status: AC
  Administered 2017-11-03: 1000 mg via ORAL
  Filled 2017-11-03: qty 2

## 2017-11-03 MED ORDER — ACETAMINOPHEN 325 MG PO TABS: 650.0000 mg | ORAL_TABLET | ORAL | Status: DC | PRN

## 2017-11-03 MED ORDER — LIDOCAINE 2% (20 MG/ML) 5 ML SYRINGE
INTRAMUSCULAR | Status: AC
  Filled 2017-11-03: qty 5

## 2017-11-03 MED ORDER — FENTANYL CITRATE (PF) 100 MCG/2ML IJ SOLN: 25.0000 ug | INTRAMUSCULAR | Status: DC | PRN

## 2017-11-03 MED ORDER — HYDROMORPHONE HCL 1 MG/ML IJ SOLN: 0.2500 mg | INTRAMUSCULAR | Status: DC | PRN

## 2017-11-03 MED ORDER — PROPOFOL 10 MG/ML IV BOLUS
INTRAVENOUS | Status: DC | PRN
  Administered 2017-11-03: 120 mg via INTRAVENOUS

## 2017-11-03 MED ORDER — DOCUSATE SODIUM 100 MG PO CAPS: 100.0000 mg | ORAL_CAPSULE | Freq: Two times a day (BID) | ORAL | 0 refills | Status: AC

## 2017-11-03 MED ORDER — CELECOXIB 200 MG PO CAPS
200.0000 mg | ORAL_CAPSULE | ORAL | Status: AC
  Administered 2017-11-03: 200 mg via ORAL
  Filled 2017-11-03: qty 1

## 2017-11-03 MED ORDER — LIDOCAINE 2% (20 MG/ML) 5 ML SYRINGE
INTRAMUSCULAR | Status: DC | PRN
  Administered 2017-11-03: 100 mg via INTRAVENOUS

## 2017-11-03 MED ORDER — MIDAZOLAM HCL 5 MG/5ML IJ SOLN
INTRAMUSCULAR | Status: DC | PRN
  Administered 2017-11-03: 2 mg via INTRAVENOUS

## 2017-11-03 MED ORDER — CHLORHEXIDINE GLUCONATE 4 % EX LIQD: 60.0000 mL | Freq: Once | CUTANEOUS | Status: DC

## 2017-11-03 MED ORDER — SODIUM CHLORIDE 0.9% FLUSH: 3.0000 mL | Freq: Two times a day (BID) | INTRAVENOUS | Status: DC

## 2017-11-03 MED ORDER — SUGAMMADEX SODIUM 500 MG/5ML IV SOLN
INTRAVENOUS | Status: DC | PRN
  Administered 2017-11-03: 250 mg via INTRAVENOUS

## 2017-11-03 SURGICAL SUPPLY — 26 items
BENZOIN TINCTURE PRP APPL 2/3 (GAUZE/BANDAGES/DRESSINGS) ×6 IMPLANT
CHLORAPREP W/TINT 26ML (MISCELLANEOUS) ×3 IMPLANT
COTTON BALL STERILE (GAUZE/BANDAGES/DRESSINGS) ×6
COTTON BALL STERILE 2 PK (GAUZE/BANDAGES/DRESSINGS) ×4 IMPLANT
COVER SURGICAL LIGHT HANDLE (MISCELLANEOUS) ×3 IMPLANT
DECANTER SPIKE VIAL GLASS SM (MISCELLANEOUS) ×3 IMPLANT
DRAPE LAPAROSCOPIC ABDOMINAL (DRAPES) ×3 IMPLANT
DRSG TEGADERM 4X4.75 (GAUZE/BANDAGES/DRESSINGS) ×6 IMPLANT
ELECT REM PT RETURN 15FT ADLT (MISCELLANEOUS) ×3 IMPLANT
GAUZE SPONGE 4X4 12PLY STRL (GAUZE/BANDAGES/DRESSINGS) IMPLANT
GLOVE BIO SURGEON STRL SZ 6 (GLOVE) ×3 IMPLANT
GLOVE INDICATOR 6.5 STRL GRN (GLOVE) ×3 IMPLANT
GOWN STRL REUS W/TWL LRG LVL3 (GOWN DISPOSABLE) ×3 IMPLANT
GOWN STRL REUS W/TWL XL LVL3 (GOWN DISPOSABLE) ×3 IMPLANT
KIT BASIN OR (CUSTOM PROCEDURE TRAY) ×3 IMPLANT
NEEDLE HYPO 22GX1.5 SAFETY (NEEDLE) ×3 IMPLANT
PACK GENERAL/GYN (CUSTOM PROCEDURE TRAY) ×3 IMPLANT
STRIP CLOSURE SKIN 1/2X4 (GAUZE/BANDAGES/DRESSINGS) ×6 IMPLANT
SUT ETHIBOND 0 MO6 C/R (SUTURE) ×3 IMPLANT
SUT MNCRL AB 4-0 PS2 18 (SUTURE) ×3 IMPLANT
SUT PROLENE 2 0 CT2 30 (SUTURE) IMPLANT
SUT VIC AB 3-0 SH 27 (SUTURE) ×1
SUT VIC AB 3-0 SH 27XBRD (SUTURE) ×2 IMPLANT
SYR CONTROL 10ML LL (SYRINGE) ×3 IMPLANT
TOWEL OR 17X26 10 PK STRL BLUE (TOWEL DISPOSABLE) ×3 IMPLANT
TOWEL OR NON WOVEN STRL DISP B (DISPOSABLE) ×3 IMPLANT

## 2017-11-03 NOTE — Anesthesia Preprocedure Evaluation (Signed)
Anesthesia Evaluation  Patient identified by MRN, date of birth, ID band Patient awake    Reviewed: Allergy & Precautions, NPO status , Patient's Chart, lab work & pertinent test results  Airway Mallampati: I  TM Distance: >3 FB Neck ROM: Full    Dental   Pulmonary    Pulmonary exam normal        Cardiovascular hypertension, Pt. on medications Normal cardiovascular exam     Neuro/Psych    GI/Hepatic   Endo/Other    Renal/GU      Musculoskeletal   Abdominal   Peds  Hematology   Anesthesia Other Findings   Reproductive/Obstetrics                             Anesthesia Physical Anesthesia Plan  ASA: II  Anesthesia Plan: General   Post-op Pain Management:    Induction: Intravenous  PONV Risk Score and Plan: 2 and Ondansetron, Midazolam and Dexamethasone  Airway Management Planned: Oral ETT  Additional Equipment:   Intra-op Plan:   Post-operative Plan: Extubation in OR  Informed Consent: I have reviewed the patients History and Physical, chart, labs and discussed the procedure including the risks, benefits and alternatives for the proposed anesthesia with the patient or authorized representative who has indicated his/her understanding and acceptance.     Plan Discussed with: CRNA and Surgeon  Anesthesia Plan Comments:         Anesthesia Quick Evaluation  

## 2017-11-03 NOTE — Op Note (Signed)
Operative Note  [facility]  [id number]  [id number]  11/03/2017   Surgeon: Vikki Ports A ConnorMD  Assistant: none  Procedure performed: umbilical hernia repair   Preop diagnosis: incarcerated umbilical hernia Post-op diagnosis/intraop findings: same  Specimens: no Retained items: no EBL: minimal cc Complications: none  Description of procedure: After obtaining informed consent the patient was taken to the operating room and placed supine on operating room table wheregeneral anesthesia was initiated, preoperative antibiotics were administered, SCDs applied, and a formal timeout was performed. The abdomen was prepped and draped  In the usual sterile fashion. A curvilinear infraumbilical incision was made sharply and then blunt dissection was used to dissect the hernia sac from the overlying subcutaneous tissues. The umbilical stalk was circumferentially dissected and then divided with cautery from the underlying fascia.  The fascia surrounding the hernia defect was circumferentially cleared off with cautery. The hernia sac was then excised at the level of the fascia using cautery. The sac contained chronically incarcerated omentum. This was excised and discarded. Hemostasis ensured and the remainder was reduced into the abdomen. The fascial defect was approximately 1cm in diameter. This was closed transversely with interrupted 0 ethibonds. The abdominal wall surrounding the umbilicus was infiltrated with local. Hemostasis was ensured within the wound. The umbilical skin was then tacked back down to the fascia with a 3-0 vicryl. Some of the excess skin was excised with cautery and then the incision was closed with running subcuticular monocryl. Steri strips, cotton ball and tegaderm dressing were applied. The patient was then awakened, extubated and taken to PACU in stable condition.   All counts were correct at the completion of the case.

## 2017-11-03 NOTE — Anesthesia Procedure Notes (Signed)
Procedure Name: Intubation Date/Time: 11/03/2017 7:35 AM Performed by: Montel Clock, CRNA Pre-anesthesia Checklist: Patient identified, Emergency Drugs available, Suction available, Patient being monitored and Timeout performed Patient Re-evaluated:Patient Re-evaluated prior to induction Oxygen Delivery Method: Circle system utilized Preoxygenation: Pre-oxygenation with 100% oxygen Induction Type: IV induction Ventilation: Mask ventilation without difficulty and Oral airway inserted - appropriate to patient size Laryngoscope Size: Mac and 3 Grade View: Grade I Tube type: Oral Tube size: 7.5 mm Number of attempts: 1 Airway Equipment and Method: Stylet Placement Confirmation: ETT inserted through vocal cords under direct vision,  positive ETCO2 and breath sounds checked- equal and bilateral Secured at: 22 cm Tube secured with: Tape Dental Injury: Teeth and Oropharynx as per pre-operative assessment  Comments: Intubation by paramedic student Tom.

## 2017-11-03 NOTE — Interval H&P Note (Signed)
History and Physical Interval Note:  11/03/2017 7:16 AM  Samuel [name]  has presented today for surgery, with the diagnosis of incarcerated umblical hernia  The various methods of treatment have been discussed with the patient and family. After consideration of risks, benefits and other options for treatment, the patient has consented to  Procedure(s): UMBILICAL HERNIA REPAIR WITH POSSIBLE MESH (N/A) INSERTION OF MESH (N/A) as a surgical intervention .  The patient's history has been reviewed, patient examined, no change in status, stable for surgery.  I have reviewed the patient's chart and labs.  Questions were answered to the patient's satisfaction.     Chelsea Rich Brave

## 2017-11-03 NOTE — Anesthesia Postprocedure Evaluation (Signed)
Anesthesia Post Note  Patient: ASAAD GULLEY  Procedure(s) Performed: UMBILICAL HERNIA REPAIR (N/A Abdomen)     Patient location during evaluation: PACU Anesthesia Type: General Level of consciousness: awake and alert Pain management: pain level controlled Vital Signs Assessment: post-procedure vital signs reviewed and stable Respiratory status: spontaneous breathing, nonlabored ventilation, respiratory function stable and patient connected to nasal cannula oxygen Cardiovascular status: blood pressure returned to baseline and stable Postop Assessment: no apparent nausea or vomiting Anesthetic complications: no    Last Vitals:  Vitals:   11/03/17 0929 11/03/17 1003  BP: (!) 141/95 (!) 143/86  Pulse: 78 74  Resp: 16 16  Temp: 36.6 C 36.7 C  SpO2: 100% 97%    Last Pain:  Vitals:   11/03/17 1003  TempSrc:   PainSc: 5                  OSSEY,KEVIN DAVID

## 2017-11-03 NOTE — Discharge Instructions (Signed)
HERNIA REPAIR: POST OP INSTRUCTIONS ° °###################################################################### ° °EAT °Gradually transition to a high fiber diet with a fiber supplement over the next few weeks after discharge.  Start with a pureed / full liquid diet (see below) ° °WALK °Walk an hour a day.  Control your pain to do that.   ° °CONTROL PAIN °Control pain so that you can walk, sleep, tolerate sneezing/coughing, and go up/down stairs. ° °HAVE A BOWEL MOVEMENT DAILY °Keep your bowels regular to avoid problems.  OK to try a laxative to override constipation.  OK to use an antidairrheal to slow down diarrhea.  Call if not better after 2 tries ° °CALL IF YOU HAVE PROBLEMS/CONCERNS °Call if you are still struggling despite following these instructions. °Call if you have concerns not answered by these instructions ° °###################################################################### ° ° ° °1. DIET: Follow a light bland diet the first 24 hours after arrival home, such as soup, liquids, crackers, etc.  Be sure to include lots of fluids daily.  Advance to a low fat / high fiber diet over the next few days after surgery.  Avoid fast food or heavy meals the first week as your are more likely to get nauseated.   ° °2. Take your usually prescribed home medications unless otherwise directed. ° °3. PAIN CONTROL: °a. Pain is best controlled by a usual combination of three different methods TOGETHER: °i. Ice/Heat °ii. Over the counter pain medication °iii. Prescription pain medication °b. Most patients will experience some swelling and bruising around the hernia(s) such as the bellybutton, groins, or old incisions.  Ice packs or heating pads (30-60 minutes up to 6 times a day) will help. Use ice for the first few days to help decrease swelling and bruising, then switch to heat to help relax tight/sore spots and speed recovery.  Some people prefer to use ice alone, heat alone, alternating between ice & heat.  Experiment  to what works for you.  Swelling and bruising can take several weeks to resolve.   °c. It is helpful to take an over-the-counter pain medication regularly for the first few weeks.  Choose one of the following that works best for you: °i. Naproxen (Aleve, etc)  Two 220mg tabs twice a day °ii. Ibuprofen (Advil, etc) Three 200mg tabs four times a day (every meal & bedtime) °iii. Acetaminophen (Tylenol, etc) 325-650mg four times a day (every meal & bedtime) °d. A  prescription for pain medication should be given to you upon discharge.  Take your pain medication as prescribed.  °i. If you are having problems/concerns with the prescription medicine (does not control pain, nausea, vomiting, rash, itching, etc), please call us [phone] to see if we need to switch you to a different pain medicine that will work better for you and/or control your side effect better. °ii. If you need a refill on your pain medication, please contact your pharmacy.  They will contact our office to request authorization. Prescriptions will not be filled after 5 pm or on week-ends. ° °4. Avoid getting constipated.  Between the surgery and the pain medications, it is common to experience some constipation.  Increasing fluid intake and taking a fiber supplement (such as Metamucil, Citrucel, FiberCon, MiraLax, etc) 1-2 times a day regularly will usually help prevent this problem from occurring.  A mild laxative (prune juice, Milk of Magnesia, MiraLax, etc) should be taken according to package directions if there are no bowel movements after 48 hours.   ° °5. Wash / shower every   day starting on post-op day 2.  You may shower over the dressings as they are waterproof.    6. Remove your waterproof bandages 2 days after surgery.  Steri strips will peel off after 1-2 weeks. You may leave the incisions open to air.  You may replace a dressing/Band-Aid to cover an incision for comfort if you wish.  Continue to shower over incision(s) after the  dressing is off. No rubbing, scrubbing, lotions or ointments to incision.  7. ACTIVITIES as tolerated:   a. You may resume regular (light) daily activities beginning the next day--such as daily self-care, walking, climbing stairs--gradually increasing activities as tolerated.  If you can walk 30 minutes without difficulty, it is safe to try more intense activity such as jogging, treadmill, bicycling, low-impact aerobics, swimming, etc. b. Refrain from the most intensive and strenuous activity such as sit-ups, heavy lifting, contact sports, etc  Refrain from any heavy lifting or straining until 6 weeks after surgery.   c. DO NOT PUSH THROUGH PAIN.  Let pain be your guide: If it hurts to do something, don't do it.  Pain is your body warning you to avoid that activity for another week until the pain goes down. d. You may drive when you are no longer taking prescription pain medication, you can comfortably wear a seatbelt, and you can safely maneuver your car and apply brakes. e. Dennis Bast may have sexual intercourse when it is comfortable.   8. FOLLOW UP in our office a. Please call [facility] at [phone] to set up an appointment to see your surgeon in the office for a follow-up appointment approximately 2-3 weeks after your surgery. b. Make sure that you call for this appointment the day you arrive home to insure a convenient appointment time.  9.  If you have disability of FMLA / Family leave forms, please bring the forms to the office for processing.  (do not give to your surgeon).  WHEN TO CALL us [phone]: 1. Poor pain control 2. Reactions / problems with new medications (rash/itching, nausea, etc)  3. Fever over 101.5 F (38.5 C) 4. Inability to urinate 5. Nausea and/or vomiting 6. Worsening swelling or bruising 7. Continued bleeding from incision. 8. Increased pain, redness, or drainage from the incision   The clinic staff is available to answer your questions during regular business hours  (8:30am-5pm).  Please dont hesitate to call and ask to speak to one of our nurses for clinical concerns.   If you have a medical emergency, go to the nearest emergency room or call 911.  A surgeon from [facility] is always on call at the hospitals in [facility], [place], [place], [place], [place]  [address] ?  [address] [address], [place], [place]   [address] MAIN: [phone] ? TOLL FREE: [phone] ? FAX: [phone] [email or web address]

## 2017-11-03 NOTE — Transfer of Care (Signed)
Immediate Anesthesia Transfer of Care Note  Patient: Samuel [name]  Procedure(s) Performed: UMBILICAL HERNIA REPAIR (N/A Abdomen)  Patient Location: PACU  Anesthesia Type:General  Level of Consciousness: drowsy and patient cooperative  Airway & Oxygen Therapy: Patient Spontanous Breathing and Patient connected to face mask oxygen  Post-op Assessment: Report given to RN and Post -op Vital signs reviewed and stable  Post vital signs: Reviewed and stable  Last Vitals:  Vitals Value Taken Time  BP 146/75 11/03/2017  8:36 AM  Temp    Pulse 76 11/03/2017  8:38 AM  Resp 18 11/03/2017  8:37 AM  SpO2 100 % 11/03/2017  8:38 AM  Vitals shown include unvalidated device data.  Last Pain:  Vitals:   11/03/17 0609  TempSrc: Oral      Patients Stated Pain Goal: 4 (71/16/57 9038)  Complications: No apparent anesthesia complications

## 2017-11-05 DIAGNOSIS — M1712 Unilateral primary osteoarthritis, left knee: Secondary | ICD-10-CM | POA: Diagnosis not present

## 2017-11-12 DIAGNOSIS — M170 Bilateral primary osteoarthritis of knee: Secondary | ICD-10-CM | POA: Diagnosis not present

## 2017-11-19 DIAGNOSIS — M1711 Unilateral primary osteoarthritis, right knee: Secondary | ICD-10-CM | POA: Diagnosis not present

## 2017-11-19 DIAGNOSIS — M1712 Unilateral primary osteoarthritis, left knee: Secondary | ICD-10-CM | POA: Diagnosis not present

## 2017-11-23 ENCOUNTER — Encounter: Payer: Self-pay | Admitting: Physician Assistant

## 2017-11-23 ENCOUNTER — Ambulatory Visit (INDEPENDENT_AMBULATORY_CARE_PROVIDER_SITE_OTHER): Payer: Medicare Other | Admitting: Physician Assistant

## 2017-11-23 ENCOUNTER — Other Ambulatory Visit: Payer: Self-pay

## 2017-11-23 VITALS — BP 128/64 | HR 72 | Temp 97.6°F | Resp 18 | Ht 66.0 in | Wt 250.8 lb

## 2017-11-23 DIAGNOSIS — K429 Umbilical hernia without obstruction or gangrene: Secondary | ICD-10-CM | POA: Diagnosis not present

## 2017-11-23 DIAGNOSIS — R05 Cough: Secondary | ICD-10-CM

## 2017-11-23 DIAGNOSIS — F1011 Alcohol abuse, in remission: Secondary | ICD-10-CM

## 2017-11-23 DIAGNOSIS — N529 Male erectile dysfunction, unspecified: Secondary | ICD-10-CM

## 2017-11-23 DIAGNOSIS — R053 Chronic cough: Secondary | ICD-10-CM

## 2017-11-23 DIAGNOSIS — I10 Essential (primary) hypertension: Secondary | ICD-10-CM

## 2017-11-23 MED ORDER — SILDENAFIL CITRATE 100 MG PO TABS: 100.0000 mg | ORAL_TABLET | ORAL | 11 refills | Status: DC | PRN

## 2017-11-23 NOTE — Progress Notes (Signed)
Patient ID: Samuel [name] MRN: [id number], DOB: [birth year], 62 y.o. Date of Encounter: @DATE @  Chief Complaint:  Chief Complaint  Patient presents with  . need handicap placard filled out  . discuss blood pressure    HPI: 62 y.o. year old male  presents with above.    05/27/2017: He presents as a new patient to establish care.  He is scheduled for right total knee replacement on Monday 06/01/2017 with Dr. Alvan Dame. However, he states that he already realizes that this is probably going to have to be postponed/rescheduled and I have told him today that it definitely will need to be.  I informed him to call [facility] and inform them of today's visit and plans and need to postpone surgery. In regards to the right total knee replacement he also says "I get myself all worked up-----I have heard so many stories of people having knee replacement surgery and that the first 20 days are hell"--- "and that he is concerned because he cannot take any type of opioids because they cause him itching so is concerned that he cannot take pain medicines and the amount of pain that he will have."  He reports that he had a cardiac catheterization about 10 years ago at [facility].  Says that he was told that there was some blockage but not significant enough to require stents.  However says "but that was 10 years ago so I do not know if things have changed since then ".  Says that recently when he walks to the mailbox and back, he is significantly short of breath.  States that this is new.  Names off activities that he used to be able to do-- with no shortness of breath / DOE.  Also discussed that his blood pressure is reading significantly high today.  Reports that in the past when he would be at work if he had a headache he would get the CNA at work to check his blood pressure and it would be high.  Says "then later the headache would be gone" /resolved and that he "would get his blood pressure rechecked  and it would be okay." He has no other known history of high blood pressure but also admits that he has never had a PCP until now.  Reports that he has been experiencing nausea and diarrhea for the past 3 days.  Has had no vomiting.  Says that about 3 days ago the vomiting was significant frequency.  Says that yesterday he did not eat and his stomach would feel upset but then nothing would really come out because he had not eaten.  States that he ate again last night and then did have diarrhea again this morning. Later, when it came time to get labs and I was wondering whether he was fasting, if he had had anything to eat today-- he says "A couple of pieces of cheese."  Reports that he quit smoking 20 years ago.  Reports that he has no other known medical history.   AT THAT OV:  Started Lisinopril 10 mg daily for blood pressure. Checked screening labs.  Was not fasting so did not check lipid panel. CBC was normal.  HIV was negative.  PSA was normal.  AST and ALT were elevated.  Hepatitis panel was added and was negative.  Right upper quadrant ultrasound was ordered.  Patient states that this is scheduled for tomorrow. Also placed referral to Cardiology.  Had visit with Dr. Acie Fredrickson 06/02/17.  I reviewed that office note.  Blood Pressure was still elevated at that visit.  Added HCTZ 25 mg daily and K-Dur 10 milliequivalent daily in addition to the lisinopril. Today patient reports that he also discussed diet and exercise a lot with Dr. Acie Fredrickson.  Pt states that he has cut out carbohydrates and is starting silver sneakers on January 1.  We also discussed the elevated LFTs. Patient reports that he was taking Tylenol every day because of his knee pain.  Sometimes was taking twice daily. He also reports that he was "drinking like a fish ".  Says that he was drinking about 10 beers every single day.  Says "that was his past time." Says that he has seen people who have stopped drinking cold [place] and  knows that that will cause DTs.  Therefore did not stop cold [place].  Has gradually weaned off of the alcohol.  Is down to drinking just 1 or 2 beers in the evening.  Plans to completely stop this but did not want to cause DTs.    06/11/2017: Reports that he is taking the lisinopril 10 mg daily.  Also is taking the HCTZ 25 mg daily and K-Dur 10 milliequivalent daily. He states that he had the stress test yesterday.  Is scheduled for the right upper quadrant ultrasound tomorrow.  Scheduled to see Dr. Acie Fredrickson back for follow-up visit February 4.  States that he is supposed to go have fasting labs done that day. Reports that he has already lost about 10 pounds and has decreased 1-2  inches in his waist. Discussed that my concern of possible sleep apnea.  He states that he knows that he does snore loudly but has not been told of any periods of sounding like he stops breathing.  Says that he sleeps for about 2 hours and then gets up to take the dogs out and then sleeps another 2 hours and then gets up again. So discussed that since he is having so many lifestyle changes and is having weight reduction will wait to discuss testing for sleep apnea. Says that Dr. Acie Fredrickson also discussed that he may not even have to have knee surgery if he loses weight and this has really been a motivating for him. He reports that he has been noticing these bumps and red areas on his cheeks of his skin on his face.  No other specific concerns to address today.    09/17/2017: Says symptoms started 10 days ago.  Says he has been at [facility], that is why he is just now coming in to be seen.  Just got out of [facility] 3 hours ago.  His wife accompanies him for OV today.  He says he tried to quit alcohol at home but got DTs so realized he had to go somewhere that could help him.   Reports that his throat has felt sore for 10 days. Says he has been "getting out the nastiest looking phlegm you've ever seen". Has chest  congestion. No known fevers, chills.   At that OV--prescribed meds for his respiratory infection, recommended that he schedule a follow-up visit for [place] to update regarding his general medical care. He is scheduling f/u OV for 30 minute slot in 1 -2 weeks--for routine f/u --- he has a lot going on, recent cardiac cath, etc.    09/24/2017: He presents for that follow-up visit today. Talks about having pain in multiple areas including his knees his hands his back etc.  Mentions  rheumatoid arthritis and asks if he should see a rheumatologist.  However also in that same breath, he references things that he knows he has put his body through and "being banged up".  Explained that rheumatoid arthritis is a different condition and that what he is referring to is osteoarthritis.  Discussed that for osteoarthritis there would not be a lot of additional treatment from a rheumatologist.  Then starts discussing his knees and says that he has appointment to see orthopedics on the 17th and says that he wants to do the injection series again if the orthopedic agrees with that.  Says that he is losing weight and is hoping that will help his knees.  He states that in regards to the alcohol that this Saturday will be day 30 of him being alcohol free.  Says that he and his wife are going to meetings every single day.  Says that his wife does not drink alcohol but that she is going with him everywhere he goes.  Says "he does not leave the house without her -- she is my body guard -- she makes sure I do not stop at a convenient store etc. ".  Says that for now he is going to the meetings every day and that he will be getting a sponsor soon.  Lifts his shirt and shows me large umbilical hernia and asks what he can do about that.  Says that he is concerned that it is going to cause problems one day.  He reports that he occasionally still has a cough.  Says that he will go all day without a cough and then all of a sudden will  feel a tickle in his throat and then starts coughing and cannot stop.   Says that since taking the antibiotic I gave him, that the phlegm color has changed but still has occasional episode of cough.  Today I reviewed Dr. Elmarie Shiley office note from 07/20/17.  Reviewed that patient had a Myoview study that was abnormal.  Cardiac catheterization on December 24 revealed mild to moderate nonobstructive CAD.  Distal branches are small and diffusely diseased.  Normal LV systolic function.  Today patient states "he pretty much told me I could just go back there on an as-needed basis ".  Today I reviewed that he had a lipid panel 06/12/17 that showed triglycerides 73, HDL 49, LDL 112. PSA was checked 05/2017 and was normal. Today I discussed that the only other cancer screening that is recommended is screening for colorectal cancer.  He does not want to proceed with a colonoscopy or any screening right now.  Says that prior to coming to see me recently he had not had any medical care in years and now all of a sudden has so much going on-- wants to put this on hold.  Today I also reviewed that at his initial visit with me I was concerned of possible sleep apnea.  He reports that his wife has told him that he snores but that when he was at [facility] they told him he did not snore there.  Also he is losing weight right now with diet and exercise.  Also, he reports that he does feel rested when he wakes up in the morning.  He wants to hold off on sleep study at this point.  No other concerns to address today.  Addendum Added 10/05/2017: Received note from [facility]. Date of encounter: 09/30/2017 Audiological Testing was performed.  They recommended certain hearing aids or  reprogramming his current aids. He was going to check into insurance coverage then f/u with them.   AT THAT OV ON 6/64/4034: Umbilical hernia----placed referral to general surgery Hypertension------- concerned that cough may be  secondary to ACE inhibitor.   ---------------------------Discontinued lisinopril and replaced with losartan 50 mg daily.   ---------------------------Planned for him return in 2 weeks to recheck BP and BMET after med change.   ---------------------------Also with follow-up to see if cough resolved after med change.    10/08/2017: Today he reports that he did discontinue the lisinopril and did start the losartan 50 mg daily.   However, he reports that he continues to have the cough.  Says that he "seems to get the coughing spells at the most inconvenient times-- at times that he supposed to be quiet-- like when he is in church or sitting in [place] meeting."  He does have phlegm present.  Says that if he does blow his nose, that there is thick mucus there-- but mostly is not having much come out of his nose-- mostly is feeling drainage in his throat and chest. He has not heard yet regarding appointment with surgeon. He is glad that he has lost a few more pounds today even since his most recent visit. Says that he has stayed off of the alcohol.  Says that "his wife has backed off some".  Says that "this is a problem with me, not her", Says that he now has a sponsor and is continuing to go to the meetings every night.  Says "I am seeing too many positives to go back to ever drinking ".   "I realize I was living in such a haze ".  Also is feeling better and losing weight and knows that it is going to help improve his health in addition to improving other areas of his life.  He has no other specific concerns to address. AT THAT OV: --Prescribed Levaquin. And Hycodan. --Continued current BP meds. Checked lab to monitor since changed ACE Inh to ARB at [facility]   10/22/2017:  Today I reviewed phone notes that are documented since last visit. On 10/15/2017 patient called in stating that the losartan 50 mg was causing him to have dizziness.   Reported that the Levaquin and Hycodan had helped with his cough.  Was  wondering if he could go back to the lisinopril. I replied that --yes-- he could discontinue the losartan and return to the lisinopril.  Today he reports: Today he reports that when he was on the losartan he had some episodes of feeling lightheaded like he was going to pass out.  Says "it could have had to do with what I was doing though ".  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  Had to sit down and drink some water.  However, he states "that pill made me feel bad so I stopped it and went back to the lisinopril. I would rather have a cough." Today we reviewed all the other things that have been changes that have been occurring in his body in addition to the changes with the blood pressure medicines.   That he has lost almost 30 pounds in just 5 months.   He brings up his weight loss during the visit and is excited that he has had to buy new  clothes because the other ones are too big.   Asked what he had changed other than stopping the alcohol.   His response is "the all-you-can-eat [place] buffet ".   Says "now he is eating low to no carbs".   "Now he is eating small portions instead of eating until I feel full ".   Is pretty much cut out potato pasta rice.   Says but I do drink [place] coffee in the morning-- he says that there are 4 teaspoons of sugar and espresso.  Says that he is getting all that sugar at that point right in the morning but otherwise very little carbohydrates or sugar.  He reports that he did have visit with the surgeon and I noted that I did get note from them and they are going to proceed with repair to his umbilical hernia.  He states that the surgery scheduler has been calling him but they "are playing phone tag" and he will make sure to talk to them today.  Reports that even though he has had these couple  episodes of lightheadedness when working in the heat that he has continued to get good blood pressure readings at home.  Says that even this morning got reading of 137/70.  However, notes that when he did go to the surgeon's office they did mention getting 824 systolic reading.  Reports that since he took the Levaquin all of his phlegm has resolved and cough has resolved.  Also states that regarding his left knee-- he is going to start the viscous supplement injections-- for a set of 3-- and will start at the end of this month ".   Says "after he does the series of 3 to the left knee then going to do the right one ".  He is staying off of the alcohol.  Is continuing to go to the meetings etc.  No other concerns to address today.    11/02/2017:  A/P AT LOV 10/22/2017:  I rechecked blood pressure today myself and I am getting 110/68. Nurse check today was 99/60.  He reports that at the surgeon's visit this week was 235 systolic. At this point I have told him to stop the losartan and the lisinopril both. Lisinopril was most likely the cause of his cough so this needs to be stopped. At this point he is convinced that the losartan directly is causing his lightheadedness so we will keep him off of this medication.( even though lightheadedness secondary low BP in general/ secondary to combination of alcohol cessation, diet changes, weight loss, HCTZ) At this point we will have him just take the HCTZ for hypertension and we will have him return for follow-up in 1 week. They reviewed that his weight is down 28 pounds compared to his visit in December.  Discussed that with this weight loss makes sense that he is not requiring as much blood pressure medication and that his blood pressure is improving. Follow up visit in 1 week.  As well he is to bring blood pressure log from home BP readings to that visit to review.   Today pt reports that he did stop both the losartan and lisinopril as directed. He  reports that he has been checking blood pressure at home since then.  Systolic read 361 once, but at other times,  all other systolic readings in the 443X to 140s.  Systolic never less than 540.  Diastolic ranged from 08-67.  Today I reviewed the fact that we had discussed  possible sleep apnea and need for sleep study in the past.   Also reviewed that his recent preop evaluation screen showed concern for possible sleep apnea.   Again today he states that he wants to put that on hold.   States that he has too many other things going on regarding medical care right now.   Tomorrow has the surgery for his umbilical hernia repair.   Wednesday has injection to the left knee.   Also says that he "sleeps like a baby" as far as he can tell.  Today he reports that "the cough is back."   States that it is a dry, hacky cough.   States that once he starts coughing, he cannot stop.   States that he does not feel any drainage or phlegm in his throat and is not coughing up any kind of phlegm or drainage.  Is feeling no sore throat.   Says that it feels like something deep down in his throat at times and that is what starts the coughing to start. He is having no symptoms of classic heartburn.  Is not tasting acid like vomit in his mouth/throat.  A/P AT OV 11/02/2017: 1. Essential hypertension At this time we will keep him off of ACE inhibitor giving issues with cough. At this time we will keep him off ARB given that he was convinced that it was the losartan that was causing some of his symptoms in the past At this time will continue the HCTZ At this time we will add Norvasc 5 mg daily to take with the HCTZ. Have him return for follow-up visit in 2 weeks to recheck BP on these medications. - amLODipine (NORVASC) 5 MG tablet; Take 1 tablet (5 mg total) by mouth daily.  Dispense: 30 tablet; Refill: 11  2. Chronic cough He has been treated with Levaquin and cough has reoccurred. He is now off ACE inhibitor  and cough is still reoccurring. Will further evaluate cause of cough--- obtain chest x-ray.  Add Zyrtec daily to dry up any drainage as a trial of treatment for possible postnasal drip.  Add PPI to treat possible underlying GERD. If chest x-ray is negative and symptoms persist after taking Zyrtec and PPI--- then will consider referral to ENT for possible laryngoscopy. He will return for follow-up visit in 2 weeks and will follow-up this issue at that visit. - DG Chest 2 View; Future - Cetirizine HCl (ZYRTEC ALLERGY) 10 MG CAPS; Take one daily.  Dispense: 30 capsule; Refill: 5 - omeprazole (PRILOSEC) 20 MG capsule; Take 1 capsule (20 mg total) by mouth daily.  Dispense: 30 capsule; Refill: 3     11/23/2017: Today I reviewed that he has lost another 8 pounds since his last visit on 11/02/2017. Today he states that since he has lost his weight he is feeling so much better he has been "wanting to be with his wife more" and " I know I'm getting older, but things aren't working like they used to" Is wanting to try some medication to help with this. Also notes that he finished the series of 3 injections to the left knee.  Has gotten the first 2 on the right knee and is going for the third injection to the right knee on Wednesday. Also states that this Thursday will be 90 days of alcohol free. Says that he has made very strict diet changes along with the alcohol cessation and says this is why he is continuing to lose some much weight.  Says that for breakfast he is now eating 1 egg white whereas before he was eating multiple eggs and bacon etc.  Also says that he has cut out all of the potatoes rice all carbs are pretty well cut out completely. Reviewed that his blood pressure is reading great today.  128/64. Asked about the cough.  States that the cough has resolved.  He states that actually he thinks that when they did the intubation for his hernia surgery that that cleared out what ever was there that  was causing the cough.  Says that he eats a lot of popcorn and really thinks that it might have been a popcorn kernel stuck in his throat because that is what it felt like-- and b/c the cough has completely resolved since the intubation.  Says that he had gotten the medications filled for the omeprazole and Zyrtec but never started them and that he has had no cough since the intubation from the surgery. Also reviewed that he had umbilical hernia surgery on 11/03/2017.  Shows me that result today which was good.   Notes that he is going to the beach this week (with his new body!and Viagra)!.    Past Medical History:  Diagnosis Date  . Arthritis   . HTN (hypertension)      Home Meds: Outpatient Medications Prior to Visit  Medication Sig Dispense Refill  . amLODipine (NORVASC) 5 MG tablet Take 1 tablet (5 mg total) by mouth daily. 30 tablet 11  . aspirin EC 81 MG tablet Take 1 tablet (81 mg total) by mouth daily.    . Cetirizine HCl (ZYRTEC ALLERGY) 10 MG CAPS Take one daily. 30 capsule 5  . docusate sodium (COLACE) 100 MG capsule Take 1 capsule (100 mg total) by mouth 2 (two) times daily. 60 capsule 0  . HYDROcodone-acetaminophen (NORCO/VICODIN) 5-325 MG tablet Take 1 tablet by mouth every 6 (six) hours as needed for moderate pain. 20 tablet 0  . ibuprofen (ADVIL,MOTRIN) 200 MG tablet Take 400 mg by mouth 4 (four) times daily as needed for moderate pain.     . metroNIDAZOLE (METROGEL) 1 % gel Apply topically daily. (Patient taking differently: Apply 1 application topically daily as needed. ) 45 g 0  . omeprazole (PRILOSEC) 20 MG capsule Take 1 capsule (20 mg total) by mouth daily. 30 capsule 3  . hydrochlorothiazide (HYDRODIURIL) 25 MG tablet Take 1 tablet (25 mg total) by mouth daily. 90 tablet 3   No facility-administered medications prior to visit.     Allergies:  Allergies  Allergen Reactions  . Other     Opioids - "scratch my face off"     Social History   Socioeconomic History   . Marital status: Married    Spouse name: Not on file  . Number of children: Not on file  . Years of education: Not on file  . Highest education level: Not on file  Occupational History  . Not on file  Social Needs  . Financial resource strain: Not on file  . Food insecurity:    Worry: Not on file    Inability: Not on file  . Transportation needs:    Medical: Not on file    Non-medical: Not on file  Tobacco Use  . Smoking status: Never Smoker  . Smokeless tobacco: Never Used  Substance and Sexual Activity  . Alcohol use: No    Comment: 63 days sober from today 10/28/2017  . Drug use: No  . Sexual activity:  Not on file  Lifestyle  . Physical activity:    Days per week: Not on file    Minutes per session: Not on file  . Stress: Not on file  Relationships  . Social connections:    Talks on phone: Not on file    Gets together: Not on file    Attends religious service: Not on file    Active member of club or organization: Not on file    Attends meetings of clubs or organizations: Not on file    Relationship status: Not on file  . Intimate partner violence:    Fear of current or ex partner: Not on file    Emotionally abused: Not on file    Physically abused: Not on file    Forced sexual activity: Not on file  Other Topics Concern  . Not on file  Social History Narrative  . Not on file    Family History  Problem Relation Age of Onset  . Dementia Mother   . Heart attack Father   . CAD Brother      Review of Systems:  See HPI for pertinent ROS. All other ROS negative.    Physical Exam: Blood pressure 128/64, pulse 72, temperature 97.6 F (36.4 C), temperature source Oral, resp. rate 18, height 5\' 6"  (1.676 m), weight 113.8 kg (250 lb 12.8 oz), SpO2 99 %., Body mass index is 40.48 kg/m. General: WNWD WM. Appears in no acute distress. Neck: Supple. No thyromegaly. No lymphadenopathy. Lungs: Clear bilaterally to auscultation without wheezes, rales, or rhonchi.  Breathing is unlabored. Heart: RRR with S1 S2. No murmurs, rubs, or gallops. Abdomen: Soft, non-tender, non-distended with normoactive bowel sounds. No hepatomegaly. No rebound/guarding. No obvious abdominal masses. Hernia resolved--Umbilical site looks good.  Musculoskeletal:  Strength and tone normal for age. Extremities/Skin: Warm and dry.  No LE edema.  Neuro: Alert and oriented X 3. Moves all extremities spontaneously. Gait is normal. CNII-XII grossly in tact. Psych:  Responds to questions appropriately with a normal affect.     ASSESSMENT AND PLAN:  62 y.o. year old male with   1. Essential hypertension 11/23/2017: Blood Pressure is now at goal/well-controlled.  Continue current medications.  Will have him return for follow-up in 1 month.  Given that he is continuing to have significant weight loss will have him return for follow-up.  May eventually be able to decrease BP meds if continues with additional weight loss.  2. Chronic cough 11/23/2017: Today he reports that the cough has completely resolved since the intubation with his hernia surgery. We will follow-up again at his next visit in 1 month to make sure that this cough has continued to be resolved and has not reoccurred. If cough reoccurs then will need to take the PPI and Zyrtec. If cough persists would have low threshold for referral to ENT for laryngoscopy or GI for EGD especially given his history of alcohol abuse.   3. Severe obesity (BMI >= 40) ([place]) 11/23/2017: Continues to have significant weight loss.  Has stopped alcohol and also has made significant diet changes. 05/27/2017 weight was 286 09/24/2017 weight 266 10/22/2017 weight 258  11/23/2017 weight 250 Have him return for follow-up visit in 1 month given that he is having continued significant weight loss to monitor blood pressure etc.   4. Alcohol abuse, in remission 11/23/2017: Today he reports that this Thursday will be 90 days of alcohol free.  He is  continuing AA.   5. Umbilical hernia without obstruction  and without gangrene 11/23/2017: He had surgery for your umbilical hernia repair on 11/03/2017 with good result.   6. Erectile dysfunction, unspecified erectile dysfunction type 11/23/2017: We will give Viagra to use as directed.  He is to try taking just a half a tablet.  We will follow-up this issue at follow-up visit 1 month.  - sildenafil (VIAGRA) 100 MG tablet; Take 1 tablet (100 mg total) by mouth as needed for erectile dysfunction.  Dispense: 3 tablet; Refill: 11    7. Subclinical hypothyroidism 10/08/2017:  Lab 05/27/2017 showed elevated TSH but normal free T4.  We will recheck labs in 6 months.--Due to recheck 11/2017 --------------------------------------------11/23/2017:  --Due to recheck 11/2017--------------------------------------------------------------------------------------------------------------------------------------------------------    Rosacea 09/24/2017:   At visit 06/11/2017 I was not aware of his significant alcohol use.  This was most likely the underlying cause of his rosacea.  Alcohol has now been discontinued.  At that visit I did prescribe MetroGel and doxycycline for rosacea. 10/08/2017:  Cont alcohol abstinence.  Continue the Flagyl and doxycycline if needed.   Elevated liver function tests 06/11/2017: Hepatitis panel was negative.  He reports that he was taking Tylenol 1-2 times every single day and was drinking about 10 beers per day.  Is totally stop the Tylenol and has been weaning off of the ----------alcohol.  Is scheduled for right upper quadrant ultrasound tomorrow. ---------Suspect that the elevated LFTs were secondary to the alcohol use so we will recheck these in the future once he is off alcohol.    Possible OSA 05/27/2017--Will discuss at future OV. He has very large neck, HTN, Obesity---- high risk for OSA---will discuss at future OV 06/11/2017: He is being extremely compliant with  lifestyle changes and has made lots of changes to his diet and is starting to exercise. 09/24/2017: See HPI for details, update regarding this.  Will hold off on sleep study at this time. 11/02/2017---See HPI for details. Discussed that PreOp Eval also showed + screen, concern for possible osa. See HPI for details.   Plan follow-up visit with me in 1 month.  F/U sooner if needed.   [address] [place], [place], [facility] 11/23/2017 12:04 PM

## 2017-11-25 DIAGNOSIS — M1711 Unilateral primary osteoarthritis, right knee: Secondary | ICD-10-CM | POA: Diagnosis not present

## 2017-11-26 ENCOUNTER — Ambulatory Visit: Payer: Medicare Other | Admitting: Physician Assistant

## 2017-12-22 ENCOUNTER — Telehealth: Payer: Self-pay

## 2017-12-22 NOTE — Telephone Encounter (Signed)
Orlena Sheldon, PA-C  Johnson, Tiffany A, [name]        At his OVs with me-- I had discussed my concern that he may have sleep apnea and I had recommended sleep study.  He felt like he had too many other things going on with his health and wanted to put that on hold.  He recently went for pre-op eval to [facility] for hernia surgery.   Inform him that their screening suggests sleep apnea and they are recommending sleep study.  See if he is ok for [place] to go ahead and order sleep study.   Previous Messages        Attached Notes   Progress Notes by Geri Seminole, RN at 10/28/2017 8:43 AM   Author: Geri Seminole, RN Service: - Author Type: Registered Nurse  Filed: 10/28/2017 8:43 AM Date of Service: 10/28/2017 8:43 AM Note Type: Progress Notes  Status: Signed Editor: Geri Seminole, RN (Registered Nurse)      10/28/17 [phone]  OBSTRUCTIVE SLEEP APNEA  Have you ever been diagnosed with sleep apnea through a sleep study? No  Do you snore loudly (loud enough to be heard through closed doors)?  1  Do you often feel tired, fatigued, or sleepy during the daytime (such as falling asleep during driving or talking to someone)? 1  Has anyone observed you stop breathing during your sleep? 0  Do you have, or are you being treated for high blood pressure? 1  BMI more than 35 kg/m2? 1  Age > 50 (1-yes) 1  Neck circumference greater than:Male 16 inches or larger, Male 17inches or larger? 1  Male Gender (Yes=1) 1  Obstructive Sleep Apnea Score 7  Score 5 or greater  Results sent to PCP       Call placed to patient and he states as of right now he does not want to have a sleep study done.

## 2017-12-28 ENCOUNTER — Encounter: Payer: Self-pay | Admitting: Physician Assistant

## 2017-12-28 ENCOUNTER — Ambulatory Visit (INDEPENDENT_AMBULATORY_CARE_PROVIDER_SITE_OTHER): Payer: Medicare Other | Admitting: Physician Assistant

## 2017-12-28 VITALS — BP 138/62 | HR 67 | Temp 97.8°F | Resp 20 | Ht 66.0 in | Wt 243.2 lb

## 2017-12-28 DIAGNOSIS — I83813 Varicose veins of bilateral lower extremities with pain: Secondary | ICD-10-CM | POA: Diagnosis not present

## 2017-12-28 DIAGNOSIS — E039 Hypothyroidism, unspecified: Secondary | ICD-10-CM | POA: Diagnosis not present

## 2017-12-28 DIAGNOSIS — L309 Dermatitis, unspecified: Secondary | ICD-10-CM | POA: Diagnosis not present

## 2017-12-28 DIAGNOSIS — E038 Other specified hypothyroidism: Secondary | ICD-10-CM

## 2017-12-28 DIAGNOSIS — L719 Rosacea, unspecified: Secondary | ICD-10-CM

## 2017-12-28 DIAGNOSIS — I10 Essential (primary) hypertension: Secondary | ICD-10-CM

## 2017-12-28 DIAGNOSIS — R7989 Other specified abnormal findings of blood chemistry: Secondary | ICD-10-CM

## 2017-12-28 DIAGNOSIS — F1011 Alcohol abuse, in remission: Secondary | ICD-10-CM | POA: Diagnosis not present

## 2017-12-28 DIAGNOSIS — R945 Abnormal results of liver function studies: Secondary | ICD-10-CM | POA: Diagnosis not present

## 2017-12-28 MED ORDER — CLOTRIMAZOLE-BETAMETHASONE 1-0.05 % EX CREA: 1.0000 "application " | TOPICAL_CREAM | Freq: Two times a day (BID) | CUTANEOUS | 0 refills | Status: DC

## 2017-12-28 NOTE — Progress Notes (Signed)
Patient ID: Samuel [name] MRN: [id number], DOB: May 18, 1956, 62 y.o. Date of Encounter: @DATE @  Chief Complaint:  Chief Complaint  Patient presents with  . varicose veins on legs painful    HPI: 62 y.o. year old male  presents with above.    05/27/2017: He presents as a new patient to establish care.  He is scheduled for right total knee replacement on Monday 06/01/2017 with Dr. Alvan Dame. However, he states that he already realizes that this is probably going to have to be postponed/rescheduled and I have told him today that it definitely will need to be.  I informed him to call [facility] and inform them of today's visit and plans and need to postpone surgery. In regards to the right total knee replacement he also says "I get myself all worked up-----I have heard so many stories of people having knee replacement surgery and that the first 20 days are hell"--- "and that he is concerned because he cannot take any type of opioids because they cause him itching so is concerned that he cannot take pain medicines and the amount of pain that he will have."  He reports that he had a cardiac catheterization about 10 years ago at [facility].  Says that he was told that there was some blockage but not significant enough to require stents.  However says "but that was 10 years ago so I do not know if things have changed since then ".  Says that recently when he walks to the mailbox and back, he is significantly short of breath.  States that this is new.  Names off activities that he used to be able to do-- with no shortness of breath / DOE.  Also discussed that his blood pressure is reading significantly high today.  Reports that in the past when he would be at work if he had a headache he would get the CNA at work to check his blood pressure and it would be high.  Says "then later the headache would be gone" /resolved and that he "would get his blood pressure rechecked and it would be okay." He  has no other known history of high blood pressure but also admits that he has never had a PCP until now.  Reports that he has been experiencing nausea and diarrhea for the past 3 days.  Has had no vomiting.  Says that about 3 days ago the vomiting was significant frequency.  Says that yesterday he did not eat and his stomach would feel upset but then nothing would really come out because he had not eaten.  States that he ate again last night and then did have diarrhea again this morning. Later, when it came time to get labs and I was wondering whether he was fasting, if he had had anything to eat today-- he says "A couple of pieces of cheese."  Reports that he quit smoking 20 years ago.  Reports that he has no other known medical history.   AT THAT OV:  Started Lisinopril 10 mg daily for blood pressure. Checked screening labs.  Was not fasting so did not check lipid panel. CBC was normal.  HIV was negative.  PSA was normal.  AST and ALT were elevated.  Hepatitis panel was added and was negative.  Right upper quadrant ultrasound was ordered.  Patient states that this is scheduled for tomorrow. Also placed referral to Cardiology.  Had visit with Dr. Acie Fredrickson 06/02/17.  I reviewed that office note.  Blood Pressure was still elevated at that visit.  Added HCTZ 25 mg daily and K-Dur 10 milliequivalent daily in addition to the lisinopril. Today patient reports that he also discussed diet and exercise a lot with Dr. Acie Fredrickson.  Pt states that he has cut out carbohydrates and is starting silver sneakers on January 1.  We also discussed the elevated LFTs. Patient reports that he was taking Tylenol every day because of his knee pain.  Sometimes was taking twice daily. He also reports that he was "drinking like a fish ".  Says that he was drinking about 10 beers every single day.  Says "that was his past time." Says that he has seen people who have stopped drinking cold [place] and knows that that will cause  DTs.  Therefore did not stop cold [place].  Has gradually weaned off of the alcohol.  Is down to drinking just 1 or 2 beers in the evening.  Plans to completely stop this but did not want to cause DTs.    06/11/2017: Reports that he is taking the lisinopril 10 mg daily.  Also is taking the HCTZ 25 mg daily and K-Dur 10 milliequivalent daily. He states that he had the stress test yesterday.  Is scheduled for the right upper quadrant ultrasound tomorrow.  Scheduled to see Dr. Acie Fredrickson back for follow-up visit February 4.  States that he is supposed to go have fasting labs done that day. Reports that he has already lost about 10 pounds and has decreased 1-2  inches in his waist. Discussed that my concern of possible sleep apnea.  He states that he knows that he does snore loudly but has not been told of any periods of sounding like he stops breathing.  Says that he sleeps for about 2 hours and then gets up to take the dogs out and then sleeps another 2 hours and then gets up again. So discussed that since he is having so many lifestyle changes and is having weight reduction will wait to discuss testing for sleep apnea. Says that Dr. Acie Fredrickson also discussed that he may not even have to have knee surgery if he loses weight and this has really been a motivating for him. He reports that he has been noticing these bumps and red areas on his cheeks of his skin on his face.  No other specific concerns to address today.    09/17/2017: Says symptoms started 10 days ago.  Says he has been at [facility], that is why he is just now coming in to be seen.  Just got out of [facility] 3 hours ago.  His wife accompanies him for OV today.  He says he tried to quit alcohol at home but got DTs so realized he had to go somewhere that could help him.   Reports that his throat has felt sore for 10 days. Says he has been "getting out the nastiest looking phlegm you've ever seen". Has chest congestion. No known  fevers, chills.   At that OV--prescribed meds for his respiratory infection, recommended that he schedule a follow-up visit for [place] to update regarding his general medical care. He is scheduling f/u OV for 30 minute slot in 1 -2 weeks--for routine f/u --- he has a lot going on, recent cardiac cath, etc.    09/24/2017: He presents for that follow-up visit today. Talks about having pain in multiple areas including his knees his hands his back etc.  Mentions rheumatoid arthritis and asks if he  should see a rheumatologist.  However also in that same breath, he references things that he knows he has put his body through and "being banged up".  Explained that rheumatoid arthritis is a different condition and that what he is referring to is osteoarthritis.  Discussed that for osteoarthritis there would not be a lot of additional treatment from a rheumatologist.  Then starts discussing his knees and says that he has appointment to see orthopedics on the 17th and says that he wants to do the injection series again if the orthopedic agrees with that.  Says that he is losing weight and is hoping that will help his knees.  He states that in regards to the alcohol that this Saturday will be day 30 of him being alcohol free.  Says that he and his wife are going to meetings every single day.  Says that his wife does not drink alcohol but that she is going with him everywhere he goes.  Says "he does not leave the house without her -- she is my body guard -- she makes sure I do not stop at a convenient store etc. ".  Says that for now he is going to the meetings every day and that he will be getting a sponsor soon.  Lifts his shirt and shows me large umbilical hernia and asks what he can do about that.  Says that he is concerned that it is going to cause problems one day.  He reports that he occasionally still has a cough.  Says that he will go all day without a cough and then all of a sudden will feel a tickle in his  throat and then starts coughing and cannot stop.   Says that since taking the antibiotic I gave him, that the phlegm color has changed but still has occasional episode of cough.  Today I reviewed Dr. Elmarie Shiley office note from 07/20/17.  Reviewed that patient had a Myoview study that was abnormal.  Cardiac catheterization on December 24 revealed mild to moderate nonobstructive CAD.  Distal branches are small and diffusely diseased.  Normal LV systolic function.  Today patient states "he pretty much told me I could just go back there on an as-needed basis ".  Today I reviewed that he had a lipid panel 06/12/17 that showed triglycerides 73, HDL 49, LDL 112. PSA was checked 05/2017 and was normal. Today I discussed that the only other cancer screening that is recommended is screening for colorectal cancer.  He does not want to proceed with a colonoscopy or any screening right now.  Says that prior to coming to see me recently he had not had any medical care in years and now all of a sudden has so much going on-- wants to put this on hold.  Today I also reviewed that at his initial visit with me I was concerned of possible sleep apnea.  He reports that his wife has told him that he snores but that when he was at [facility] they told him he did not snore there.  Also he is losing weight right now with diet and exercise.  Also, he reports that he does feel rested when he wakes up in the morning.  He wants to hold off on sleep study at this point.  No other concerns to address today.  Addendum Added 10/05/2017: Received note from [facility]. Date of encounter: 09/30/2017 Audiological Testing was performed.  They recommended certain hearing aids or reprogramming his current aids. He was  going to check into insurance coverage then f/u with them.   AT THAT OV ON 10/07/5359: Umbilical hernia----placed referral to general surgery Hypertension------- concerned that cough may be secondary to ACE inhibitor.     ---------------------------Discontinued lisinopril and replaced with losartan 50 mg daily.   ---------------------------Planned for him return in 2 weeks to recheck BP and BMET after med change.   ---------------------------Also with follow-up to see if cough resolved after med change.    10/08/2017: Today he reports that he did discontinue the lisinopril and did start the losartan 50 mg daily.   However, he reports that he continues to have the cough.  Says that he "seems to get the coughing spells at the most inconvenient times-- at times that he supposed to be quiet-- like when he is in church or sitting in [place] meeting."  He does have phlegm present.  Says that if he does blow his nose, that there is thick mucus there-- but mostly is not having much come out of his nose-- mostly is feeling drainage in his throat and chest. He has not heard yet regarding appointment with surgeon. He is glad that he has lost a few more pounds today even since his most recent visit. Says that he has stayed off of the alcohol.  Says that "his wife has backed off some".  Says that "this is a problem with me, not her", Says that he now has a sponsor and is continuing to go to the meetings every night.  Says "I am seeing too many positives to go back to ever drinking ".   "I realize I was living in such a haze ".  Also is feeling better and losing weight and knows that it is going to help improve his health in addition to improving other areas of his life.  He has no other specific concerns to address. AT THAT OV: --Prescribed Levaquin. And Hycodan. --Continued current BP meds. Checked lab to monitor since changed ACE Inh to ARB at [facility]   10/22/2017:  Today I reviewed phone notes that are documented since last visit. On 10/15/2017 patient called in stating that the losartan 50 mg was causing him to have dizziness.   Reported that the Levaquin and Hycodan had helped with his cough.  Was wondering if he could go back to  the lisinopril. I replied that --yes-- he could discontinue the losartan and return to the lisinopril.  Today he reports: Today he reports that when he was on the losartan he had some episodes of feeling lightheaded like he was going to pass out.  Says "it could have had to do with what I was doing though ".  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  Had to sit down and drink some water.  However, he states "that pill made me feel bad so I stopped it and went back to the lisinopril. I would rather have a cough." Today we reviewed all the other things that have been changes that have been occurring in his body in addition to the changes with the blood pressure medicines.   That he has lost almost 30 pounds in just 5 months.   He brings up his weight loss during the visit and is excited that he has had to buy new clothes because the other ones  are too big.   Asked what he had changed other than stopping the alcohol.   His response is "the all-you-can-eat [place] buffet ".   Says "now he is eating low to no carbs".   "Now he is eating small portions instead of eating until I feel full ".   Is pretty much cut out potato pasta rice.   Says but I do drink [place] coffee in the morning-- he says that there are 4 teaspoons of sugar and espresso.  Says that he is getting all that sugar at that point right in the morning but otherwise very little carbohydrates or sugar.  He reports that he did have visit with the surgeon and I noted that I did get note from them and they are going to proceed with repair to his umbilical hernia.  He states that the surgery scheduler has been calling him but they "are playing phone tag" and he will make sure to talk to them today.  Reports that even though he has had these couple episodes of lightheadedness when  working in the heat that he has continued to get good blood pressure readings at home.  Says that even this morning got reading of 137/70.  However, notes that when he did go to the surgeon's office they did mention getting 784 systolic reading.  Reports that since he took the Levaquin all of his phlegm has resolved and cough has resolved.  Also states that regarding his left knee-- he is going to start the viscous supplement injections-- for a set of 3-- and will start at the end of this month ".   Says "after he does the series of 3 to the left knee then going to do the right one ".  He is staying off of the alcohol.  Is continuing to go to the meetings etc.  No other concerns to address today.    11/02/2017:  A/P AT LOV 10/22/2017:  I rechecked blood pressure today myself and I am getting 110/68. Nurse check today was 99/60.  He reports that at the surgeon's visit this week was 696 systolic. At this point I have told him to stop the losartan and the lisinopril both. Lisinopril was most likely the cause of his cough so this needs to be stopped. At this point he is convinced that the losartan directly is causing his lightheadedness so we will keep him off of this medication.( even though lightheadedness secondary low BP in general/ secondary to combination of alcohol cessation, diet changes, weight loss, HCTZ) At this point we will have him just take the HCTZ for hypertension and we will have him return for follow-up in 1 week. They reviewed that his weight is down 28 pounds compared to his visit in December.  Discussed that with this weight loss makes sense that he is not requiring as much blood pressure medication and that his blood pressure is improving. Follow up visit in 1 week.  As well he is to bring blood pressure log from home BP readings to that visit to review.   Today pt reports that he did stop both the losartan and lisinopril as directed. He reports that he has been checking  blood pressure at home since then.  Systolic read 295 once, but at other times,  all other systolic readings in the 284X to 140s.  Systolic never less than 324.  Diastolic ranged from 40-10.  Today I reviewed the fact that we had discussed possible sleep apnea and need  for sleep study in the past.   Also reviewed that his recent preop evaluation screen showed concern for possible sleep apnea.   Again today he states that he wants to put that on hold.   States that he has too many other things going on regarding medical care right now.   Tomorrow has the surgery for his umbilical hernia repair.   Wednesday has injection to the left knee.   Also says that he "sleeps like a baby" as far as he can tell.  Today he reports that "the cough is back."   States that it is a dry, hacky cough.   States that once he starts coughing, he cannot stop.   States that he does not feel any drainage or phlegm in his throat and is not coughing up any kind of phlegm or drainage.  Is feeling no sore throat.   Says that it feels like something deep down in his throat at times and that is what starts the coughing to start. He is having no symptoms of classic heartburn.  Is not tasting acid like vomit in his mouth/throat.  A/P AT OV 11/02/2017: 1. Essential hypertension At this time we will keep him off of ACE inhibitor giving issues with cough. At this time we will keep him off ARB given that he was convinced that it was the losartan that was causing some of his symptoms in the past At this time will continue the HCTZ At this time we will add Norvasc 5 mg daily to take with the HCTZ. Have him return for follow-up visit in 2 weeks to recheck BP on these medications. - amLODipine (NORVASC) 5 MG tablet; Take 1 tablet (5 mg total) by mouth daily.  Dispense: 30 tablet; Refill: 11  2. Chronic cough He has been treated with Levaquin and cough has reoccurred. He is now off ACE inhibitor and cough is still  reoccurring. Will further evaluate cause of cough--- obtain chest x-ray.  Add Zyrtec daily to dry up any drainage as a trial of treatment for possible postnasal drip.  Add PPI to treat possible underlying GERD. If chest x-ray is negative and symptoms persist after taking Zyrtec and PPI--- then will consider referral to ENT for possible laryngoscopy. He will return for follow-up visit in 2 weeks and will follow-up this issue at that visit. - DG Chest 2 View; Future - Cetirizine HCl (ZYRTEC ALLERGY) 10 MG CAPS; Take one daily.  Dispense: 30 capsule; Refill: 5 - omeprazole (PRILOSEC) 20 MG capsule; Take 1 capsule (20 mg total) by mouth daily.  Dispense: 30 capsule; Refill: 3     11/23/2017: Today I reviewed that he has lost another 8 pounds since his last visit on 11/02/2017. Today he states that since he has lost his weight he is feeling so much better he has been "wanting to be with his wife more" and " I know I'm getting older, but things aren't working like they used to" Is wanting to try some medication to help with this. Also notes that he finished the series of 3 injections to the left knee.  Has gotten the first 2 on the right knee and is going for the third injection to the right knee on Wednesday. Also states that this Thursday will be 90 days of alcohol free. Says that he has made very strict diet changes along with the alcohol cessation and says this is why he is continuing to lose some much weight.  Says that for breakfast  he is now eating 1 egg white whereas before he was eating multiple eggs and bacon etc.  Also says that he has cut out all of the potatoes rice all carbs are pretty well cut out completely. Reviewed that his blood pressure is reading great today.  128/64. Asked about the cough.  States that the cough has resolved.  He states that actually he thinks that when they did the intubation for his hernia surgery that that cleared out what ever was there that was causing the  cough.  Says that he eats a lot of popcorn and really thinks that it might have been a popcorn kernel stuck in his throat because that is what it felt like-- and b/c the cough has completely resolved since the intubation.  Says that he had gotten the medications filled for the omeprazole and Zyrtec but never started them and that he has had no cough since the intubation from the surgery. Also reviewed that he had umbilical hernia surgery on 11/03/2017.  Shows me that result today which was good.   Notes that he is going to the beach this week (with his new body!and Viagra)!.   12/28/2017: Today he reports that he has continued to lose additional weight--- even since last visit. 11/23/17 he was 250 pounds.  Today is 243. He states that he is working out and exercising in addition to his diet changes.  Is going to [employer].  Goes there with his 65 year old grandson and keeps up with his grandson !!! Reports that they have been to [facility] and [place] recently.  Will be going back there again as well as [facility]. States that he does have a couple of issues to address. Has a rash on his right forearm.  Says that he has applied the medicine I had given him in the past (which was MetroGel) and that has not helped. Also states that he has tons of skin tags developing and wants to have them removed.  They are extremely irritating and getting hung up on his clothing. Also reports that he is noticing a lot of varicose veins and that they are painful. Reports he is going to have to have knee surgery but plans to wait until winter to have the surgery. No other updates or concerns to address today. He is taking blood pressure medications as directed.  Having no lightheadedness or other adverse effects.    Past Medical History:  Diagnosis Date  . Arthritis   . HTN (hypertension)      Home Meds: Outpatient Medications Prior to Visit  Medication Sig Dispense Refill  . amLODipine (NORVASC) 5  MG tablet Take 1 tablet (5 mg total) by mouth daily. 30 tablet 11  . aspirin EC 81 MG tablet Take 1 tablet (81 mg total) by mouth daily.    Marland Kitchen ibuprofen (ADVIL,MOTRIN) 200 MG tablet Take 400 mg by mouth 4 (four) times daily as needed for moderate pain.     . metroNIDAZOLE (METROGEL) 1 % gel Apply topically daily. (Patient taking differently: Apply 1 application topically daily as needed. ) 45 g 0  . sildenafil (VIAGRA) 100 MG tablet Take 1 tablet (100 mg total) by mouth as needed for erectile dysfunction. 3 tablet 11  . Cetirizine HCl (ZYRTEC ALLERGY) 10 MG CAPS Take one daily. 30 capsule 5  . hydrochlorothiazide (HYDRODIURIL) 25 MG tablet Take 1 tablet (25 mg total) by mouth daily. 90 tablet 3  . HYDROcodone-acetaminophen (NORCO/VICODIN) 5-325 MG tablet Take 1 tablet  by mouth every 6 (six) hours as needed for moderate pain. 20 tablet 0  . omeprazole (PRILOSEC) 20 MG capsule Take 1 capsule (20 mg total) by mouth daily. 30 capsule 3   No facility-administered medications prior to visit.     Allergies:  Allergies  Allergen Reactions  . Other     Opioids - "scratch my face off"     Social History   Socioeconomic History  . Marital status: Married    Spouse name: Not on file  . Number of children: Not on file  . Years of education: Not on file  . Highest education level: Not on file  Occupational History  . Not on file  Social Needs  . Financial resource strain: Not on file  . Food insecurity:    Worry: Not on file    Inability: Not on file  . Transportation needs:    Medical: Not on file    Non-medical: Not on file  Tobacco Use  . Smoking status: Never Smoker  . Smokeless tobacco: Never Used  Substance and Sexual Activity  . Alcohol use: No    Comment: 63 days sober from today 10/28/2017  . Drug use: No  . Sexual activity: Not on file  Lifestyle  . Physical activity:    Days per week: Not on file    Minutes per session: Not on file  . Stress: Not on file  Relationships   . Social connections:    Talks on phone: Not on file    Gets together: Not on file    Attends religious service: Not on file    Active member of club or organization: Not on file    Attends meetings of clubs or organizations: Not on file    Relationship status: Not on file  . Intimate partner violence:    Fear of current or ex partner: Not on file    Emotionally abused: Not on file    Physically abused: Not on file    Forced sexual activity: Not on file  Other Topics Concern  . Not on file  Social History Narrative  . Not on file    Family History  Problem Relation Age of Onset  . Dementia Mother   . Heart attack Father   . CAD Brother      Review of Systems:  See HPI for pertinent ROS. All other ROS negative.    Physical Exam: Blood pressure 138/62, pulse 67, temperature 97.8 F (36.6 C), temperature source Oral, resp. rate 20, height 5\' 6"  (1.676 m), weight 110.3 kg (243 lb 3.2 oz), SpO2 98 %., Body mass index is 39.25 kg/m. General: WM. Appears in no acute distress. Neck: Supple. No thyromegaly. No lymphadenopathy. No carotid bruits. Lungs: Clear bilaterally to auscultation without wheezes, rales, or rhonchi. Breathing is unlabored. Heart: RRR with S1 S2. No murmurs, rubs, or gallops. Abdomen: Soft, non-tender, non-distended with normoactive bowel sounds. No hepatomegaly. No rebound/guarding. No obvious abdominal masses. Musculoskeletal:  Strength and tone normal for age. Extremities/Skin: He has varicose veins on both lower legs. He reports that they are painful. Right Forearm: There is area of rash that is ~ 1inch diameter but irregular borders (not smooth round circle) Borders are raised.  He has many, multiple skin tags towards axilla bilaterally and neck-- irritated, erythematous. Neuro: Alert and oriented X 3. Moves all extremities spontaneously. Gait is normal. CNII-XII grossly in tact. Psych:  Responds to questions appropriately with a normal affect.  ASSESSMENT AND PLAN:  62 y.o. year old male with    Subclinical hypothyroidism 10/08/2017:  Lab 05/27/2017 showed elevated TSH but normal free T4.  We will recheck labs in 6 months.--Due to recheck 11/2017 --------------------------------------------11/23/2017:  --Due to recheck 11/2017-------------------------------------------------------------------------------------------------------------------------------------------------------- 12/28/2017------ I did not see this until after his OV today- --WILL RECHECK THESE LABS WHEN HE RETURNS FOR SKIN TAGS IN 2 WEEKS   Varicose veins of bilateral lower extremities with pain 12/28/2017--I ordered referral to Vein Specialist - Ambulatory referral to Vascular Surgery  Dermatitis 12/28/2017--Rash looks consistent with a fungal infection.  We will have him treat with Lotrisone.  We will follow this up when he returns in 2 weeks to remove skin tags to make sure this is improved. - clotrimazole-betamethasone (LOTRISONE) cream; Apply 1 application topically 2 (two) times daily.  Dispense: 30 g; Refill: 0   Essential hypertension 11/23/2017: Blood Pressure is now at goal/well-controlled.  Continue current medications.  Will have him return for follow-up in 1 month.  Given that he is continuing to have significant weight loss will have him return for follow-up.  May eventually be able to decrease BP meds if continues with additional weight loss. 12/28/2017-- Blood pressure is at goal/controlled.  10/28/2017 CMET was normal.  Can wait to recheck this lab.  Chronic cough 11/23/2017: Today he reports that the cough has completely resolved since the intubation with his hernia surgery. We will follow-up again at his next visit in 1 month to make sure that this cough has continued to be resolved and has not reoccurred. If cough reoccurs then will need to take the PPI and Zyrtec. If cough persists would have low threshold for referral to ENT for laryngoscopy or  GI for EGD especially given his history of alcohol abuse.   Severe obesity (BMI >= 40) ([place]) 11/23/2017: Continues to have significant weight loss.  Has stopped alcohol and also has made significant diet changes. 05/27/2017 weight was 286 09/24/2017 weight 266 10/22/2017 weight 258  11/23/2017 weight 250 Have him return for follow-up visit in 1 month given that he is having continued significant weight loss to monitor blood pressure etc. 12/28/2017--weight down to 243 today.  Blood pressure stable.  Alcohol abuse, in remission 11/23/2017: Today he reports that this Thursday will be 90 days of alcohol free.  He is continuing AA. 12/28/2017--Reports that he continues to be alcohol free.  Umbilical hernia without obstruction and without gangrene 11/23/2017: He had surgery for your umbilical hernia repair on 11/03/2017 with good result.   Erectile dysfunction, unspecified erectile dysfunction type 11/23/2017: We will give Viagra to use as directed.  He is to try taking just a half a tablet.  We will follow-up this issue at follow-up visit 1 month.  - sildenafil (VIAGRA) 100 MG tablet; Take 1 tablet (100 mg total) by mouth as needed for erectile dysfunction.  Dispense: 3 tablet; Refill: 11      Rosacea 09/24/2017:   At visit 06/11/2017 I was not aware of his significant alcohol use.  This was most likely the underlying cause of his rosacea.  Alcohol has now been discontinued.  At that visit I did prescribe MetroGel and doxycycline for rosacea. 10/08/2017:  Cont alcohol abstinence.  Continue the Flagyl and doxycycline if needed.   Elevated liver function tests 06/11/2017: Hepatitis panel was negative.  He reports that he was taking Tylenol 1-2 times every single day and was drinking about 10 beers per day.  Is totally stop the Tylenol and has been weaning  off of the ----------alcohol.  Is scheduled for right upper quadrant ultrasound tomorrow. ---------Suspect that the elevated LFTs were secondary  to the alcohol use so we will recheck these in the future once he is off alcohol.    Possible OSA 05/27/2017--Will discuss at future OV. He has very large neck, HTN, Obesity---- high risk for OSA---will discuss at future OV 06/11/2017: He is being extremely compliant with lifestyle changes and has made lots of changes to his diet and is starting to exercise. 09/24/2017: See HPI for details, update regarding this.  Will hold off on sleep study at this time. 11/02/2017---See HPI for details. Discussed that PreOp Eval also showed + screen, concern for possible osa. See HPI for details.   Plan follow-up visit with me in 2 weeks for Skin Tag Removal and check TSH, Free T4 F/U sooner if needed.   Signed, [address] [place], [place], [facility] 12/28/2017 9:04 AM

## 2018-01-06 ENCOUNTER — Ambulatory Visit (INDEPENDENT_AMBULATORY_CARE_PROVIDER_SITE_OTHER): Payer: Medicare Other | Admitting: Physician Assistant

## 2018-01-06 ENCOUNTER — Encounter: Payer: Self-pay | Admitting: Physician Assistant

## 2018-01-06 VITALS — BP 130/68 | HR 68 | Temp 98.0°F | Resp 16 | Ht 66.0 in | Wt 242.0 lb

## 2018-01-06 DIAGNOSIS — L918 Other hypertrophic disorders of the skin: Secondary | ICD-10-CM

## 2018-01-06 NOTE — Progress Notes (Signed)
    Patient ID: Samuel [name] MRN: [id number], DOB: 27-Oct-1955, 62 y.o. Date of Encounter: 01/06/2018, 8:50 AM    Chief Complaint: Irritated skin tags    HPI: 62 y.o. year old male presents with above.   At recent OV with me, he reported having lots of very irritating skin tags. These are on his upper chest especially towards the axilla bilaterally. Wants to have these removed.  Presents to have these removed today.  No other concerns to address today.  Home Meds:   Outpatient Medications Prior to Visit  Medication Sig Dispense Refill  . amLODipine (NORVASC) 5 MG tablet Take 1 tablet (5 mg total) by mouth daily. 30 tablet 11  . aspirin EC 81 MG tablet Take 1 tablet (81 mg total) by mouth daily.    . clotrimazole-betamethasone (LOTRISONE) cream Apply 1 application topically 2 (two) times daily. 30 g 0  . hydrochlorothiazide (HYDRODIURIL) 25 MG tablet Take 1 tablet (25 mg total) by mouth daily. 90 tablet 3  . ibuprofen (ADVIL,MOTRIN) 200 MG tablet Take 400 mg by mouth 4 (four) times daily as needed for moderate pain.     . metroNIDAZOLE (METROGEL) 1 % gel Apply topically daily. (Patient taking differently: Apply 1 application topically daily as needed. ) 45 g 0  . sildenafil (VIAGRA) 100 MG tablet Take 1 tablet (100 mg total) by mouth as needed for erectile dysfunction. 3 tablet 11   No facility-administered medications prior to visit.     Allergies:  Allergies  Allergen Reactions  . Other     Opioids - "scratch my face off"       Review of Systems: See HPI for pertinent ROS. All other ROS negative.    Physical Exam: Blood pressure 130/68, pulse 68, temperature 98 F (36.7 C), temperature source Oral, resp. rate 16, height 5\' 6"  (1.676 m), weight 109.8 kg (242 lb), SpO2 99 %., There is no height or weight on file to calculate BMI. General:  WM. Appears in no acute distress. Neck: Supple. No thyromegaly. No lymphadenopathy. Lungs: Clear bilaterally to auscultation without  wheezes, rales, or rhonchi. Breathing is unlabored. Heart: Regular rhythm. No murmurs, rubs, or gallops. Msk:  Strength and tone normal for age. Skin: Large, irritated skin tags on upper chest, near axilla bilaterally.  Neuro: Alert and oriented X 3. Moves all extremities spontaneously. Gait is normal. CNII-XII grossly in tact. Psych:  Responds to questions appropriately with a normal affect.     ASSESSMENT AND PLAN:  62 y.o. year old male with   1. Cutaneous skin tags  5 skin tags were removed from left upper chest/ axilla area.  3 skin tags were removed from right upper chest / axilla area. Each site: Topical Anaesthetic was sprayed on each lesion. Each lesion was excised using scissors. Bleeding was controlled with use of Nitric Oxide Stick.  He tolerated the procedure well with no complications.   Signed, [address] [place], [place], [facility] 01/06/2018 8:50 AM

## 2018-01-25 ENCOUNTER — Emergency Department (HOSPITAL_COMMUNITY)
Admission: EM | Admit: 2018-01-25 | Discharge: 2018-01-25 | Disposition: A | Discharge status: Home / Self Care | Payer: Medicare Other | Attending: Emergency Medicine | Admitting: Emergency Medicine

## 2018-01-25 ENCOUNTER — Encounter (HOSPITAL_COMMUNITY): Payer: Self-pay | Admitting: Emergency Medicine

## 2018-01-25 ENCOUNTER — Other Ambulatory Visit: Payer: Self-pay

## 2018-01-25 ENCOUNTER — Ambulatory Visit (HOSPITAL_COMMUNITY): Admission: RE | Admit: 2018-01-25 | Payer: Medicare Other | Source: Ambulatory Visit

## 2018-01-25 ENCOUNTER — Emergency Department (HOSPITAL_COMMUNITY): Payer: Medicare Other

## 2018-01-25 DIAGNOSIS — Y9389 Activity, other specified: Secondary | ICD-10-CM | POA: Insufficient documentation

## 2018-01-25 DIAGNOSIS — Z7982 Long term (current) use of aspirin: Secondary | ICD-10-CM | POA: Insufficient documentation

## 2018-01-25 DIAGNOSIS — S1086XA Insect bite of other specified part of neck, initial encounter: Secondary | ICD-10-CM

## 2018-01-25 DIAGNOSIS — Z79899 Other long term (current) drug therapy: Secondary | ICD-10-CM | POA: Diagnosis not present

## 2018-01-25 DIAGNOSIS — Y9289 Other specified places as the place of occurrence of the external cause: Secondary | ICD-10-CM | POA: Insufficient documentation

## 2018-01-25 DIAGNOSIS — R55 Syncope and collapse: Secondary | ICD-10-CM

## 2018-01-25 DIAGNOSIS — W57XXXA Bitten or stung by nonvenomous insect and other nonvenomous arthropods, initial encounter: Secondary | ICD-10-CM | POA: Diagnosis not present

## 2018-01-25 DIAGNOSIS — I951 Orthostatic hypotension: Secondary | ICD-10-CM | POA: Diagnosis not present

## 2018-01-25 DIAGNOSIS — S1096XA Insect bite of unspecified part of neck, initial encounter: Secondary | ICD-10-CM | POA: Diagnosis not present

## 2018-01-25 DIAGNOSIS — I10 Essential (primary) hypertension: Secondary | ICD-10-CM | POA: Diagnosis not present

## 2018-01-25 DIAGNOSIS — S0086XA Insect bite (nonvenomous) of other part of head, initial encounter: Secondary | ICD-10-CM | POA: Insufficient documentation

## 2018-01-25 DIAGNOSIS — S50861A Insect bite (nonvenomous) of right forearm, initial encounter: Secondary | ICD-10-CM | POA: Insufficient documentation

## 2018-01-25 DIAGNOSIS — Y999 Unspecified external cause status: Secondary | ICD-10-CM | POA: Diagnosis not present

## 2018-01-25 LAB — URINALYSIS, ROUTINE W REFLEX MICROSCOPIC
Bilirubin Urine: NEGATIVE
Glucose, UA: NEGATIVE mg/dL
Hgb urine dipstick: NEGATIVE
Ketones, ur: NEGATIVE mg/dL
Leukocytes, UA: NEGATIVE
Nitrite: NEGATIVE
Protein, ur: NEGATIVE mg/dL
Specific Gravity, Urine: 1.014 (ref 1.005–1.030)
pH: 6 (ref 5.0–8.0)

## 2018-01-25 LAB — CBC
HCT: 40.1 % (ref 39.0–52.0)
Hemoglobin: 13.5 g/dL (ref 13.0–17.0)
MCH: 29.9 pg (ref 26.0–34.0)
MCHC: 33.7 g/dL (ref 30.0–36.0)
MCV: 88.9 fL (ref 78.0–100.0)
Platelets: 215 10*3/uL (ref 150–400)
RBC: 4.51 MIL/uL (ref 4.22–5.81)
RDW: 12.7 % (ref 11.5–15.5)
WBC: 16.1 10*3/uL — ABNORMAL HIGH (ref 4.0–10.5)

## 2018-01-25 LAB — BASIC METABOLIC PANEL
Anion gap: 11 (ref 5–15)
BUN: 13 mg/dL (ref 8–23)
CO2: 28 mmol/L (ref 22–32)
Calcium: 9.5 mg/dL (ref 8.9–10.3)
Chloride: 96 mmol/L — ABNORMAL LOW (ref 98–111)
Creatinine, Ser: 1.15 mg/dL (ref 0.61–1.24)
GFR calc Af Amer: >60 mL/min (ref >60)
GFR calc non Af Amer: >60 mL/min (ref >60)
Glucose, Bld: 103 mg/dL — ABNORMAL HIGH (ref 70–99)
Potassium: 3.2 mmol/L — ABNORMAL LOW (ref 3.5–5.1)
Sodium: 135 mmol/L (ref 135–145)

## 2018-01-25 LAB — I-STAT TROPONIN, ED: Troponin i, poc: 0 ng/mL (ref 0.00–0.08)

## 2018-01-25 LAB — CBG MONITORING, ED: Glucose-Capillary: 86 mg/dL (ref 70–99)

## 2018-01-25 NOTE — ED Notes (Signed)
Pt reports syncopal episode x 3 today while mowing the lawn and while "running from wasps."  There is a large erythematous area on his R anterior FA.  He reports hitting his head on the ground.  He denies cp, SOB or dizziness.  He is A&Ox 4.  In NAD.  He reports feeling hungry.  Family at bedside.

## 2018-01-25 NOTE — ED Triage Notes (Addendum)
Pt reports getting stung by a yellow jacket to left cheek and rt forearm. 3 inch welt noted to forearm, warm to touch, red. Pt started having ear ringing then had a 3 syncopal episodes. Reports falling forward and backwards. Denies any other injuries, unknown if hit head, pt states glasses went flying off his face. Denies being on blood thinners. Never had reaction to sting before like this. Denies sob. Denies pain. Pt recently had heart cath.

## 2018-01-25 NOTE — ED Provider Notes (Signed)
MSE was initiated and I personally evaluated the patient and placed orders (if any) at  4:01 PM on January 25, 2018.  The patient appears stable so that the remainder of the MSE may be completed by another provider.  Patient placed in Quick Look pathway, seen and evaluated   Chief Complaint: LOC, insect bites   HPI:   Patient is a 62 year old male with a history of alcohol use disorder, currently sober, history of abnormal stress test resulting in multiple cardiac catheterizations with nonobstructive CAD presenting for insect bites to the left cheek and the right forearm and 3 subsequent syncopal episodes.  Occurred at 1pm today. Patient reports that when he was bit by a yellow jacket, which he has been bit by before, he began swatting at it, went back to work, and then a few minutes later syncopized, hitting his head.  Patient reports he was in a low field, began walking back to his house, and syncopized again.  Patient reports a third syncopal episode before making it back to the house.  Patient denies any chest pain, shortness of breath, wheezing, tachycardia, hives, abdominal cramping, or nausea, vomiting, or diarrhea when this all occurred.  Patient is completely asymptomatic at present.  ROS: See HPI (one)  Physical Exam:   Gen: No distress  Neuro: Awake and Alert  Skin: Warm    Focused Exam: Patient exhibits wheel to the left cheek as well as to the right lateral forearm.  No other urticarial lesions.  Heart is regular rate and rhythm.  Lungs clear to auscultation.  DP pulses are equal bilaterally.  Patient is 3 hours out from the initial incident.  No signs or symptoms of anaphylactic reaction at present.    Initiation of care has begun. The patient has been counseled on the process, plan, and necessity for staying for the completion/evaluation, and the remainder of the medical screening examination    Tamala Julian 01/25/18 1609    Pattricia Boss, MD 01/25/18 [phone]

## 2018-01-25 NOTE — ED Notes (Signed)
Patient transported to X-ray 
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
## 2018-01-25 NOTE — ED Notes (Signed)
Pt ambulatory in hallway with this NT with no assist and steady gait. Pt reports no dizziness or shortness of breath, states he is feeling well and is resting in bed at this time.

## 2018-01-25 NOTE — ED Notes (Signed)
Pt declined wheelchair to go out to the lobby

## 2018-01-27 ENCOUNTER — Encounter: Payer: Self-pay | Admitting: Physician Assistant

## 2018-01-27 ENCOUNTER — Ambulatory Visit (INDEPENDENT_AMBULATORY_CARE_PROVIDER_SITE_OTHER): Payer: Medicare Other | Admitting: Physician Assistant

## 2018-01-27 VITALS — BP 136/72 | HR 71 | Temp 97.7°F | Resp 18 | Ht 66.0 in | Wt 236.2 lb

## 2018-01-27 DIAGNOSIS — I10 Essential (primary) hypertension: Secondary | ICD-10-CM | POA: Diagnosis not present

## 2018-01-27 DIAGNOSIS — G8929 Other chronic pain: Secondary | ICD-10-CM

## 2018-01-27 DIAGNOSIS — M25561 Pain in right knee: Secondary | ICD-10-CM

## 2018-01-27 DIAGNOSIS — Z0289 Encounter for other administrative examinations: Secondary | ICD-10-CM

## 2018-01-27 DIAGNOSIS — M25562 Pain in left knee: Secondary | ICD-10-CM

## 2018-01-27 NOTE — Progress Notes (Signed)
Patient ID: Samuel [name] MRN: [id number], DOB: June 10, 1956, 62 y.o. Date of Encounter: 01/27/2018, 8:33 AM    Chief Complaint:  Chief Complaint  Patient presents with  . discuss disability paperwork     HPI: 62 y.o. year old male presents with above.   He brought in a form for me to complete. He states that this has to do with deferment of his student loans secondary to disability/ inability to work/ inability to make money to pay off the student loans. 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  Says that he was working that job until he was injured at work.  States that after that injury, there were issues regarding whether Worker's Comp. versus whether his knee problems were secondary to arthritis etc.  Reports that the settlement ended up on [employer] and that he started receiving Social Security disability after that.  States that by the time everything was processed he had been out of work for extended amount of time, out of job. States that the incidents that occurred----states that he was working with a patient who had been involuntarily commitment (IVC).  States that the patient was trying to leave/ elope.  Mr. Bevill then tried to stop the patient from leaving.  Mr. Mcnab states that both of them ended up on the floor and that Mr. Stocks injured his knee.  Mr. Miller first visit with me was 05/27/2017.  That visit was to establish care. Therefore I was not involved in his care at the time of the injury that he is referencing.  In my limited time with him, however---- for all of his visits with me--- he has been able to walk in and out of our office without difficulty.  He has been able to sit in the chair in our office and sit on the exam table without any difficulty.  Also he had ER visit 01/25/2018.  That documents that he had been to the gym earlier that day then was in a field burning brush. Obviously no limitations and sitting standing walking  lifting.  Therefore I cannot complete this form.  Will return to him un- completed.    Home Meds:   Outpatient Medications Prior to Visit  Medication Sig Dispense Refill  . amLODipine (NORVASC) 5 MG tablet Take 1 tablet (5 mg total) by mouth daily. 30 tablet 11  . aspirin EC 81 MG tablet Take 1 tablet (81 mg total) by mouth daily.    . clotrimazole-betamethasone (LOTRISONE) cream Apply 1 application topically 2 (two) times daily. 30 g 0  . ibuprofen (ADVIL,MOTRIN) 200 MG tablet Take 400 mg by mouth 4 (four) times daily as needed for moderate pain.     . metroNIDAZOLE (METROGEL) 1 % gel Apply topically daily. 45 g 0  . sildenafil (VIAGRA) 100 MG tablet Take 1 tablet (100 mg total) by mouth as needed for erectile dysfunction. 3 tablet 11  . hydrochlorothiazide (HYDRODIURIL) 25 MG tablet Take 1 tablet (25 mg total) by mouth daily. 90 tablet 3   No facility-administered medications prior to visit.     Allergies:  Allergies  Allergen Reactions  . Other     Opioids - "scratch my face off"       Review of Systems: See HPI for pertinent ROS. All other ROS negative.    Physical Exam: Blood pressure 136/72, pulse 71, temperature 97.7 F (36.5 C), temperature source Oral, resp. rate 18, height 5\' 6"  (1.676 m),  weight 107.1 kg, SpO2 99 %., Body mass index is 38.12 kg/m. General:  WM. Appears in no acute distress. Neck: Supple. No thyromegaly. No lymphadenopathy. Lungs: Clear bilaterally to auscultation without wheezes, rales, or rhonchi. Breathing is unlabored. Heart: Regular rhythm. No murmurs, rubs, or gallops. Msk:  Strength and tone normal for age. Extremities/Skin: Warm and dry.  Neuro: Alert and oriented X 3. Moves all extremities spontaneously. Gait is normal. CNII-XII grossly in tact. Psych:  Responds to questions appropriately with a normal affect.     ASSESSMENT AND PLAN:  62 y.o. year old male with   1. Encounter for completion of form with patient I am not completing  the form.  I do not think that this is appropriate to complete. Form has a section to complete regarding limitations.  Directions under that section say "explain how the condition prevents the applicant from engaging in [place] substantial gainful activity in [place] of work.    The specific areas to complete include:  "limitations on sitting, standing, walking, lifting" "Limitations on activities of daily living " "Residual functionality " "Social/behavioral limitations if any " "Global assessment function score for psychiatric conditions "  2. Essential hypertension Stable  3. Chronic pain of left knee Stable  4. Chronic pain of right knee Stable   Signed, [address] [place], [place], [facility] 01/27/2018 8:33 AM

## 2018-02-02 ENCOUNTER — Telehealth: Payer: Self-pay

## 2018-02-02 NOTE — Telephone Encounter (Signed)
Patient had an appointment on 01/27/2018 and at that appointment he had forms for disability and student loan forgiveness that he wanted filled out. Olean Ree stated she would not fill the forms out. Call placed to patient to inform [facility]

## 2018-02-08 NOTE — Telephone Encounter (Signed)
Call placed to patient and explained PCP did not feel it was appropriate to fill out the disability forms. Patient was upset with the decision that the PCP made and states he will come by and pick the form up. The form is at front desk for patient to pick up he is aware.

## 2018-02-10 ENCOUNTER — Other Ambulatory Visit: Payer: Self-pay

## 2018-02-10 DIAGNOSIS — I83813 Varicose veins of bilateral lower extremities with pain: Secondary | ICD-10-CM

## 2018-02-18 ENCOUNTER — Telehealth: Payer: Self-pay

## 2018-02-18 NOTE — Telephone Encounter (Signed)
No

## 2018-02-18 NOTE — Telephone Encounter (Signed)
Patient called and left a message asking if PCP  would reconsider filling out his disability form and have a MD look it over as well. Previously explained to patient that PCP did not feel it was appropriate to fill the form out at that time patient was not happy that PCP, and now has called back with the same request.  Please advise

## 2018-02-18 NOTE — Telephone Encounter (Signed)
Patient states he will find another provider and get someone else to fill the form out. Patient also states he will receive the original forms that were filled out

## 2018-03-01 ENCOUNTER — Encounter: Payer: BLUE CROSS/BLUE SHIELD | Admitting: Surgery

## 2018-03-01 ENCOUNTER — Encounter (HOSPITAL_COMMUNITY): Payer: BLUE CROSS/BLUE SHIELD

## 2018-03-05 ENCOUNTER — Encounter: Payer: Self-pay | Admitting: Family Medicine

## 2018-03-05 ENCOUNTER — Other Ambulatory Visit: Payer: Self-pay

## 2018-03-05 ENCOUNTER — Ambulatory Visit (INDEPENDENT_AMBULATORY_CARE_PROVIDER_SITE_OTHER): Payer: Medicare Other | Admitting: Family Medicine

## 2018-03-05 VITALS — BP 122/76 | HR 76 | Temp 98.4°F | Ht 66.0 in | Wt 215.6 lb

## 2018-03-05 DIAGNOSIS — Z1211 Encounter for screening for malignant neoplasm of colon: Secondary | ICD-10-CM | POA: Diagnosis not present

## 2018-03-05 DIAGNOSIS — Z23 Encounter for immunization: Secondary | ICD-10-CM

## 2018-03-05 DIAGNOSIS — I2510 Atherosclerotic heart disease of native coronary artery without angina pectoris: Secondary | ICD-10-CM | POA: Diagnosis not present

## 2018-03-05 DIAGNOSIS — M170 Bilateral primary osteoarthritis of knee: Secondary | ICD-10-CM | POA: Diagnosis not present

## 2018-03-05 DIAGNOSIS — Z1212 Encounter for screening for malignant neoplasm of rectum: Secondary | ICD-10-CM | POA: Diagnosis not present

## 2018-03-05 DIAGNOSIS — L739 Follicular disorder, unspecified: Secondary | ICD-10-CM

## 2018-03-05 DIAGNOSIS — E669 Obesity, unspecified: Secondary | ICD-10-CM | POA: Diagnosis not present

## 2018-03-05 DIAGNOSIS — I10 Essential (primary) hypertension: Secondary | ICD-10-CM | POA: Diagnosis not present

## 2018-03-05 DIAGNOSIS — F1011 Alcohol abuse, in remission: Secondary | ICD-10-CM

## 2018-03-05 DIAGNOSIS — Z87891 Personal history of nicotine dependence: Secondary | ICD-10-CM | POA: Diagnosis not present

## 2018-03-05 HISTORY — DX: Personal history of nicotine dependence: Z87.891

## 2018-03-05 MED ORDER — DOXYCYCLINE HYCLATE 100 MG PO TABS: 100.0000 mg | ORAL_TABLET | Freq: Two times a day (BID) | ORAL | 0 refills | Status: AC

## 2018-03-05 NOTE — Progress Notes (Signed)
Subjective  CC:  Chief Complaint  Patient presents with  . Establish Care    Transfer Care from [facility] flu shot today     HPI: Samuel [name] is a 62 y.o. male who presents to [place] [facility] today to establish care with me as a new patient.  I have reviewed extensive records from the last 6 years of care via Care everywhere; briefly, 62 year old male who is been sober since August 28, 2017 after a long history of alcoholism.  Doing well.  He is on chronic disability for apparent bilateral knee osteoarthritis and possible Workmen's Comp. related injury.  He sees orthopedics and manages his knee pain with Advil daily.  He has a history of hypertension that is well controlled.  He has nonobstructive coronary artery disease by cardiac catheterization 2018.  His last physical was last month.  He has no new concerns. He has the following concerns or needs:  Health maintenance needs include colon cancer screening.  He defers colonoscopy at this time due to working on sobriety and managing knee pain.  No family history of colon cancer.  Average risk patient.  History of alcoholic hepatitis.  No other known complications from drinking.  His mother is an alcoholic and does have alcoholic related dementia.  He suffers from chronic rash on bilateral forearms treated with metronidazole gel first and then Lotrisone cream.  Neither have been helpful.  It is itching.  He would like to see dermatologist.  Former heavy smoker, denies COPD.  No shortness of breath or cough.  He has not had lung cancer screening.  Assessment  1. Essential hypertension   2. Nonobstructive atherosclerosis of coronary artery   3. Obesity (BMI 30-39.9)   4. Chronic folliculitis   5. Alcohol abuse, in remission   6. Screening for colorectal cancer   7. Primary osteoarthritis of both knees   8. Need for influenza vaccination   9. Former heavy cigarette smoker (20-39 per day)      Plan     Hypertension is well controlled.  Normal renal function electro lites per recent labs.  Continue current medications.  Continue weight loss and modifying risk factors to prevent further coronary artery disease.  Consider aspirin daily  Cologuard for colorectal cancer screening.  Follow-up with Ortho for management of knee pain and for any disability related needs.  Chronic folliculitis: Trial of doxycycline for the next week and then follow-up with dermatology if needed.  Referral placed  Flu vaccine given today.  Follow up: 6 months for complete physical with lab work Orders Placed This Encounter  Procedures  . Flu Vaccine QUAD 36+ mos IM  . Cologuard  . Ambulatory referral to Dermatology   Meds ordered this encounter  Medications  . doxycycline (VIBRA-TABS) 100 MG tablet    Sig: Take 1 tablet (100 mg total) by mouth 2 (two) times daily for 7 days.    Dispense:  14 tablet    Refill:  0     Depression screen [facility] 2/9 06/11/2017 05/27/2017  Decreased Interest 0 0  Down, Depressed, Hopeless 0 0  PHQ - 2 Score 0 0    We updated and reviewed the patient's past history in detail and it is documented below.  Patient Active Problem List   Diagnosis Date Noted  . Nonobstructive atherosclerosis of coronary artery 03/05/2018    Cardiac cath 05/2107   . Obesity (BMI 30-39.9) 03/05/2018  . Former heavy cigarette smoker (20-39 per  day) 03/05/2018    Quit 2000; 60 pak year history   . Varicose veins of bilateral lower extremities with pain 12/28/2017  . Umbilical hernia 78/67/6720  . Alcohol abuse, in remission 09/17/2017  . Osteoarthritis of knee 08/10/2017  . Rosacea 06/11/2017  . Subclinical hypothyroidism 06/05/2017  . Essential hypertension 05/27/2017  . Bilateral hearing loss 05/04/2014    Last Assessment & Plan:  - no TM perforation on exam - medically cleared for hearing aids    Health Maintenance  Topic Date Due  . COLONOSCOPY  03/08/2004  . INFLUENZA VACCINE   01/14/2018  . TETANUS/TDAP  06/16/2026  . Hepatitis C Screening  Completed  . HIV Screening  Completed   Immunization History  Administered Date(s) Administered  . Influenza,inj,Quad PF,6+ Mos 03/17/2014, 06/02/2017  . Influenza,inj,quad, With Preservative 05/16/2017  . Zoster Recombinat (Shingrix) 09/01/2016, 06/02/2017   Current Meds  Medication Sig  . amLODipine (NORVASC) 5 MG tablet Take 1 tablet (5 mg total) by mouth daily.  . hydrochlorothiazide (HYDRODIURIL) 25 MG tablet Take 1 tablet (25 mg total) by mouth daily.    Allergies: Patient is allergic to other. Past Medical History Patient  has a past medical history of Arthritis, Former heavy cigarette smoker (20-39 per day) (03/05/2018), and HTN (hypertension). Past Surgical History Patient  has a past surgical history that includes LEFT HEART CATH AND CORONARY ANGIOGRAPHY (N/A, 06/15/2017); Tonsillectomy; and Umbilical hernia repair (N/A, 11/03/2017). Family History: Patient family history includes Alcohol abuse in his mother; CAD in his brother; COPD in his father; Dementia in his mother; Healthy in his daughter, son, and son; Heart attack in his father; Heart disease in his father. Social History:  Patient  reports that he quit smoking about 19 years ago. His smoking use included cigarettes. He has a 60.00 pack-year smoking history. He has never used smokeless tobacco. He reports that he does not drink alcohol or use drugs.  Review of Systems: Constitutional: negative for fever or malaise Ophthalmic: negative for photophobia, double vision or loss of vision Cardiovascular: negative for chest pain, dyspnea on exertion, or new LE swelling Respiratory: negative for SOB or persistent cough Gastrointestinal: negative for abdominal pain, change in bowel habits or melena Genitourinary: negative for dysuria or gross hematuria Musculoskeletal: negative for new gait disturbance or muscular weakness, positive for chronic bilateral knee  pain Integumentary: negative for new or persistent rashes Neurological: negative for TIA or stroke symptoms Psychiatric: negative for SI or delusions Allergic/Immunologic: negative for hives  Patient Care Team    Relationship Specialty Notifications Start End  Leamon Arnt, MD PCP - General Family Medicine  03/05/18   Nahser, Wonda Cheng, MD Consulting Physician Cardiology  03/05/18   Paralee Cancel, MD Consulting Physician Orthopedic Surgery  03/05/18     Objective  Vitals: BP 122/76   Pulse 76   Temp 98.4 F (36.9 C)   Ht 5\' 6"  (1.676 m)   Wt 215 lb 9.6 oz (97.8 kg)   SpO2 98%   BMI 34.80 kg/m  General:  Well developed, well nourished, no acute distress  Psych:  Alert and oriented,normal mood and affect HEENT:  Normocephalic, atraumatic, non-icteric sclera, PERRL, oropharynx is without mass or exudate, supple neck without adenopathy, mass or thyromegaly Cardiovascular:  RRR without gallop, rub +2 systolic murmur, nondisplaced PMI Respiratory:  Good breath sounds bilaterally, CTAB with normal respiratory effort Skin:  Warm bilateral forearms with diffuse macules that are raised. Neurologic:    Mental status is normal. Gross motor and  sensory exams are normal. Normal gait normal cerebellar testing, no tremor, negative Romberg No visits with results within 1 Day(s) from this visit.  Latest known visit with results is:  Admission on 01/25/2018, Discharged on 01/25/2018  Component Date Value Ref Range Status  . Sodium 01/25/2018 135  135 - 145 mmol/L Final  . Potassium 01/25/2018 3.2* 3.5 - 5.1 mmol/L Final  . Chloride 01/25/2018 96* 98 - 111 mmol/L Final  . CO2 01/25/2018 28  22 - 32 mmol/L Final  . Glucose, Bld 01/25/2018 103* 70 - 99 mg/dL Final  . BUN 01/25/2018 13  8 - 23 mg/dL Final  . Creatinine, Ser 01/25/2018 1.15  0.61 - 1.24 mg/dL Final  . Calcium 01/25/2018 9.5  8.9 - 10.3 mg/dL Final  . GFR calc non Af Amer 01/25/2018 >60  >60 mL/min Final  . GFR calc Af Amer  01/25/2018 >60  >60 mL/min Final  . Anion gap 01/25/2018 11  5 - 15 Final  . WBC 01/25/2018 16.1* 4.0 - 10.5 K/uL Final  . RBC 01/25/2018 4.51  4.22 - 5.81 MIL/uL Final  . Hemoglobin 01/25/2018 13.5  13.0 - 17.0 g/dL Final  . HCT 01/25/2018 40.1  39.0 - 52.0 % Final  . MCV 01/25/2018 88.9  78.0 - 100.0 fL Final  . MCH 01/25/2018 29.9  26.0 - 34.0 pg Final  . MCHC 01/25/2018 33.7  30.0 - 36.0 g/dL Final  . RDW 01/25/2018 12.7  11.5 - 15.5 % Final  . Platelets 01/25/2018 215  150 - 400 K/uL Final  . Color, Urine 01/25/2018 YELLOW  YELLOW Final  . APPearance 01/25/2018 CLEAR  CLEAR Final  . Specific Gravity, Urine 01/25/2018 1.014  1.005 - 1.030 Final  . pH 01/25/2018 6.0  5.0 - 8.0 Final  . Glucose, UA 01/25/2018 NEGATIVE  NEGATIVE mg/dL Final  . Hgb urine dipstick 01/25/2018 NEGATIVE  NEGATIVE Final  . Bilirubin Urine 01/25/2018 NEGATIVE  NEGATIVE Final  . Ketones, ur 01/25/2018 NEGATIVE  NEGATIVE mg/dL Final  . Protein, ur 01/25/2018 NEGATIVE  NEGATIVE mg/dL Final  . Nitrite 01/25/2018 NEGATIVE  NEGATIVE Final  . Leukocytes, UA 01/25/2018 NEGATIVE  NEGATIVE Final  . Glucose-Capillary 01/25/2018 86  70 - 99 mg/dL Final  . Troponin i, poc 01/25/2018 0.00  0.00 - 0.08 ng/mL Final  . Comment 3 01/25/2018          Final     Commons side effects, risks, benefits, and alternatives for medications and treatment plan prescribed today were discussed, and the patient expressed understanding of the given instructions. Patient is instructed to call or message via MyChart if he/she has any questions or concerns regarding our treatment plan. No barriers to understanding were identified. We discussed Red Flag symptoms and signs in detail. Patient expressed understanding regarding what to do in case of urgent or emergency type symptoms.   Medication list was reconciled, printed and provided to the patient in AVS. Patient instructions and summary information was reviewed with the patient as documented in  the AVS. This note was prepared with assistance of Dragon voice recognition software. Occasional wrong-word or sound-a-like substitutions may have occurred due to the inherent limitations of voice recognition software

## 2018-03-05 NOTE — Patient Instructions (Signed)
Please return in 6 months for your annual complete physical; please come fasting.   It was a pleasure meeting you today! Thank you for choosing [place] to meet your healthcare needs! I truly look forward to working with you. If you have any questions or concerns, please send me a message via Mychart or call the office at [phone].  We will call you with information regarding your referral appointment. Dermatology for your skin rash. Take the antibiotics for one week.  If you do not hear from [place] within the next 2 weeks, please let me know. It can take 1-2 weeks to get appointments set up with the specialists.

## 2018-03-12 ENCOUNTER — Ambulatory Visit (INDEPENDENT_AMBULATORY_CARE_PROVIDER_SITE_OTHER): Payer: Medicare Other | Admitting: Vascular Surgery

## 2018-03-12 ENCOUNTER — Other Ambulatory Visit: Payer: Self-pay

## 2018-03-12 ENCOUNTER — Encounter: Payer: Self-pay | Admitting: Vascular Surgery

## 2018-03-12 ENCOUNTER — Ambulatory Visit (HOSPITAL_COMMUNITY)
Admission: RE | Admit: 2018-03-12 | Discharge: 2018-03-12 | Disposition: A | Discharge status: Home / Self Care | Payer: Medicare Other | Source: Ambulatory Visit | Attending: Family | Admitting: Family

## 2018-03-12 VITALS — BP 137/75 | HR 64 | Resp 18 | Ht 66.0 in | Wt 235.0 lb

## 2018-03-12 DIAGNOSIS — I83813 Varicose veins of bilateral lower extremities with pain: Secondary | ICD-10-CM | POA: Diagnosis not present

## 2018-03-12 DIAGNOSIS — I781 Nevus, non-neoplastic: Secondary | ICD-10-CM | POA: Diagnosis not present

## 2018-03-12 DIAGNOSIS — I872 Venous insufficiency (chronic) (peripheral): Secondary | ICD-10-CM | POA: Diagnosis not present

## 2018-03-12 NOTE — Progress Notes (Signed)
Patient ID: Samuel [name], male   DOB: March 25, 1956, 62 y.o.   MRN: [id number]  Reason for Consult: New Patient (Initial Visit) (eval bil vv's w/ pain - [facility], PA)   Referred by Orlena Sheldon, PA-C  Subjective:     HPI:  Samuel [name] is a 62 y.o. male with history of left knee injury requiring him to be on full disability.  Otherwise he has no lower extremity surgeries is never had a blood clot.  Recently went to Alcoholics Anonymous and began losing weight working out daily [employer].  He is having issues with areas mostly on his left leg spider veins that he bumps and caused him to bleed.  He has never had venous intervention on either leg.  He is planning to have left knee replacement in the future.  Past Medical History:  Diagnosis Date  . Arthritis   . Former heavy cigarette smoker (20-39 per day) 03/05/2018   Quit 2000; 60 pak year history  . HTN (hypertension)    Family History  Problem Relation Age of Onset  . Dementia Mother        alcohol  . Alcohol abuse Mother   . Heart attack Father   . Heart disease Father   . COPD Father   . CAD Brother   . Healthy Daughter   . Healthy Son   . Healthy Son    Past Surgical History:  Procedure Laterality Date  . LEFT HEART CATH AND CORONARY ANGIOGRAPHY N/A 06/15/2017   Procedure: LEFT HEART CATH AND CORONARY ANGIOGRAPHY;  Surgeon: Nelva Bush, MD;  Location: [facility];  Service: Cardiovascular;  Laterality: N/A;  . TONSILLECTOMY    . UMBILICAL HERNIA REPAIR N/A 11/03/2017   Procedure: UMBILICAL HERNIA REPAIR;  Surgeon: Clovis Riley, MD;  Location: [facility];  Service: General;  Laterality: N/A;    Short Social History:  Social History   Tobacco Use  . Smoking status: Former Smoker    Packs/day: 2.00    Years: 30.00    Pack years: 60.00    Types: Cigarettes    Last attempt to quit: 06/16/1998    Years since quitting: 19.7  . Smokeless tobacco: Never Used  Substance Use Topics  . Alcohol use: No     Comment: sober 08-28-2017    Allergies  Allergen Reactions  . Other     Opioids - "scratch my face off"     Current Outpatient Medications  Medication Sig Dispense Refill  . amLODipine (NORVASC) 5 MG tablet Take 1 tablet (5 mg total) by mouth daily. 30 tablet 11  . doxycycline (VIBRA-TABS) 100 MG tablet Take 1 tablet (100 mg total) by mouth 2 (two) times daily for 7 days. (Patient not taking: Reported on 03/12/2018) 14 tablet 0  . hydrochlorothiazide (HYDRODIURIL) 25 MG tablet Take 1 tablet (25 mg total) by mouth daily. 90 tablet 3   No current facility-administered medications for this visit.     Review of Systems  Constitutional:  Constitutional negative. HENT: HENT negative.  Eyes: Eyes negative.  Respiratory: Respiratory negative.  Cardiovascular: Cardiovascular negative.  GI: Gastrointestinal negative.  Musculoskeletal: Positive for leg pain.  Neurological: Neurological negative. Hematologic: Positive for bruises/bleeds easily.  Psychiatric: Psychiatric negative.        Objective:  Objective   Vitals:   03/12/18 1436  BP: 137/75  Pulse: 64  Resp: 18  SpO2: 97%  Weight: 235 lb (106.6 kg)  Height: 5\' 6"  (1.676 m)  Body mass index is 37.93 kg/m.  Physical Exam  Constitutional: He is oriented to person, place, and time. He appears well-developed.  HENT:  Head: Normocephalic.  Eyes: Pupils are equal, round, and reactive to light.  Neck: Normal range of motion.  Cardiovascular: Normal rate.  Pulses:      Radial pulses are 2+ on the right side, and 2+ on the left side.       Dorsalis pedis pulses are 2+ on the right side, and 2+ on the left side.       Posterior tibial pulses are 2+ on the right side, and 2+ on the left side.  Abdominal: Soft.  Musculoskeletal: He exhibits no edema.  Neurological: He is alert and oriented to person, place, and time.  Skin: Skin is dry. Capillary refill takes less than 2 seconds.        Psychiatric: He has a normal  mood and affect. His behavior is normal. Judgment and thought content normal.    Data: I have independently interpreted his lower extremity reflux study which demonstrates greater saphenous vein in the right 0.88 cm on the left 0.78 cm at the saphenofemoral junction.  There is reflux throughout the right greater saphenous vein minimal in the left.  The right it is as high as 2398 ms at the knee     Assessment/Plan:     62 year old male with bilateral lower extremity spider veins worse on the left that bleed with minimal trauma.  This is consistent with C1 venous disease his saphenous veins do have reflux but likely do not merit ablation.  He would probably best be served with sclerotherapy from a spider veins standpoint.  I will have him contacted by our office pictures have been included above.     Waynetta Sandy MD [facility]

## 2018-03-19 ENCOUNTER — Other Ambulatory Visit: Payer: Self-pay

## 2018-03-19 ENCOUNTER — Encounter: Payer: Self-pay | Admitting: Physician Assistant

## 2018-03-19 ENCOUNTER — Ambulatory Visit (INDEPENDENT_AMBULATORY_CARE_PROVIDER_SITE_OTHER): Payer: Medicare Other

## 2018-03-19 ENCOUNTER — Ambulatory Visit (INDEPENDENT_AMBULATORY_CARE_PROVIDER_SITE_OTHER): Payer: Medicare Other | Admitting: Physician Assistant

## 2018-03-19 VITALS — BP 122/70 | HR 66 | Temp 97.8°F | Resp 14 | Ht 66.0 in | Wt 236.0 lb

## 2018-03-19 DIAGNOSIS — S66802A Unspecified injury of other specified muscles, fascia and tendons at wrist and hand level, left hand, initial encounter: Secondary | ICD-10-CM | POA: Diagnosis not present

## 2018-03-19 DIAGNOSIS — S6992XA Unspecified injury of left wrist, hand and finger(s), initial encounter: Secondary | ICD-10-CM | POA: Diagnosis not present

## 2018-03-19 MED ORDER — MELOXICAM 15 MG PO TABS: 15.0000 mg | ORAL_TABLET | Freq: Every day | ORAL | 0 refills | Status: DC

## 2018-03-19 NOTE — Patient Instructions (Signed)
Please wear the thumb spica splint as directed. Keep wrist elevated. Take the meloxicam once daily with food over the next 7-10 days. Tylenol for breakthrough pain.   Please go to the [place] office at [facility] for x-ray. I will call with your results and we will alter regimen and schedule follow-up at that time.     Ulnar Collateral Ligament Injury of the Thumb A ligament is a strong band of tissue that connects and supports bones. Ulnar collateral ligament (UCL) injury happens when the UCL at the base of the thumb is stretched or torn. A tear can be either partial or complete. The severity of the injury depends on how much of the ligament was damaged or torn. The UCL ligament is important for normal use of the thumb. This ligament helps you to use and move your thumb. UCL injury can happen suddenly (acuteinjury) or gradually (chronic injury) with repeated overstretching of the ligament. If it is not treated properly, UCL injury can lead to arthritis. What are the causes? This injury is caused by forcefully moving the thumb past its normal range of motion toward the wrist. If you extend your hands to catch an object or to protect yourself while falling, the force of the impact can cause your ligament to stretch too much. This excess tension can also cause your ligament to tear. What increases the risk? This injury is more likely to occur in:  People who have had a previous thumb injury or sprain.  People who play contact sports or sports that involve catching balls, such as baseball, basketball, or football.  People who do activities that increase the chance that the thumb will be pulled away from the rest of the hand.  People who have poor hand strength and flexibility.  People who do not warm up properly before activities.  What are the signs or symptoms? Symptoms of this injury include:  Pain or tenderness over the injured area with movement of the thumb.  Pain when  the injured area is pressed.  Bruising or redness at the base of the thumb. This can spread to the whole thumb and part of the hand.  Swelling over the injured area.  Difficulty grasping or pinching with the injured thumb due to weakness or pain.  If the injury is severe, a lump (mass) may be felt under the skin in the injured area. How is this diagnosed? This injury is diagnosed with a medical history and physical exam. You may also have imaging studies, including:  X-ray.  Ultrasound.  MRI.  How is this treated? Treatment varies depending on the severity of your injury. If the UCL is overstretched or partially torn, treatment usually involves keeping your thumb in a fixed position (immobilization) for a period of time. To help you do this, your health care provider will apply a brace, cast, or splint to keep your thumb from moving until it heals. If the UCL is fully torn, you may need surgery to reconnect the ligament to the bone. After surgery, a cast or splint will be applied and it will need to stay on your thumb while it heals. Your health care provider may also suggest exercises or physical therapy to strengthen your thumb. Follow these instructions at home: If you have a cast:  Do not stick anything inside the cast to scratch your skin. Doing that increases your risk of infection.  Check the skin around the cast every day. Report any concerns to your  health care provider. You may put lotion on dry skin around the edges of the cast. Do not apply lotion to the skin underneath the cast.  Keep the cast clean and dry. If you have a splint or brace:  Wear it as told by your health care provider. Remove it only as told by your health care provider.  Loosen it if your fingers become numb and tingle, or if they turn cold and blue.  Keep the brace or splint clean and dry. Bathing  Cover the cast or splint and bandage (dressing) with a watertight plastic bag to protect it from  water while you take a bath or shower. Do not let the cast or splint and dressing get wet. Managing pain, stiffness, and swelling  If directed, apply ice to the injured area: ? Put ice in a plastic bag. ? Place a towel between your skin and the bag. ? Leave the ice on for 20 minutes, 2-3 times per day.  Move your fingers often to avoid stiffness and to lessen swelling.  Raise (elevate) the injured area above the level of your heart while you are sitting or lying down. Driving  Do not drive or operate heavy machinery while taking prescription pain medicine.  Ask your health care provider when it is safe to drive if you have a cast, splint, or brace on your hand. General instructions  Do not put pressure on any part of your cast or splint until it is fully hardened. This may take several hours.  Take over-the-counter and prescription medicines only as told by your health care provider.  Keep all follow-up visits as told by your health care provider. This is important.  Do not wear rings on your injured thumb.  Do any exercise or physical therapy as told by your health care provider. Contact a health care provider if:  Your pain is not controlled with medicine.  Your bruising or swelling gets worse.  Your cast or splint is damaged.  Your thumb is numb or blue.  Your thumb feels colder than normal. This information is not intended to replace advice given to you by your health care provider. Make sure you discuss any questions you have with your health care provider. Document Released: 06/02/2005 Document Revised: 02/03/2016 Document Reviewed: 08/09/2014 Elsevier Interactive Patient Education  [employer].

## 2018-03-19 NOTE — Progress Notes (Signed)
Patient presents to clinic today c/o injury to left thumb occurring a few days ago while trying to move a well house with his wife. Notes her end slipped and he caught the well house, having his L thumb ben back significant during the process. Notes immediate pain and swelling. Denies bruising, numbness or tingling. Has applied ice to the area with improvement in swelling but no improvement in pain.   Past Medical History:  Diagnosis Date  . Arthritis   . Former heavy cigarette smoker (20-39 per day) 03/05/2018   Quit 2000; 60 pak year history  . HTN (hypertension)     Current Outpatient Medications on File Prior to Visit  Medication Sig Dispense Refill  . amLODipine (NORVASC) 5 MG tablet Take 1 tablet (5 mg total) by mouth daily. 30 tablet 11   No current facility-administered medications on file prior to visit.     Allergies  Allergen Reactions  . Other     Opioids - "scratch my face off"     Family History  Problem Relation Age of Onset  . Dementia Mother        alcohol  . Alcohol abuse Mother   . Heart attack Father   . Heart disease Father   . COPD Father   . CAD Brother   . Healthy Daughter   . Healthy Son   . Healthy Son     Social History   Socioeconomic History  . Marital status: Married    Spouse name: Not on file  . Number of children: 3  . Years of education: Not on file  . Highest education level: Not on file  Occupational History  . Occupation: disabled  Social Needs  . Financial resource strain: Not on file  . Food insecurity:    Worry: Not on file    Inability: Not on file  . Transportation needs:    Medical: Not on file    Non-medical: Not on file  Tobacco Use  . Smoking status: Former Smoker    Packs/day: 2.00    Years: 30.00    Pack years: 60.00    Types: Cigarettes    Last attempt to quit: 06/16/1998    Years since quitting: 19.7  . Smokeless tobacco: Never Used  Substance and Sexual Activity  . Alcohol use: No    Comment: sober  08-28-2017  . Drug use: No  . Sexual activity: Yes    Partners: Female  Lifestyle  . Physical activity:    Days per week: Not on file    Minutes per session: Not on file  . Stress: Not on file  Relationships  . Social connections:    Talks on phone: Not on file    Gets together: Not on file    Attends religious service: Not on file    Active member of club or organization: Not on file    Attends meetings of clubs or organizations: Not on file    Relationship status: Not on file  Other Topics Concern  . Not on file  Social History Narrative  . Not on file   Review of Systems - See HPI.  All other ROS are negative.  BP 122/70   Pulse 66   Temp 97.8 F (36.6 C) (Oral)   Resp 14   Ht '5\' 6"'$  (1.676 m)   Wt 236 lb (107 kg)   SpO2 99%   BMI 38.09 kg/m   Physical Exam  Constitutional: He appears well-developed and well-nourished.  HENT:  Head: Normocephalic and atraumatic.  Cardiovascular: Normal rate and regular rhythm.  Pulmonary/Chest: Effort normal.  Musculoskeletal:       Left wrist: He exhibits tenderness. He exhibits normal range of motion, no bony tenderness and no swelling.       Left forearm: Normal.       Left hand: He exhibits decreased range of motion (Decreased thumb opposition. No thumb laxity noted on examination.), tenderness (pain over UCL at wrist.) and bony tenderness. He exhibits normal two-point discrimination, normal capillary refill, no deformity and no swelling. Normal sensation noted. Decreased strength noted. He exhibits thumb/finger opposition.  Vitals reviewed.   Recent Results (from the past 2160 hour(s))  Basic metabolic panel     Status: Abnormal   Collection Time: 01/25/18  3:58 PM  Result Value Ref Range   Sodium 135 135 - 145 mmol/L   Potassium 3.2 (L) 3.5 - 5.1 mmol/L   Chloride 96 (L) 98 - 111 mmol/L   CO2 28 22 - 32 mmol/L   Glucose, Bld 103 (H) 70 - 99 mg/dL   BUN 13 8 - 23 mg/dL   Creatinine, Ser 1.15 0.61 - 1.24 mg/dL    Calcium 9.5 8.9 - 10.3 mg/dL   GFR calc non Af Amer >60 >60 mL/min   GFR calc Af Amer >60 >60 mL/min    Comment: (NOTE) The eGFR has been calculated using the CKD EPI equation. This calculation has not been validated in all clinical situations. eGFR's persistently <60 mL/min signify possible Chronic Kidney Disease.    Anion gap 11 5 - 15    Comment: Performed at [facility] [address] [name] [address]  CBC     Status: Abnormal   Collection Time: 01/25/18  3:58 PM  Result Value Ref Range   WBC 16.1 (H) 4.0 - 10.5 K/uL   RBC 4.51 4.22 - 5.81 MIL/uL   Hemoglobin 13.5 13.0 - 17.0 g/dL   HCT 40.1 39.0 - 52.0 %   MCV 88.9 78.0 - 100.0 fL   MCH 29.9 26.0 - 34.0 pg   MCHC 33.7 30.0 - 36.0 g/dL   RDW 12.7 11.5 - 15.5 %   Platelets 215 150 - 400 K/uL    Comment: Performed at [facility] [address], [place], [place] [address]  CBG monitoring, ED     Status: None   Collection Time: 01/25/18  4:03 PM  Result Value Ref Range   Glucose-Capillary 86 70 - 99 mg/dL  I-stat troponin, ED (0, 3)     Status: None   Collection Time: 01/25/18  4:20 PM  Result Value Ref Range   Troponin i, poc 0.00 0.00 - 0.08 ng/mL   Comment 3            Comment: Due to the release kinetics of cTnI, a negative result within the first hours of the onset of symptoms does not rule out myocardial infarction with certainty. If myocardial infarction is still suspected, repeat the test at appropriate intervals.   Urinalysis, Routine w reflex microscopic     Status: None   Collection Time: 01/25/18  5:08 PM  Result Value Ref Range   Color, Urine YELLOW YELLOW   APPearance CLEAR CLEAR   Specific Gravity, Urine 1.014 1.005 - 1.030   pH 6.0 5.0 - 8.0   Glucose, UA NEGATIVE NEGATIVE mg/dL   Hgb urine dipstick NEGATIVE NEGATIVE   Bilirubin Urine NEGATIVE NEGATIVE   Ketones, ur NEGATIVE  NEGATIVE mg/dL   Protein, ur NEGATIVE NEGATIVE mg/dL   Nitrite NEGATIVE NEGATIVE   Leukocytes, UA  NEGATIVE NEGATIVE    Comment: Performed at [facility] [address], [place], [place] [address]    Assessment/Plan: 1. Injury of ulnar collateral ligament of left wrist, initial encounter X-ray to r/o fracture giving mechanism of injury. Thumb spica splint applied. Start Meloxicam as directed. Supportive measures and OTC medications reviewed. If fracture present, will sent to Ortho. Otherwise will splint for 2 weeks and then start therapeutic exercises - DG Wrist Complete Left; Future - DG Hand Complete Left; Future   Leeanne Rio, PA-C

## 2018-03-22 DIAGNOSIS — M25561 Pain in right knee: Secondary | ICD-10-CM | POA: Diagnosis not present

## 2018-03-22 DIAGNOSIS — M25562 Pain in left knee: Secondary | ICD-10-CM | POA: Diagnosis not present

## 2018-04-29 ENCOUNTER — Encounter: Payer: Self-pay | Admitting: Family Medicine

## 2018-04-29 DIAGNOSIS — C4491 Basal cell carcinoma of skin, unspecified: Secondary | ICD-10-CM

## 2018-04-29 DIAGNOSIS — Z5181 Encounter for therapeutic drug level monitoring: Secondary | ICD-10-CM | POA: Diagnosis not present

## 2018-04-29 DIAGNOSIS — B351 Tinea unguium: Secondary | ICD-10-CM | POA: Diagnosis not present

## 2018-04-29 DIAGNOSIS — D485 Neoplasm of uncertain behavior of skin: Secondary | ICD-10-CM | POA: Diagnosis not present

## 2018-04-29 DIAGNOSIS — C44519 Basal cell carcinoma of skin of other part of trunk: Secondary | ICD-10-CM | POA: Diagnosis not present

## 2018-04-29 DIAGNOSIS — D229 Melanocytic nevi, unspecified: Secondary | ICD-10-CM | POA: Diagnosis not present

## 2018-04-29 DIAGNOSIS — D1801 Hemangioma of skin and subcutaneous tissue: Secondary | ICD-10-CM | POA: Diagnosis not present

## 2018-04-29 HISTORY — DX: Basal cell carcinoma of skin, unspecified: C44.91

## 2018-04-29 LAB — HEPATIC FUNCTION PANEL
ALT: 17 (ref 10–40)
AST: 18 (ref 14–40)

## 2018-04-29 LAB — BASIC METABOLIC PANEL
Creatinine: 1 (ref <=1.3)
Glucose: 133
Potassium: 3.9 (ref 3.4–5.3)
Sodium: 139 (ref 137–147)

## 2018-04-29 LAB — CBC AND DIFFERENTIAL
HCT: 43 (ref 41–53)
Hemoglobin: 14.2 (ref 13.5–17.5)
WBC: 8.2

## 2018-04-29 NOTE — Progress Notes (Signed)
LM to have Patient return call regarding Cologuard Screening.   Doloris Hall,  LPN

## 2018-05-06 DIAGNOSIS — Z1211 Encounter for screening for malignant neoplasm of colon: Secondary | ICD-10-CM | POA: Diagnosis not present

## 2018-05-12 LAB — COLOGUARD: Cologuard: NEGATIVE

## 2018-05-17 ENCOUNTER — Encounter: Payer: Self-pay | Admitting: Family Medicine

## 2018-05-17 DIAGNOSIS — Z1211 Encounter for screening for malignant neoplasm of colon: Secondary | ICD-10-CM | POA: Insufficient documentation

## 2018-05-17 DIAGNOSIS — Z1212 Encounter for screening for malignant neoplasm of rectum: Secondary | ICD-10-CM

## 2018-05-19 ENCOUNTER — Ambulatory Visit: Payer: Medicare Other | Admitting: Physician Assistant

## 2018-05-25 ENCOUNTER — Encounter: Payer: Self-pay | Admitting: Emergency Medicine

## 2018-05-31 ENCOUNTER — Telehealth: Payer: Self-pay | Admitting: Family Medicine

## 2018-05-31 NOTE — Telephone Encounter (Signed)
Pt states did not receive results from Cologuard. States noted on MyChart results were available for viewing; unable to find.  Results given to pt, "Negative." Pt verbalizes understanding.

## 2018-06-06 ENCOUNTER — Other Ambulatory Visit: Payer: Self-pay | Admitting: Cardiovascular Disease

## 2018-06-07 ENCOUNTER — Encounter: Payer: Self-pay | Admitting: Family Medicine

## 2018-06-07 ENCOUNTER — Ambulatory Visit (INDEPENDENT_AMBULATORY_CARE_PROVIDER_SITE_OTHER): Payer: Medicare Other | Admitting: Family Medicine

## 2018-06-07 ENCOUNTER — Other Ambulatory Visit: Payer: Self-pay

## 2018-06-07 VITALS — BP 120/62 | HR 70 | Temp 97.0°F | Resp 98 | Ht 64.0 in | Wt 235.6 lb

## 2018-06-07 DIAGNOSIS — M170 Bilateral primary osteoarthritis of knee: Secondary | ICD-10-CM | POA: Diagnosis not present

## 2018-06-07 NOTE — Patient Instructions (Signed)
Please return in 3-6 months for your annual complete physical; please come fasting.  We will call you with information regarding your referral appointment. Orthopedics for knee care.  If you do not hear from [place] within the next 2 weeks, please let me know. It can take 1-2 weeks to get appointments set up with the specialists.    If you have any questions or concerns, please don't hesitate to send me a message via MyChart or call the office at [phone]. Thank you for visiting with [place] today! It's our pleasure caring for you.

## 2018-06-07 NOTE — Progress Notes (Signed)
Subjective  CC:  Chief Complaint  Patient presents with  . Knee Pain    B knee pain, seeking to have B knee injections.. Pain level is 1/10 currently, at the end of day his pain is 7/10. He takes IBU for pain. [facility] was previously giving knee injections.    HPI: Samuel [name] is a 62 y.o. male who presents to the office today to address the problems listed above in the chief complaint.  Pt with longstanding OA bilateral knees; had been getting q 6 months Synvisc-3 injections by [facility]; however, no longer wants to go there. Due his next injection. I reviewed records. Has moderate OA. Denies redness, swelling or injury.   Assessment  1. Primary osteoarthritis of both knees      Plan   OA knees :   Refer to different ortho group for knee care.   Follow up: No follow-ups on file.  Visit date not found  No orders of the defined types were placed in this encounter.  No orders of the defined types were placed in this encounter.     I reviewed the patients updated PMH, FH, and SocHx.    Patient Active Problem List   Diagnosis Date Noted  . Screening for colorectal cancer 05/17/2018  . Nonobstructive atherosclerosis of coronary artery 03/05/2018  . Obesity (BMI 30-39.9) 03/05/2018  . Former heavy cigarette smoker (20-39 per day) 03/05/2018  . Varicose veins of bilateral lower extremities with pain 12/28/2017  . Umbilical hernia 03/47/4259  . Alcohol abuse, in remission 09/17/2017  . Osteoarthritis of knee 08/10/2017  . Rosacea 06/11/2017  . Subclinical hypothyroidism 06/05/2017  . Essential hypertension 05/27/2017  . Bilateral hearing loss 05/04/2014   Current Meds  Medication Sig  . amLODipine (NORVASC) 5 MG tablet Take 1 tablet (5 mg total) by mouth daily.  . hydrochlorothiazide (HYDRODIURIL) 25 MG tablet Take 25 mg by mouth daily.    Allergies: Patient is allergic to other. Family History: Patient family history includes Alcohol abuse in his mother; CAD  in his brother; COPD in his father; Dementia in his mother; Healthy in his daughter, son, and son; Heart attack in his father; Heart disease in his father. Social History:  Patient  reports that he quit smoking about 19 years ago. His smoking use included cigarettes. He has a 60.00 pack-year smoking history. He has never used smokeless tobacco. He reports that he does not drink alcohol or use drugs.  Review of Systems: Constitutional: Negative for fever malaise or anorexia Cardiovascular: negative for chest pain Respiratory: negative for SOB or persistent cough Gastrointestinal: negative for abdominal pain  Objective  Vitals: BP 120/62   Pulse 70   Temp (!) 97 F (36.1 C) (Oral)   Resp (!) 98   Ht 5\' 4"  (1.626 m)   Wt 235 lb 9.6 oz (106.9 kg)   SpO2 (!) 16%   BMI 40.44 kg/m  General: no acute distress , A&Ox3 B knees: no effusions or warmth or redness. FROM with crepitus. Narrow joint spaces laterally. Skin:  Warm, no rashes     Commons side effects, risks, benefits, and alternatives for medications and treatment plan prescribed today were discussed, and the patient expressed understanding of the given instructions. Patient is instructed to call or message via MyChart if he/she has any questions or concerns regarding our treatment plan. No barriers to understanding were identified. We discussed Red Flag symptoms and signs in detail. Patient expressed understanding regarding what to do in case of  urgent or emergency type symptoms.   Medication list was reconciled, printed and provided to the patient in AVS. Patient instructions and summary information was reviewed with the patient as documented in the AVS. This note was prepared with assistance of Dragon voice recognition software. Occasional wrong-word or sound-a-like substitutions may have occurred due to the inherent limitations of voice recognition software

## 2018-06-10 DIAGNOSIS — C44519 Basal cell carcinoma of skin of other part of trunk: Secondary | ICD-10-CM | POA: Diagnosis not present

## 2018-06-18 ENCOUNTER — Other Ambulatory Visit: Payer: Self-pay | Admitting: Cardiovascular Disease

## 2018-06-22 ENCOUNTER — Encounter (INDEPENDENT_AMBULATORY_CARE_PROVIDER_SITE_OTHER): Payer: Self-pay | Admitting: Orthopaedic Surgery

## 2018-06-22 ENCOUNTER — Telehealth (INDEPENDENT_AMBULATORY_CARE_PROVIDER_SITE_OTHER): Payer: Self-pay

## 2018-06-22 ENCOUNTER — Ambulatory Visit (INDEPENDENT_AMBULATORY_CARE_PROVIDER_SITE_OTHER): Payer: Self-pay

## 2018-06-22 ENCOUNTER — Ambulatory Visit (INDEPENDENT_AMBULATORY_CARE_PROVIDER_SITE_OTHER): Payer: Medicare HMO | Admitting: Orthopaedic Surgery

## 2018-06-22 ENCOUNTER — Ambulatory Visit (INDEPENDENT_AMBULATORY_CARE_PROVIDER_SITE_OTHER): Payer: Medicare HMO

## 2018-06-22 DIAGNOSIS — G8929 Other chronic pain: Secondary | ICD-10-CM | POA: Diagnosis not present

## 2018-06-22 DIAGNOSIS — M1711 Unilateral primary osteoarthritis, right knee: Secondary | ICD-10-CM

## 2018-06-22 DIAGNOSIS — M1712 Unilateral primary osteoarthritis, left knee: Secondary | ICD-10-CM | POA: Diagnosis not present

## 2018-06-22 DIAGNOSIS — M25562 Pain in left knee: Secondary | ICD-10-CM

## 2018-06-22 DIAGNOSIS — M25561 Pain in right knee: Secondary | ICD-10-CM

## 2018-06-22 NOTE — Progress Notes (Signed)
Office Visit Note   Patient: Samuel [name]           Date of Birth: Jan 13, 1956           MRN: [id number] Visit Date: 06/22/2018              Requested by: Leamon Arnt, MD [address] [place] [address] [place], [place] [address] PCP: Leamon Arnt, MD   Assessment & Plan: Visit Diagnoses:  1. Chronic pain of left knee   2. Chronic pain of right knee   3. Unilateral primary osteoarthritis, left knee   4. Unilateral primary osteoarthritis, right knee     Plan: I do feel at some point he will benefit from knee replacement surgery however we should continue to try to stay conservative as possible since he is working diligently on weight loss and quad strengthening exercises and the injections have helped.  He is definitely a candidate for hyaluronic acid for both knees so we will order this for him and hopefully see him back seem to place him in both his knees.  All question concerns were answered and addressed.  Follow-Up Instructions: He will follow-up once we have approval for hyaluronic acid for both knees and have this ordered and here.  Orders:  Orders Placed This Encounter  Procedures  . XR Knee 1-2 Views Right  . XR Knee 1-2 Views Left   No orders of the defined types were placed in this encounter.     Procedures: No procedures performed   Clinical Data: No additional findings.   Subjective: Chief Complaint  Patient presents with  . Left Knee - Pain  . Right Knee - Pain  The patient is a very pleasant 63 year old recovering alcoholic who has a history of bilateral knee osteoarthritis.  He is tried for years to get by with his knees.  He has had arthroscopic interventions of both knees as well as MRIs.  He actually had a steroid injection in both knees about a month ago.  He has had a hyaluronic acid in the past.  He is also significant at a weight last year.  He stopped drinking in March 2019 and is doing a lot to get his health back in order.  He would like to consider  hyaluronic acid injections in both knees again since it is been 6 months and those injections have helped him thus far.  He feels that working out and weight loss will also help.  He is not ready for knee replacement surgery yet but knows that that will be in the future.  He would like to talk about that some today as well.  HPI  Review of Systems He currently denies any headache, chest pain, shortness of breath, fever, chills, nausea, vomiting.  Objective: Vital Signs: There were no vitals taken for this visit.  Physical Exam He is alert and orient x3 and in no acute distress Ortho Exam Examination of both knees shows varus malalignment.  Both knees have good range of motion and are ligamentously stable but have significant patellofemoral crepitation and medial and lateral joint line tenderness.  Both knees have a mild effusion. Specialty Comments:  No specialty comments available.  Imaging: Xr Knee 1-2 Views Left  Result Date: 06/22/2018 2 views of the left knee show severe tricompartment arthritic changes with para-articular osteophytes in all 3 compartments as well as varus malalignment and joint space narrowing.  Xr Knee 1-2 Views Right  Result Date: 06/22/2018 2 views of the  right knee show severe tricompartmental arthritic changes.  There is valgus malalignment.  There are large particular osteophytes in all 3 compartments.  There is significant joint space narrowing.    PMFS History: Patient Active Problem List   Diagnosis Date Noted  . Unilateral primary osteoarthritis, left knee 06/22/2018  . Unilateral primary osteoarthritis, right knee 06/22/2018  . Screening for colorectal cancer 05/17/2018  . Nonobstructive atherosclerosis of coronary artery 03/05/2018  . Obesity (BMI 30-39.9) 03/05/2018  . Former heavy cigarette smoker (20-39 per day) 03/05/2018  . Varicose veins of bilateral lower extremities with pain 12/28/2017  . Umbilical hernia 14/70/9295  . Alcohol abuse, in  remission 09/17/2017  . Osteoarthritis of knee 08/10/2017  . Rosacea 06/11/2017  . Subclinical hypothyroidism 06/05/2017  . Essential hypertension 05/27/2017  . Bilateral hearing loss 05/04/2014   Past Medical History:  Diagnosis Date  . Arthritis   . Former heavy cigarette smoker (20-39 per day) 03/05/2018   Quit 2000; 60 pak year history  . HTN (hypertension)     Family History  Problem Relation Age of Onset  . Dementia Mother        alcohol  . Alcohol abuse Mother   . Heart attack Father   . Heart disease Father   . COPD Father   . CAD Brother   . Healthy Daughter   . Healthy Son   . Healthy Son     Past Surgical History:  Procedure Laterality Date  . LEFT HEART CATH AND CORONARY ANGIOGRAPHY N/A 06/15/2017   Procedure: LEFT HEART CATH AND CORONARY ANGIOGRAPHY;  Surgeon: Nelva Bush, MD;  Location: [facility];  Service: Cardiovascular;  Laterality: N/A;  . TONSILLECTOMY    . UMBILICAL HERNIA REPAIR N/A 11/03/2017   Procedure: UMBILICAL HERNIA REPAIR;  Surgeon: Clovis Riley, MD;  Location: [facility];  Service: General;  Laterality: N/A;   Social History   Occupational History  . Occupation: disabled  Tobacco Use  . Smoking status: Former Smoker    Packs/day: 2.00    Years: 30.00    Pack years: 60.00    Types: Cigarettes    Last attempt to quit: 06/16/1998    Years since quitting: 20.0  . Smokeless tobacco: Never Used  Substance and Sexual Activity  . Alcohol use: No    Comment: sober 08-28-2017  . Drug use: No  . Sexual activity: Yes    Partners: Female

## 2018-06-22 NOTE — Telephone Encounter (Signed)
Bilateral gel injections  

## 2018-06-24 DIAGNOSIS — B352 Tinea manuum: Secondary | ICD-10-CM | POA: Diagnosis not present

## 2018-06-24 DIAGNOSIS — B351 Tinea unguium: Secondary | ICD-10-CM | POA: Diagnosis not present

## 2018-06-25 DIAGNOSIS — Z5181 Encounter for therapeutic drug level monitoring: Secondary | ICD-10-CM | POA: Diagnosis not present

## 2018-06-29 NOTE — Telephone Encounter (Signed)
Noted  

## 2018-07-05 ENCOUNTER — Telehealth (INDEPENDENT_AMBULATORY_CARE_PROVIDER_SITE_OTHER): Payer: Self-pay

## 2018-07-05 NOTE — Telephone Encounter (Signed)
Submitted VOB for Gel-One, bilateral knee.  

## 2018-07-19 ENCOUNTER — Telehealth (INDEPENDENT_AMBULATORY_CARE_PROVIDER_SITE_OTHER): Payer: Self-pay

## 2018-07-19 NOTE — Telephone Encounter (Signed)
Talked wit patient and advised him that he is approved for gel injection.  Approved for Gel-One, bilateral knee. [place] Patient will be responsible for 20% OOP. No Co-pay PA required [place] Approval# [id number] Valid 07/09/2018- 10/08/2018  Appt. 07/26/2018 with Dr. Ninfa Linden

## 2018-07-26 ENCOUNTER — Telehealth (INDEPENDENT_AMBULATORY_CARE_PROVIDER_SITE_OTHER): Payer: Self-pay

## 2018-07-26 ENCOUNTER — Ambulatory Visit (INDEPENDENT_AMBULATORY_CARE_PROVIDER_SITE_OTHER): Payer: Medicare HMO | Admitting: Orthopaedic Surgery

## 2018-07-26 NOTE — Telephone Encounter (Signed)
Called and left a VM advising patient that his appointment today needs to be rescheduled due to not having Gel-One injection in stock.

## 2018-08-04 ENCOUNTER — Encounter (INDEPENDENT_AMBULATORY_CARE_PROVIDER_SITE_OTHER): Payer: Self-pay | Admitting: Physician Assistant

## 2018-08-04 ENCOUNTER — Ambulatory Visit (INDEPENDENT_AMBULATORY_CARE_PROVIDER_SITE_OTHER): Payer: Medicare HMO | Admitting: Physician Assistant

## 2018-08-04 DIAGNOSIS — M1711 Unilateral primary osteoarthritis, right knee: Secondary | ICD-10-CM

## 2018-08-04 DIAGNOSIS — M1712 Unilateral primary osteoarthritis, left knee: Secondary | ICD-10-CM

## 2018-08-04 MED ORDER — CROSS-LINK HYAL ACID (VISC) 30 MG/3ML IX PRSY
30.0000 mg | PREFILLED_SYRINGE | INTRA_ARTICULAR | Status: AC | PRN
  Administered 2018-08-04: 30 mg via INTRA_ARTICULAR

## 2018-08-04 NOTE — Progress Notes (Signed)
   Procedure Note  Patient: Samuel [name]             Date of Birth: Aug 13, 1955           MRN: [id number]             Visit Date: 08/04/2018 HPI: Mr. Schiff comes in today for supplemental injections both knees.  He has known osteoarthritis of both knees.  He has had no new injury.  Still would like to try conservative treatment prior to any type of surgical intervention.  Physical exam: Bilateral knees good range of motion of both knees.  No effusion abnormal warmth erythema of either knee. Procedures: Visit Diagnoses: Unilateral primary osteoarthritis, left knee  Unilateral primary osteoarthritis, right knee  Large Joint Inj: bilateral knee on 08/04/2018 2:12 PM Indications: pain Details: 22 G 1.5 in needle, anterolateral approach  Arthrogram: No  Medications (Right): 30 mg Cross-Linked Hyaluronate 30 MG/3ML Medications (Left): 30 mg Cross-Linked Hyaluronate 30 MG/3ML Outcome: tolerated well, no immediate complications Procedure, treatment alternatives, risks and benefits explained, specific risks discussed. Consent was given by the patient. Immediately prior to procedure a time out was called to verify the correct patient, procedure, equipment, support staff and site/side marked as required. Patient was prepped and draped in the usual sterile fashion.     Plan: He understands he needs to wait 6 months between supplemental injections.  He will follow-up with [place] on a as needed basis.  Continue to work on quad strengthening both knees.

## 2018-08-17 ENCOUNTER — Encounter: Payer: Self-pay | Admitting: Family Medicine

## 2018-08-17 ENCOUNTER — Other Ambulatory Visit: Payer: Self-pay

## 2018-08-17 ENCOUNTER — Ambulatory Visit (INDEPENDENT_AMBULATORY_CARE_PROVIDER_SITE_OTHER): Payer: Medicare HMO | Admitting: Family Medicine

## 2018-08-17 VITALS — BP 118/66 | HR 72 | Temp 98.7°F | Resp 16 | Ht 66.0 in | Wt 231.0 lb

## 2018-08-17 DIAGNOSIS — I10 Essential (primary) hypertension: Secondary | ICD-10-CM

## 2018-08-17 DIAGNOSIS — J208 Acute bronchitis due to other specified organisms: Secondary | ICD-10-CM | POA: Diagnosis not present

## 2018-08-17 DIAGNOSIS — M170 Bilateral primary osteoarthritis of knee: Secondary | ICD-10-CM | POA: Diagnosis not present

## 2018-08-17 DIAGNOSIS — M5126 Other intervertebral disc displacement, lumbar region: Secondary | ICD-10-CM

## 2018-08-17 DIAGNOSIS — B9689 Other specified bacterial agents as the cause of diseases classified elsewhere: Secondary | ICD-10-CM

## 2018-08-17 MED ORDER — PREDNISONE 10 MG PO TABS: ORAL_TABLET | ORAL | 0 refills | Status: DC

## 2018-08-17 MED ORDER — AZITHROMYCIN 250 MG PO TABS: ORAL_TABLET | ORAL | 0 refills | Status: DC

## 2018-08-17 NOTE — Patient Instructions (Addendum)
Please return in august for your annual complete physical; please come fasting.   If you have any questions or concerns, please don't hesitate to send me a message via MyChart or call the office at [phone]. Thank you for visiting with [place] today! It's our pleasure caring for you.    Acute Bronchitis, Adult  Acute bronchitis is sudden (acute) swelling of the air tubes (bronchi) in the lungs. Acute bronchitis causes these tubes to fill with mucus, which can make it hard to breathe. It can also cause coughing or wheezing. In adults, acute bronchitis usually goes away within 2 weeks. A cough caused by bronchitis may last up to 3 weeks. Smoking, allergies, and asthma can make the condition worse. Repeated episodes of bronchitis may cause further lung problems, such as chronic obstructive pulmonary disease (COPD). What are the causes? This condition can be caused by germs and by substances that irritate the lungs, including:  Cold and flu viruses. This condition is most often caused by the same virus that causes a cold.  Bacteria.  Exposure to tobacco smoke, dust, fumes, and air pollution. What increases the risk? This condition is more likely to develop in people who:  Have close contact with someone with acute bronchitis.  Are exposed to lung irritants, such as tobacco smoke, dust, fumes, and vapors.  Have a weak immune system.  Have a respiratory condition such as asthma. What are the signs or symptoms? Symptoms of this condition include:  A cough.  Coughing up clear, yellow, or green mucus.  Wheezing.  Chest congestion.  Shortness of breath.  A fever.  Body aches.  Chills.  A sore throat. How is this diagnosed? This condition is usually diagnosed with a physical exam. During the exam, your health care provider may order tests, such as chest X-rays, to rule out other conditions. He or she may also:  Test a sample of your mucus for bacterial infection.  Check the  level of oxygen in your blood. This is done to check for pneumonia.  Do a chest X-ray or lung function testing to rule out pneumonia and other conditions.  Perform blood tests. Your health care provider will also ask about your symptoms and medical history. How is this treated? Most cases of acute bronchitis clear up over time without treatment. Your health care provider may recommend:  Drinking more fluids. Drinking more makes your mucus thinner, which may make it easier to breathe.  Taking a medicine for a fever or cough.  Taking an antibiotic medicine.  Using an inhaler to help improve shortness of breath and to control a cough.  Using a cool mist vaporizer or humidifier to make it easier to breathe. Follow these instructions at home: Medicines  Take over-the-counter and prescription medicines only as told by your health care provider.  If you were prescribed an antibiotic, take it as told by your health care provider. Do not stop taking the antibiotic even if you start to feel better. General instructions   Get plenty of rest.  Drink enough fluids to keep your urine pale yellow.  Avoid smoking and secondhand smoke. Exposure to cigarette smoke or irritating chemicals will make bronchitis worse. If you smoke and you need help quitting, ask your health care provider. Quitting smoking will help your lungs heal faster.  Use an inhaler, cool mist vaporizer, or humidifier as told by your health care provider.  Keep all follow-up visits as told by your health care provider. This is important. How is  this prevented? To lower your risk of getting this condition again:  Wash your hands often with soap and water. If soap and water are not available, use hand sanitizer.  Avoid contact with people who have cold symptoms.  Try not to touch your hands to your mouth, nose, or eyes.  Make sure to get the flu shot every year. Contact a health care provider if:  Your symptoms do not  improve in 2 weeks of treatment. Get help right away if:  You cough up blood.  You have chest pain.  You have severe shortness of breath.  You become dehydrated.  You faint or keep feeling like you are going to faint.  You keep vomiting.  You have a severe headache.  Your fever or chills gets worse. This information is not intended to replace advice given to you by your health care provider. Make sure you discuss any questions you have with your health care provider. Document Released: 07/10/2004 Document Revised: 01/14/2017 Document Reviewed: 11/21/2015 Elsevier Interactive Patient Education  2019 [employer].

## 2018-08-17 NOTE — Progress Notes (Signed)
Subjective  CC:  Chief Complaint  Patient presents with  . Cough    About 2 weeks, OTC meds.. Has thick mucous   . Back Pain    Bilateral sides lower back    HPI: Samuel [name] is a 63 y.o. male who presents to the office today to address the problems listed above in the chief complaint.  Patient complains of 2 weeks of URI symptoms that have progressed to thick productive cough and postnasal drainage.  He denies dyspnea on exertion or fevers.  No history of asthma or COPD.  Cough is interfering with sleep and he just went and bought some nighttime cough medicine.  Wife is home sick with similar symptoms.  He denies sinus pain.  Hypertension f/u: Control is good . Pt reports he is doing well. taking medications as instructed, no medication side effects noted, no TIAs, no chest pain on exertion, no dyspnea on exertion, no swelling of ankles. He denies adverse effects from his BP medications. Compliance with medication is good.   Complains of several month history of intermittent radicular pain that starts in the right lower back and goes down to the ankle.  No weakness of the extremities.  No bowel or bladder incontinence.  He has known lumbar herniated disc by prior evaluations.  Requesting orthopedic mattress.  Prefers not to take medicines.  He is using Advil as needed.  Also uses NSAIDs for end-stage osteoarthritis of his knees.  He is seeing orthopedics for this.  Recent Visco supplementation injection did not help alleviate pain on the right.  Assessment  1. Acute bacterial bronchitis   2. Lumbar herniated disc   3. Essential hypertension   4. Primary osteoarthritis of both knees      Plan    Hypertension f/u: BP control is well controlled.  Continue current management.  Due for labs and complete physical in August  Osteoarthritis f/u: Eligible for knee replacement bilaterally but patient is trying to hold off.  Follow-up with Ortho  Herniated disc with radicular symptoms:  Recommend prednisone taper.  Patient agrees.  Follow-up with Ortho if needed.  No red flag symptoms identified today.  Bacterial bronchitis: Given length of symptoms and worsening symptoms, antibiotics ordered. Education regarding management of these chronic disease states was given. Management strategies discussed on successive visits include dietary and exercise recommendations, goals of achieving and maintaining IBW, and lifestyle modifications aiming for adequate sleep and minimizing stressors.   Follow up: No follow-ups on file.  No orders of the defined types were placed in this encounter.  Meds ordered this encounter  Medications  . azithromycin (ZITHROMAX) 250 MG tablet    Sig: Take 2 tabs today, then 1 tab daily for 4 days    Dispense:  1 each    Refill:  0  . predniSONE (DELTASONE) 10 MG tablet    Sig: Take 4 tabs qd x 2 days, 3 qd x 2 days, 2 qd x 2d, 1qd x 3 days    Dispense:  21 tablet    Refill:  0      BP Readings from Last 3 Encounters:  08/17/18 118/66  06/07/18 120/62  03/19/18 122/70   Wt Readings from Last 3 Encounters:  08/17/18 231 lb (104.8 kg)  06/07/18 235 lb 9.6 oz (106.9 kg)  03/19/18 236 lb (107 kg)    Lab Results  Component Value Date   CHOL 176 06/12/2017   Lab Results  Component Value Date   HDL 49  06/12/2017   Lab Results  Component Value Date   LDLCALC 112 (H) 06/12/2017   Lab Results  Component Value Date   TRIG 73 06/12/2017   Lab Results  Component Value Date   CHOLHDL 3.6 06/12/2017   No results found for: LDLDIRECT Lab Results  Component Value Date   CREATININE 1.0 04/29/2018   BUN 13 01/25/2018   NA 139 04/29/2018   K 3.9 04/29/2018   CL 96 (L) 01/25/2018   CO2 28 01/25/2018    The 10-year ASCVD risk score Mikey Bussing DC Jr., et al., 2013) is: 11%   Values used to calculate the score:     Age: 69 years     Sex: Male     Is Non-Hispanic African American: No     Diabetic: No     Tobacco smoker: No     Systolic Blood  Pressure: 118 mmHg     Is BP treated: Yes     HDL Cholesterol: 49 mg/dL     Total Cholesterol: 176 mg/dL  I reviewed the patients updated PMH, FH, and SocHx.    Patient Active Problem List   Diagnosis Date Noted  . Unilateral primary osteoarthritis, left knee 06/22/2018  . Unilateral primary osteoarthritis, right knee 06/22/2018  . Screening for colorectal cancer 05/17/2018  . Nonobstructive atherosclerosis of coronary artery 03/05/2018  . Obesity (BMI 30-39.9) 03/05/2018  . Former heavy cigarette smoker (20-39 per day) 03/05/2018  . Varicose veins of bilateral lower extremities with pain 12/28/2017  . Umbilical hernia 83/15/1761  . Alcohol abuse, in remission 09/17/2017  . Osteoarthritis of knee 08/10/2017  . Rosacea 06/11/2017  . Subclinical hypothyroidism 06/05/2017  . Essential hypertension 05/27/2017  . Bilateral hearing loss 05/04/2014    Allergies: Other  Social History: Patient  reports that he quit smoking about 20 years ago. His smoking use included cigarettes. He has a 60.00 pack-year smoking history. He has never used smokeless tobacco. He reports that he does not drink alcohol or use drugs.  Current Meds  Medication Sig  . amLODipine (NORVASC) 5 MG tablet Take 1 tablet (5 mg total) by mouth daily.  . hydrochlorothiazide (HYDRODIURIL) 25 MG tablet TAKE ONE TABLET BY MOUTH DAILY    Review of Systems: Cardiovascular: negative for chest pain, palpitations, leg swelling, orthopnea Respiratory: negative for SOB, wheezing or persistent cough Gastrointestinal: negative for abdominal pain Genitourinary: negative for dysuria or gross hematuria  Objective  Vitals: BP 118/66   Pulse 72   Temp 98.7 F (37.1 C) (Oral)   Resp 16   Ht 5\' 6"  (1.676 m)   Wt 231 lb (104.8 kg)   SpO2 98%   BMI 37.28 kg/m  General: no acute distress  Psych:  Alert and oriented, normal mood and affect HEENT:  Normocephalic, atraumatic, supple neck  Cardiovascular:  RRR without murmur.  no edema Respiratory:  Good breath sounds bilaterally, CTAB with normal respiratory effort Skin:  Warm, no rashes Neurologic:   Mental status is normal  Commons side effects, risks, benefits, and alternatives for medications and treatment plan prescribed today were discussed, and the patient expressed understanding of the given instructions. Patient is instructed to call or message via MyChart if he/she has any questions or concerns regarding our treatment plan. No barriers to understanding were identified. We discussed Red Flag symptoms and signs in detail. Patient expressed understanding regarding what to do in case of urgent or emergency type symptoms.   Medication list was reconciled, printed and provided to the patient  in AVS. Patient instructions and summary information was reviewed with the patient as documented in the AVS. This note was prepared with assistance of Dragon voice recognition software. Occasional wrong-word or sound-a-like substitutions may have occurred due to the inherent limitations of voice recognition software

## 2018-08-25 ENCOUNTER — Ambulatory Visit: Payer: Self-pay | Admitting: Family Medicine

## 2018-08-25 NOTE — Telephone Encounter (Signed)
Message from Berneta Levins sent at 08/25/2018 2:55 PM EDT   Summary: medication request   Pt states he was given predniSONE (DELTASONE) 10 MG tablet and it worked well for him. Pt states that he finished medication about 4 days ago and is now feeling like he was when he came in originally. Pt wants to know if predniSONE (DELTASONE) 10 MG tablet can be given again. Pt uses [facility] [address] [place] [phone] (Phone) [phone] (Fax)         Message left for pt to return call to office, but no return call at this time.

## 2018-08-26 NOTE — Telephone Encounter (Signed)
Please clarify: coughing is back? Would recommend steroid inhaler if so rather than PO pred.   Or is he talking about his back/leg pain?

## 2018-08-26 NOTE — Telephone Encounter (Signed)
Do you want patient to be seen again or are you okay with calling in medication?

## 2018-08-27 ENCOUNTER — Telehealth (INDEPENDENT_AMBULATORY_CARE_PROVIDER_SITE_OTHER): Payer: Self-pay | Admitting: Orthopaedic Surgery

## 2018-08-27 MED ORDER — PREDNISONE 10 MG PO TABS: ORAL_TABLET | ORAL | 0 refills | Status: DC

## 2018-08-27 NOTE — Telephone Encounter (Signed)
Will treat with another round of prednisone, but will not be good long term solution. Refilled but no further pred for knee pain from me. Will need to f/u with ortho.

## 2018-08-27 NOTE — Telephone Encounter (Signed)
LMOM for patient of the below message  

## 2018-08-27 NOTE — Telephone Encounter (Signed)
Pt. Reports his breathing issues are better. He is continuing to have bilateral knee pain. States the Pred pack "really helped my pain. And I would like another round of Prednisone if possible." [facility]. Please advise pt.

## 2018-08-27 NOTE — Telephone Encounter (Signed)
Please advise 

## 2018-08-27 NOTE — Telephone Encounter (Signed)
We don't usually keep patients on prednisone.  We do provide steroid dose packs for short intervals, but with regular prednisone, he'll still need to go thru his PCP.

## 2018-08-27 NOTE — Addendum Note (Signed)
Addended by: Billey Chang on: 08/27/2018 11:44 AM   Modules accepted: Orders

## 2018-08-27 NOTE — Telephone Encounter (Signed)
Patient called stating that he received an Rx of Prednisone from his primary care doctor, which helped his pain, but it takes 7-8 phone calls to get through to the office.  He wanted to know if Dr. Ninfa Linden would send in an RX for Prednisone.  CB#[phone].  Thank you.

## 2018-08-27 NOTE — Telephone Encounter (Signed)
Called pt and LMOVM to verify what reason the prednisone would need to be refilled.   [place] for [facility] to Discuss results / PCP recommendations / Schedule patient.

## 2018-08-27 NOTE — Telephone Encounter (Signed)
LMOVM

## 2018-09-06 ENCOUNTER — Telehealth: Payer: Self-pay

## 2018-09-06 NOTE — Telephone Encounter (Signed)
Copied from [place] [id number]. Topic: General - Inquiry >> Sep 06, 2018 10:05 AM Alanda Slim E wrote: Reason for CRM: Can Dr. Jonni Sanger call [facility] [phone] and give pre cert for durable medical equipment to get an orthopedic bed/mattress Pt stated he discussed it with Dr. Jonni Sanger at one point

## 2018-09-07 NOTE — Telephone Encounter (Signed)
Is this something you can do or does pt need to contact his ortho Dr?

## 2018-09-07 NOTE — Telephone Encounter (Signed)
That is what I recommended to him: call and request from ortho.

## 2018-09-08 NOTE — Telephone Encounter (Signed)
Patient called back, he is states that he has contacted his insurance [employer], [employer] (histocompatibility and immunogenetics)) and was told can get an order from his PCP for the orthopedic mattress. I placed him on hold to speak with Dr Jonni Sanger again in regards to order. She stated that due to the needed qualifications and he already having a Orthopedic doctor he should contact them for the proper order to get this approved by his insurance. This was explained to pt and he was expressed he was very unhappy but would contact his Ortho dr.

## 2018-09-08 NOTE — Telephone Encounter (Signed)
LMOVM with details he is to call his ortho to request.

## 2018-10-25 ENCOUNTER — Telehealth: Payer: Self-pay | Admitting: Physician Assistant

## 2018-10-25 NOTE — Telephone Encounter (Signed)
Ok to schedule as a VV

## 2018-10-25 NOTE — Telephone Encounter (Signed)
Can this pt be scheduled as a VV or a in office visit?   Copied from [place] [id number]. Topic: Appointment Scheduling - Scheduling Inquiry for Clinic >> Oct 25, 2018  1:47 PM Vernona Rieger wrote: Reason for CRM: Patient of Dr Tamela Oddi would like to stay at [facility] and would like to establish with Elyn Aquas, PA. Patient would like to set up an appt and wants to be seen for his back pain asap. He said he was advised last week he could stay at [facility] with Elyn Aquas, [place].

## 2018-10-26 ENCOUNTER — Other Ambulatory Visit: Payer: Self-pay

## 2018-10-26 ENCOUNTER — Encounter: Payer: Self-pay | Admitting: Physician Assistant

## 2018-10-26 ENCOUNTER — Ambulatory Visit (INDEPENDENT_AMBULATORY_CARE_PROVIDER_SITE_OTHER): Payer: Medicare HMO | Admitting: Physician Assistant

## 2018-10-26 VITALS — BP 126/60

## 2018-10-26 DIAGNOSIS — M5441 Lumbago with sciatica, right side: Secondary | ICD-10-CM | POA: Diagnosis not present

## 2018-10-26 DIAGNOSIS — M5442 Lumbago with sciatica, left side: Secondary | ICD-10-CM | POA: Diagnosis not present

## 2018-10-26 MED ORDER — CYCLOBENZAPRINE HCL 5 MG PO TABS: 5.0000 mg | ORAL_TABLET | Freq: Every day | ORAL | 0 refills | Status: DC

## 2018-10-26 MED ORDER — METHYLPREDNISOLONE 4 MG PO TBPK: ORAL_TABLET | ORAL | 0 refills | Status: DC

## 2018-10-26 NOTE — Progress Notes (Signed)
I have discussed the procedure for the virtual visit with the patient who has given consent to proceed with assessment and treatment.   Samuel [name], [name]     

## 2018-10-26 NOTE — Patient Instructions (Signed)
Instructions sent to MyChart.  Please take the steroid pack as directed. Tylenol for breakthrough pain. Avoid Advil while taking the steroid.  Use the muscle relaxant in the evening.  Avoid heavy lifting or overexertion. Follow-up in 1 week for reassessment.  Let us know immediately if there are any new or worsening symptoms.    Sciatica Rehab Ask your health care provider which exercises are safe for you. Do exercises exactly as told by your health care provider and adjust them as directed. It is normal to feel mild stretching, pulling, tightness, or discomfort as you do these exercises, but you should stop right away if you feel sudden pain or your pain gets worse.Do not begin these exercises until told by your health care provider. Stretching and range of motion exercises These exercises warm up your muscles and joints and improve the movement and flexibility of your hips and your back. These exercises also help to relieve pain, numbness, and tingling. Exercise A: Sciatic nerve glide 1. Sit in a chair with your head facing down toward your chest. Place your hands behind your back. Let your shoulders slump forward. 2. Slowly straighten one of your knees while you tilt your head back as if you are looking toward the ceiling. Only straighten your leg as far as you can without making your symptoms worse. 3. Hold for __________ seconds. 4. Slowly return to the starting position. 5. Repeat with your other leg. Repeat __________ times. Complete this exercise __________ times a day. Exercise B: Knee to chest with hip adduction and internal rotation  1. Lie on your back on a firm surface with both legs straight. 2. Bend one of your knees and move it up toward your chest until you feel a gentle stretch in your lower back and buttock. Then, move your knee toward the shoulder that is on the opposite side from your leg. ? Hold your leg in this position by holding onto the front of your knee. 3.  Hold for __________ seconds. 4. Slowly return to the starting position. 5. Repeat with your other leg. Repeat __________ times. Complete this exercise __________ times a day. Exercise C: Prone extension on elbows  1. Lie on your abdomen on a firm surface. A bed may be too soft for this exercise. 2. Prop yourself up on your elbows. 3. Use your arms to help lift your chest up until you feel a gentle stretch in your abdomen and your lower back. ? This will place some of your body weight on your elbows. If this is uncomfortable, try stacking pillows under your chest. ? Your hips should stay down, against the surface that you are lying on. Keep your hip and back muscles relaxed. 4. Hold for __________ seconds. 5. Slowly relax your upper body and return to the starting position. Repeat __________ times. Complete this exercise __________ times a day. Strengthening exercises These exercises build strength and endurance in your back. Endurance is the ability to use your muscles for a long time, even after they get tired. Exercise D: Pelvic tilt 1. Lie on your back on a firm surface. Bend your knees and keep your feet flat. 2. Tense your abdominal muscles. Tip your pelvis up toward the ceiling and flatten your lower back into the floor. ? To help with this exercise, you may place a small towel under your lower back and try to push your back into the towel. 3. Hold for __________ seconds. 4. Let your muscles relax completely before you repeat  this exercise. Repeat __________ times. Complete this exercise __________ times a day. Exercise E: Alternating arm and leg raises  1. Get on your hands and knees on a firm surface. If you are on a hard floor, you may want to use padding to cushion your knees, such as an exercise mat. 2. Line up your arms and legs. Your hands should be below your shoulders, and your knees should be below your hips. 3. Lift your left leg behind you. At the same time, raise your  right arm and straighten it in front of you. ? Do not lift your leg higher than your hip. ? Do not lift your arm higher than your shoulder. ? Keep your abdominal and back muscles tight. ? Keep your hips facing the ground. ? Do not arch your back. ? Keep your balance carefully, and do not hold your breath. 4. Hold for __________ seconds. 5. Slowly return to the starting position and repeat with your right leg and your left arm. Repeat __________ times. Complete this exercise __________ times a day. Posture and body mechanics  Body mechanics refers to the movements and positions of your body while you do your daily activities. Posture is part of body mechanics. Good posture and healthy body mechanics can help to relieve stress in your body's tissues and joints. Good posture means that your spine is in its natural S-curve position (your spine is neutral), your shoulders are pulled back slightly, and your head is not tipped forward. The following are general guidelines for applying improved posture and body mechanics to your everyday activities. Standing   When standing, keep your spine neutral and your feet about hip-width apart. Keep a slight bend in your knees. Your ears, shoulders, and hips should line up.  When you do a task in which you stand in one place for a long time, place one foot up on a stable object that is 2-4 inches (5-10 cm) high, such as a footstool. This helps keep your spine neutral. Sitting   When sitting, keep your spine neutral and keep your feet flat on the floor. Use a footrest, if necessary, and keep your thighs parallel to the floor. Avoid rounding your shoulders, and avoid tilting your head forward.  When working at a desk or a computer, keep your desk at a height where your hands are slightly lower than your elbows. Slide your chair under your desk so you are close enough to maintain good posture.  When working at a computer, place your monitor at a height where  you are looking straight ahead and you do not have to tilt your head forward or downward to look at the screen. Resting   When lying down and resting, avoid positions that are most painful for you.  If you have pain with activities such as sitting, bending, stooping, or squatting (flexion-based activities), lie in a position in which your body does not bend very much. For example, avoid curling up on your side with your arms and knees near your chest (fetal position).  If you have pain with activities such as standing for a long time or reaching with your arms (extension-based activities), lie with your spine in a neutral position and bend your knees slightly. Try the following positions: ? Lying on your side with a pillow between your knees. ? Lying on your back with a pillow under your knees. Lifting   When lifting objects, keep your feet at least shoulder-width apart and tighten your abdominal muscles.  Bend your knees and hips and keep your spine neutral. It is important to lift using the strength of your legs, not your back. Do not lock your knees straight out.  Always ask for help to lift heavy or awkward objects. This information is not intended to replace advice given to you by your health care provider. Make sure you discuss any questions you have with your health care provider. Document Released: 06/02/2005 Document Revised: 02/07/2016 Document Reviewed: 02/16/2015 Elsevier Interactive Patient Education  2019 [employer].

## 2018-10-26 NOTE — Progress Notes (Signed)
Virtual Visit via Video   I connected with patient on 10/26/18 at  8:00 AM EDT by a video enabled telemedicine application and verified that I am speaking with the correct person using two identifiers.  Location patient: Home Location provider: [facility], Office Persons participating in the virtual visit: Patient, Provider, [place] (Patina Moore)  I discussed the limitations of evaluation and management by telemedicine and the availability of in person appointments. The patient expressed understanding and agreed to proceed.  Subjective:   HPI:   Patient presents today via [employer] for discussion of bilateral low back pain x 3 weeks. Denies trauma or injury. Notes symptoms began after doing a lot of yard work. Has history of lumbar disc disease but no issue in over 10 years. Notes pain is aching and has occasional radiation into lower extremities. Denies numbness, tingling or weakness of extremities. Denies saddle anesthesia or change to bowel/bladder habits. Has been taking Advil which is helping with symptoms.   ROS:   See pertinent positives and negatives per HPI.  Patient Active Problem List   Diagnosis Date Noted  . Unilateral primary osteoarthritis, left knee 06/22/2018  . Unilateral primary osteoarthritis, right knee 06/22/2018  . Screening for colorectal cancer 05/17/2018  . Nonobstructive atherosclerosis of coronary artery 03/05/2018  . Obesity (BMI 30-39.9) 03/05/2018  . Former heavy cigarette smoker (20-39 per day) 03/05/2018  . Varicose veins of bilateral lower extremities with pain 12/28/2017  . Umbilical hernia 38/75/6433  . Alcohol abuse, in remission 09/17/2017  . Osteoarthritis of knee 08/10/2017  . Rosacea 06/11/2017  . Subclinical hypothyroidism 06/05/2017  . Essential hypertension 05/27/2017  . Bilateral hearing loss 05/04/2014    Social History   Tobacco Use  . Smoking status: Former Smoker    Packs/day: 2.00    Years: 30.00    Pack years:  60.00    Types: Cigarettes    Last attempt to quit: 06/16/1998    Years since quitting: 20.3  . Smokeless tobacco: Never Used  Substance Use Topics  . Alcohol use: No    Comment: sober 08-28-2017    Current Outpatient Medications:  .  amLODipine (NORVASC) 5 MG tablet, Take 1 tablet (5 mg total) by mouth daily., Disp: 30 tablet, Rfl: 11 .  hydrochlorothiazide (HYDRODIURIL) 25 MG tablet, TAKE ONE TABLET BY MOUTH DAILY, Disp: 90 tablet, Rfl: 3 .  ibuprofen (ADVIL) 200 MG tablet, Take 200 mg by mouth every 6 (six) hours as needed., Disp: , Rfl:  .  cyclobenzaprine (FLEXERIL) 5 MG tablet, Take 1 tablet (5 mg total) by mouth at bedtime., Disp: 15 tablet, Rfl: 0 .  methylPREDNISolone (MEDROL DOSEPAK) 4 MG TBPK tablet, Take following package directions, Disp: 21 tablet, Rfl: 0  Allergies  Allergen Reactions  . Other Itching and Other (See Comments)    Opioids - "scratch my face off"     Objective:   BP 126/60   Patient is well-developed, well-nourished in no acute distress.  Resting comfortably at home.  Head is normocephalic, atraumatic.  No labored breathing.  Speech is clear and coherent with logical contest.  Patient is alert and oriented at baseline.   Assessment and Plan:   1. Acute bilateral low back pain with bilateral sciatica No alarm signs/symptoms. + history of herniated disc but no issue in 10 years. Rest, heating pad. Tylenol for pain. Start Medrol pack. Rx Flexeril at low-dose to use in evening. If not resolving, will need repeat imaging and in-office assessment.   - methylPREDNISolone (MEDROL  DOSEPAK) 4 MG TBPK tablet; Take following package directions  Dispense: 21 tablet; Refill: 0 - cyclobenzaprine (FLEXERIL) 5 MG tablet; Take 1 tablet (5 mg total) by mouth at bedtime.  Dispense: 15 tablet; Refill: 0 .   Leeanne Rio, PA-C 10/26/2018

## 2018-11-15 ENCOUNTER — Telehealth: Payer: Self-pay | Admitting: Physician Assistant

## 2018-11-15 DIAGNOSIS — I10 Essential (primary) hypertension: Secondary | ICD-10-CM

## 2018-11-15 NOTE — Telephone Encounter (Signed)
Copied from [place] [id number]. Topic: Quick Communication - Rx Refill/Question >> Nov 15, 2018  3:51 PM Nils Flack, Marland Kitchen wrote: Medication: amlodipine  Has the patient contacted their pharmacy? Yes.   (Agent: If no, request that the patient contact the pharmacy for the refill.) (Agent: If yes, when and what did the pharmacy advise?)  Preferred Pharmacy (with phone number or street name): [facility]   Agent: Please be advised that RX refills may take up to 3 business days. We ask that you follow-up with your pharmacy

## 2018-11-16 ENCOUNTER — Encounter: Payer: Self-pay | Admitting: Emergency Medicine

## 2018-11-16 MED ORDER — AMLODIPINE BESYLATE 5 MG PO TABS: 5.0000 mg | ORAL_TABLET | Freq: Every day | ORAL | 0 refills | Status: DC

## 2018-11-16 NOTE — Telephone Encounter (Signed)
Amlodipine was refilled to the preferred pharmacy for patient. My chart message sent to patient to notify of needing to schedule an in office physical.

## 2018-11-24 DIAGNOSIS — M1711 Unilateral primary osteoarthritis, right knee: Secondary | ICD-10-CM | POA: Diagnosis not present

## 2018-11-24 DIAGNOSIS — M222X1 Patellofemoral disorders, right knee: Secondary | ICD-10-CM | POA: Diagnosis not present

## 2018-11-24 DIAGNOSIS — M25561 Pain in right knee: Secondary | ICD-10-CM | POA: Diagnosis not present

## 2018-11-24 DIAGNOSIS — M25562 Pain in left knee: Secondary | ICD-10-CM | POA: Diagnosis not present

## 2018-11-24 DIAGNOSIS — M25761 Osteophyte, right knee: Secondary | ICD-10-CM | POA: Diagnosis not present

## 2018-11-30 DIAGNOSIS — M25762 Osteophyte, left knee: Secondary | ICD-10-CM | POA: Diagnosis not present

## 2018-11-30 DIAGNOSIS — M1712 Unilateral primary osteoarthritis, left knee: Secondary | ICD-10-CM | POA: Diagnosis not present

## 2018-11-30 DIAGNOSIS — M25562 Pain in left knee: Secondary | ICD-10-CM | POA: Diagnosis not present

## 2018-12-01 DIAGNOSIS — M25561 Pain in right knee: Secondary | ICD-10-CM | POA: Diagnosis not present

## 2018-12-01 DIAGNOSIS — M1711 Unilateral primary osteoarthritis, right knee: Secondary | ICD-10-CM | POA: Diagnosis not present

## 2018-12-01 DIAGNOSIS — M25761 Osteophyte, right knee: Secondary | ICD-10-CM | POA: Diagnosis not present

## 2018-12-05 NOTE — Progress Notes (Signed)
Patient presents to clinic today for annual exam.  Patient is fasting for labs.  Chronic Issues: HTN: Patient is currently on a regimen of Amlodipine 5 mg and HCTZ 25 mg daily. Endorses taking medications as directed. Patient denies chest pain, palpitations, lightheadedness, dizziness, vision changes or frequent headaches. Notes he does check BP at home a few times per week, noting is averages 120-130s/60-75 at home.   BP Readings from Last 3 Encounters:  12/06/18 134/68  10/26/18 126/60  08/17/18 118/66   Patient also with history of end-stage osteoarthritis of knees bilaterally. Is trying to postpone surgery as long as possible at present. was followed initially by [facility], recently switching to [facility] and now seeing [facility] in [place], starting the GenX injections. Has follow-up scheduled with specialist within 2 weeks.  Patient also with history of lumbar herniated disc,previously followed by Neurosurgery. Is again trying to put off surgery. Is managing with OTC medications and recommendations from Pain specialist. Is requesting a prescription for an orthopedic mattress to help alleviate some of his symptoms that are very severe on waking in the morning.   Health Maintenance: Immunizations -- UTD Colonoscopy -- UTD  Past Medical History:  Diagnosis Date  . Arthritis   . Former heavy cigarette smoker (20-39 per day) 03/05/2018   Quit 2000; 60 pak year history  . HTN (hypertension)     Past Surgical History:  Procedure Laterality Date  . LEFT HEART CATH AND CORONARY ANGIOGRAPHY N/A 06/15/2017   Procedure: LEFT HEART CATH AND CORONARY ANGIOGRAPHY;  Surgeon: Nelva Bush, MD;  Location: [facility];  Service: Cardiovascular;  Laterality: N/A;  . TONSILLECTOMY    . UMBILICAL HERNIA REPAIR N/A 11/03/2017   Procedure: UMBILICAL HERNIA REPAIR;  Surgeon: Clovis Riley, MD;  Location: [facility];  Service: General;  Laterality: N/A;    Current  Outpatient Medications on File Prior to Visit  Medication Sig Dispense Refill  . amLODipine (NORVASC) 5 MG tablet Take 1 tablet (5 mg total) by mouth daily. 90 tablet 0  . hydrochlorothiazide (HYDRODIURIL) 25 MG tablet TAKE ONE TABLET BY MOUTH DAILY 90 tablet 3  . ibuprofen (ADVIL) 200 MG tablet Take 200 mg by mouth every 6 (six) hours as needed.     No current facility-administered medications on file prior to visit.     Allergies  Allergen Reactions  . Other Itching and Other (See Comments)    Opioids - "scratch my face off"     Family History  Problem Relation Age of Onset  . Dementia Mother        alcohol  . Alcohol abuse Mother   . Heart attack Father   . Heart disease Father   . COPD Father   . CAD Brother   . Healthy Daughter   . Healthy Son   . Healthy Son     Social History   Socioeconomic History  . Marital status: Married    Spouse name: Not on file  . Number of children: 3  . Years of education: Not on file  . Highest education level: Not on file  Occupational History  . Occupation: disabled  Social Needs  . Financial resource strain: Not on file  . Food insecurity    Worry: Not on file    Inability: Not on file  . Transportation needs    Medical: Not on file    Non-medical: Not on file  Tobacco Use  . Smoking status: Former Smoker  Packs/day: 2.00    Years: 30.00    Pack years: 60.00    Types: Cigarettes    Quit date: 06/16/1998    Years since quitting: 20.4  . Smokeless tobacco: Never Used  Substance and Sexual Activity  . Alcohol use: No    Comment: sober 08-28-2017  . Drug use: No  . Sexual activity: Yes    Partners: Female  Lifestyle  . Physical activity    Days per week: Not on file    Minutes per session: Not on file  . Stress: Not on file  Relationships  . Social Herbalist on phone: Not on file    Gets together: Not on file    Attends religious service: Not on file    Active member of club or organization: Not on  file    Attends meetings of clubs or organizations: Not on file    Relationship status: Not on file  . Intimate partner violence    Fear of current or ex partner: Not on file    Emotionally abused: Not on file    Physically abused: Not on file    Forced sexual activity: Not on file  Other Topics Concern  . Not on file  Social History Narrative  . Not on file   Review of Systems  Constitutional: Negative for fever and weight loss.  HENT: Positive for hearing loss and tinnitus. Negative for ear discharge and ear pain.   Eyes: Negative for blurred vision, double vision, photophobia and pain.  Respiratory: Negative for cough and shortness of breath.   Cardiovascular: Negative for chest pain and palpitations.  Gastrointestinal: Negative for abdominal pain, blood in stool, constipation, diarrhea, heartburn, melena, nausea and vomiting.  Genitourinary: Negative for dysuria, flank pain, frequency, hematuria and urgency.  Musculoskeletal: Negative for falls.  Neurological: Negative for dizziness, loss of consciousness and headaches.  Endo/Heme/Allergies: Negative for environmental allergies.  Psychiatric/Behavioral: Negative for depression, hallucinations, substance abuse and suicidal ideas. The patient is not nervous/anxious and does not have insomnia.    BP 134/68   Pulse 77   Temp 98.8 F (37.1 C) (Skin)   Resp 16   Ht 5\' 6"  (1.676 m)   Wt 245 lb 9.6 oz (111.4 kg)   SpO2 98%   BMI 39.64 kg/m   Physical Exam Vitals signs reviewed.  Constitutional:      General: He is not in acute distress.    Appearance: He is well-developed. He is not diaphoretic.  HENT:     Head: Normocephalic and atraumatic.     Right Ear: Tympanic membrane, ear canal and external ear normal.     Left Ear: Tympanic membrane, ear canal and external ear normal.     Nose: Nose normal.     Mouth/Throat:     Pharynx: No posterior oropharyngeal erythema.  Eyes:     Conjunctiva/sclera: Conjunctivae normal.      Pupils: Pupils are equal, round, and reactive to light.  Neck:     Musculoskeletal: Neck supple.     Thyroid: No thyromegaly.  Cardiovascular:     Rate and Rhythm: Normal rate and regular rhythm.     Heart sounds: Normal heart sounds.  Pulmonary:     Effort: Pulmonary effort is normal. No respiratory distress.     Breath sounds: Normal breath sounds. No wheezing or rales.  Chest:     Chest wall: No tenderness.  Abdominal:     General: Bowel sounds are normal. There is no  distension.     Palpations: Abdomen is soft. There is no mass.     Tenderness: There is no abdominal tenderness. There is no guarding or rebound.  Lymphadenopathy:     Cervical: No cervical adenopathy.  Skin:    General: Skin is warm and dry.     Findings: No rash.  Neurological:     Mental Status: He is alert and oriented to person, place, and time.     Cranial Nerves: No cranial nerve deficit.    Assessment/Plan: 1. Visit for preventive health examination Depression screen negative. Health Maintenance reviewed. Preventive schedule discussed and handout given in AVS. Will obtain fasting labs today.  - Hemoglobin A1c - Lipid panel - CBC with Differential/Platelet - Comprehensive metabolic panel  2. Prostate cancer screening The natural history of prostate cancer and ongoing controversy regarding screening and potential treatment outcomes of prostate cancer has been discussed with the patient. The meaning of a false positive PSA and a false negative PSA has been discussed. He indicates understanding of the limitations of this screening test and wishes to proceed with screening PSA testing.  - PSA, Medicare  3. Essential hypertension BP normotensive. Asymptomatic. Continue current regimen. Repeat labs today.  4. Primary osteoarthritis of both knees End-stage. Followed by [facility] Orth[facility]. Continue care as directed by specialist.  5. Lumbar herniated disc AM symptoms significant due to hard  mattress. Agree that an orthopedic mattress would offer relief and that his current diagnoses warrant a prescription for this. Will contact [employer] so that the appropriate forms can be completed.   Leeanne Rio, PA-C

## 2018-12-06 ENCOUNTER — Encounter: Payer: Self-pay | Admitting: Physician Assistant

## 2018-12-06 ENCOUNTER — Ambulatory Visit (INDEPENDENT_AMBULATORY_CARE_PROVIDER_SITE_OTHER): Payer: Medicare HMO | Admitting: Physician Assistant

## 2018-12-06 ENCOUNTER — Other Ambulatory Visit: Payer: Self-pay

## 2018-12-06 VITALS — BP 134/68 | HR 77 | Temp 98.8°F | Resp 16 | Ht 66.0 in | Wt 245.6 lb

## 2018-12-06 DIAGNOSIS — Z125 Encounter for screening for malignant neoplasm of prostate: Secondary | ICD-10-CM | POA: Diagnosis not present

## 2018-12-06 DIAGNOSIS — I10 Essential (primary) hypertension: Secondary | ICD-10-CM | POA: Diagnosis not present

## 2018-12-06 DIAGNOSIS — M170 Bilateral primary osteoarthritis of knee: Secondary | ICD-10-CM

## 2018-12-06 DIAGNOSIS — M5126 Other intervertebral disc displacement, lumbar region: Secondary | ICD-10-CM | POA: Diagnosis not present

## 2018-12-06 DIAGNOSIS — Z0000 Encounter for general adult medical examination without abnormal findings: Secondary | ICD-10-CM | POA: Diagnosis not present

## 2018-12-06 LAB — CBC WITH DIFFERENTIAL/PLATELET
Basophils Absolute: 0.1 10*3/uL (ref 0.0–0.1)
Basophils Relative: 0.9 % (ref 0.0–3.0)
Eosinophils Absolute: 0.3 10*3/uL (ref 0.0–0.7)
Eosinophils Relative: 3.2 % (ref 0.0–5.0)
HCT: 43.9 % (ref 39.0–52.0)
Hemoglobin: 14.9 g/dL (ref 13.0–17.0)
Lymphocytes Relative: 32.4 % (ref 12.0–46.0)
Lymphs Abs: 2.8 10*3/uL (ref 0.7–4.0)
MCHC: 34 g/dL (ref 30.0–36.0)
MCV: 88.2 fl (ref 78.0–100.0)
Monocytes Absolute: 0.9 10*3/uL (ref 0.1–1.0)
Monocytes Relative: 10.7 % (ref 3.0–12.0)
Neutro Abs: 4.5 10*3/uL (ref 1.4–7.7)
Neutrophils Relative %: 52.8 % (ref 43.0–77.0)
Platelets: 206 10*3/uL (ref 150.0–400.0)
RBC: 4.98 Mil/uL (ref 4.22–5.81)
RDW: 13.3 % (ref 11.5–15.5)
WBC: 8.5 10*3/uL (ref 4.0–10.5)

## 2018-12-06 LAB — COMPREHENSIVE METABOLIC PANEL
ALT: 19 U/L (ref 0–53)
AST: 19 U/L (ref 0–37)
Albumin: 4.4 g/dL (ref 3.5–5.2)
Alkaline Phosphatase: 63 U/L (ref 39–117)
BUN: 18 mg/dL (ref 6–23)
CO2: 28 mEq/L (ref 19–32)
Calcium: 9.1 mg/dL (ref 8.4–10.5)
Chloride: 102 mEq/L (ref 96–112)
Creatinine, Ser: 0.92 mg/dL (ref 0.40–1.50)
GFR: 82.63 mL/min (ref >60.00)
Glucose, Bld: 88 mg/dL (ref 70–99)
Potassium: 4 mEq/L (ref 3.5–5.1)
Sodium: 138 mEq/L (ref 135–145)
Total Bilirubin: 0.5 mg/dL (ref 0.2–1.2)
Total Protein: 6.4 g/dL (ref 6.0–8.3)

## 2018-12-06 LAB — LIPID PANEL
Cholesterol: 172 mg/dL (ref 0–200)
HDL: 45.2 mg/dL (ref >39.00)
LDL Cholesterol: 111 mg/dL — ABNORMAL HIGH (ref 0–99)
NonHDL: 127.11
Total CHOL/HDL Ratio: 4
Triglycerides: 83 mg/dL (ref 0.0–149.0)
VLDL: 16.6 mg/dL (ref 0.0–40.0)

## 2018-12-06 LAB — PSA, MEDICARE: PSA: 0.73 ng/ml (ref 0.10–4.00)

## 2018-12-06 LAB — HEMOGLOBIN A1C: Hgb A1c MFr Bld: 5.8 % (ref 4.6–6.5)

## 2018-12-06 NOTE — Patient Instructions (Signed)
Please go to the lab today for blood work.  I will call you with your results. We will alter treatment regimen(s) if indicated by your results.   I will let you know as soon as I have completed/submitted forms for the mattress.   Preventive Care 40-64 Years, Male Preventive care refers to lifestyle choices and visits with your health care provider that can promote health and wellness. What does preventive care include?   A yearly physical exam. This is also called an annual well check.  Dental exams once or twice a year.  Routine eye exams. Ask your health care provider how often you should have your eyes checked.  Personal lifestyle choices, including: ? Daily care of your teeth and gums. ? Regular physical activity. ? Eating a healthy diet. ? Avoiding tobacco and drug use. ? Limiting alcohol use. ? Practicing safe sex. ? Taking low-dose aspirin every day starting at age 65. What happens during an annual well check? The services and screenings done by your health care provider during your annual well check will depend on your age, overall health, lifestyle risk factors, and family history of disease. Counseling Your health care provider may ask you questions about your:  Alcohol use.  Tobacco use.  Drug use.  Emotional well-being.  Home and relationship well-being.  Sexual activity.  Eating habits.  Work and work Statistician. Screening You may have the following tests or measurements:  Height, weight, and BMI.  Blood pressure.  Lipid and cholesterol levels. These may be checked every 5 years, or more frequently if you are over 65 years old.  Skin check.  Lung cancer screening. You may have this screening every year starting at age 65 if you have a 30-pack-year history of smoking and currently smoke or have quit within the past 15 years.  Colorectal cancer screening. All adults should have this screening starting at age 65 and continuing until age 40. Your  health care provider may recommend screening at age 65. You will have tests every 1-10 years, depending on your results and the type of screening test. People at increased risk should start screening at an earlier age. Screening tests may include: ? Guaiac-based fecal occult blood testing. ? Fecal immunochemical test (FIT). ? Stool DNA test. ? Virtual colonoscopy. ? Sigmoidoscopy. During this test, a flexible tube with a tiny camera (sigmoidoscope) is used to examine your rectum and lower colon. The sigmoidoscope is inserted through your anus into your rectum and lower colon. ? Colonoscopy. During this test, a long, thin, flexible tube with a tiny camera (colonoscope) is used to examine your entire colon and rectum.  Prostate cancer screening. Recommendations will vary depending on your family history and other risks.  Hepatitis C blood test.  Hepatitis B blood test.  Sexually transmitted disease (STD) testing.  Diabetes screening. This is done by checking your blood sugar (glucose) after you have not eaten for a while (fasting). You may have this done every 1-3 years. Discuss your test results, treatment options, and if necessary, the need for more tests with your health care provider. Vaccines Your health care provider may recommend certain vaccines, such as:  Influenza vaccine. This is recommended every year.  Tetanus, diphtheria, and acellular pertussis (Tdap, Td) vaccine. You may need a Td booster every 10 years.  Varicella vaccine. You may need this if you have not been vaccinated.  Zoster vaccine. You may need this after age 65.  Measles, mumps, and rubella (MMR) vaccine. You may need  at least one dose of MMR if you were born in 1957 or later. You may also need a second dose.  Pneumococcal 13-valent conjugate (PCV13) vaccine. You may need this if you have certain conditions and have not been vaccinated.  Pneumococcal polysaccharide (PPSV23) vaccine. You may need one or two  doses if you smoke cigarettes or if you have certain conditions.  Meningococcal vaccine. You may need this if you have certain conditions.  Hepatitis A vaccine. You may need this if you have certain conditions or if you travel or work in places where you may be exposed to hepatitis A.  Hepatitis B vaccine. You may need this if you have certain conditions or if you travel or work in places where you may be exposed to hepatitis B.  Haemophilus influenzae type b (Hib) vaccine. You may need this if you have certain risk factors. Talk to your health care provider about which screenings and vaccines you need and how often you need them. This information is not intended to replace advice given to you by your health care provider. Make sure you discuss any questions you have with your health care provider. Document Released: 06/29/2015 Document Revised: 07/23/2017 Document Reviewed: 04/03/2015 Elsevier Interactive Patient Education  2019 [employer].

## 2018-12-07 ENCOUNTER — Telehealth: Payer: Self-pay | Admitting: Physician Assistant

## 2018-12-07 NOTE — Telephone Encounter (Signed)
Pt called in stating that he needs a PA from is insurance in order to get an orthopedic mattress. He would like this done asap. Pt can be reached at the home #

## 2018-12-07 NOTE — Telephone Encounter (Signed)
Will check patient insurance website for directions and information for the orthopedic mattress.

## 2018-12-09 ENCOUNTER — Encounter: Payer: Self-pay | Admitting: Physician Assistant

## 2018-12-09 NOTE — Telephone Encounter (Signed)
Spoke with patient about getting the number/website to contact insurance co for pre-certification of the orthopedic mattress. Spoke with Malachy Mood at [facility] at [phone]. ICD-10 dx codes given but they need a HCPCS code for the orthopedic mattress.  LMOVM advising patient to call us back with the preferred orthopedic mattress patient is wanting.

## 2018-12-09 NOTE — Telephone Encounter (Signed)
I have completed a letter to serve as a prescription for orthopedic mattress. The patient needs to find a mattress he is interested in purchasing and we can either fax the prescription there or he can come pick up and take with him.

## 2018-12-10 NOTE — Telephone Encounter (Signed)
Patient returned call, informed of message below. Patient will call back with specifics regarding what type of orthopedic mattress he prefers. Best # [phone]

## 2018-12-13 NOTE — Telephone Encounter (Signed)
FYI

## 2018-12-15 ENCOUNTER — Other Ambulatory Visit: Payer: Self-pay | Admitting: Physician Assistant

## 2018-12-15 DIAGNOSIS — I10 Essential (primary) hypertension: Secondary | ICD-10-CM

## 2018-12-16 MED ORDER — AMLODIPINE BESYLATE 5 MG PO TABS: 5.0000 mg | ORAL_TABLET | Freq: Every day | ORAL | 1 refills | Status: DC

## 2018-12-30 ENCOUNTER — Telehealth: Payer: Self-pay | Admitting: Physician Assistant

## 2018-12-30 NOTE — Telephone Encounter (Signed)
Patient called back and wanted to know why we would not fill this out. He states that he does not have an orthopedic doctor that he sees regularly, he was just told to come back to them once he was ready for his knee replacement.  Patient states that he has been on [place] for 3 years and his PCP has always signed off on the paperwork and he gets a discount on his property tax.  He is asking why this is an issue now when it has happened for the last 3 years.  Advised that I would route message to provider but I could not guarantee that this would happen.

## 2018-12-30 NOTE — Telephone Encounter (Signed)
I have placed a tax exclusion form with a charge sheet in the bin upfront.

## 2018-12-30 NOTE — Telephone Encounter (Signed)
I have reviewed form. Will need to have completed by his Orthopedics who is seeing him for arthritic conditions as this would be the only potential contributor to consideration of disability.Forms placed back up front for patient to pick up.

## 2018-12-30 NOTE — Telephone Encounter (Signed)
LM on [phone] making pt aware that he would need to take the form to his Orthopedic to have them complete the disability form./

## 2018-12-30 NOTE — Telephone Encounter (Signed)
Cannot see where this was ever completed by Dr. Jonni Sanger in the chart. What is the diagnosis for cause of his disability? I need to know so this is documented. I have never seen him have significant issue with ambulation or ROM in office.

## 2018-12-31 NOTE — Telephone Encounter (Signed)
I have left a message asking patient to call to give [place] any information about the disability.  Advised patient that we don't see where the previous PCP's have done this, so we need more information.

## 2019-02-02 ENCOUNTER — Encounter: Payer: Self-pay | Admitting: *Deleted

## 2019-02-11 ENCOUNTER — Other Ambulatory Visit: Payer: Self-pay

## 2019-02-11 DIAGNOSIS — Z20822 Contact with and (suspected) exposure to covid-19: Secondary | ICD-10-CM

## 2019-02-11 DIAGNOSIS — R6889 Other general symptoms and signs: Secondary | ICD-10-CM | POA: Diagnosis not present

## 2019-02-11 DIAGNOSIS — R69 Illness, unspecified: Secondary | ICD-10-CM | POA: Diagnosis not present

## 2019-02-11 NOTE — Progress Notes (Signed)
[id number] 

## 2019-02-12 LAB — NOVEL CORONAVIRUS, NAA: SARS-CoV-2, NAA: NOT DETECTED

## 2019-02-15 ENCOUNTER — Other Ambulatory Visit: Payer: Self-pay

## 2019-02-15 ENCOUNTER — Ambulatory Visit (INDEPENDENT_AMBULATORY_CARE_PROVIDER_SITE_OTHER): Payer: Medicare HMO | Admitting: Physician Assistant

## 2019-02-15 ENCOUNTER — Encounter: Payer: Self-pay | Admitting: Physician Assistant

## 2019-02-15 VITALS — BP 126/60 | HR 60 | Temp 98.6°F | Ht 66.0 in | Wt 245.0 lb

## 2019-02-15 DIAGNOSIS — M5440 Lumbago with sciatica, unspecified side: Secondary | ICD-10-CM | POA: Diagnosis not present

## 2019-02-15 MED ORDER — METHYLPREDNISOLONE 4 MG PO TBPK: ORAL_TABLET | ORAL | 0 refills | Status: DC

## 2019-02-15 MED ORDER — CYCLOBENZAPRINE HCL 5 MG PO TABS: 5.0000 mg | ORAL_TABLET | Freq: Three times a day (TID) | ORAL | 1 refills | Status: DC | PRN

## 2019-02-15 NOTE — Patient Instructions (Addendum)
Instructions sent to MyChart.   Take the Medrol dose pack as directed. Use tylenol for breakthrough pain. Use the Flexeril/Cyclobenazprine (Muscle relaxant) in the evening. You can use up to three times daily but no driving or operating machinery while taking this medication. Continue heating pad. No heavy lifting! Start the stretches below as things are improving.  Follow-up if symptoms are not resolving or you note any new or worsening symptoms   Sciatica Rehab Ask your health care provider which exercises are safe for you. Do exercises exactly as told by your health care provider and adjust them as directed. It is normal to feel mild stretching, pulling, tightness, or discomfort as you do these exercises. Stop right away if you feel sudden pain or your pain gets worse. Do not begin these exercises until told by your health care provider. Stretching and range-of-motion exercises These exercises warm up your muscles and joints and improve the movement and flexibility of your hips and back. These exercises also help to relieve pain, numbness, and tingling. Sciatic nerve glide 1. Sit in a chair with your head facing down toward your chest. Place your hands behind your back. Let your shoulders slump forward. 2. Slowly straighten one of your legs while you tilt your head back as if you are looking toward the ceiling. Only straighten your leg as far as you can without making your symptoms worse. 3. Hold this position for __10__ seconds. 4. Slowly return to the starting position. 5. Repeat with your other leg. Repeat ____5____ times. Complete this exercise ___2_____ times a day. Knee to chest with hip adduction and internal rotation  1. Lie on your back on a firm surface with both legs straight. 2. Bend one of your knees and move it up toward your chest until you feel a gentle stretch in your lower back and buttock. Then, move your knee toward the shoulder that is on the opposite side from your  leg. This is hip adduction and internal rotation. ? Hold your leg in this position by holding on to the front of your knee. 3. Hold this position for ___10____ seconds. 4. Slowly return to the starting position. 5. Repeat with your other leg. Repeat ____5______ times. Complete this exercise ____2______ times a day. Prone extension on elbows  1. Lie on your abdomen on a firm surface. A bed may be too soft for this exercise. 2. Prop yourself up on your elbows. 3. Use your arms to help lift your chest up until you feel a gentle stretch in your abdomen and your lower back. ? This will place some of your body weight on your elbows. If this is uncomfortable, try stacking pillows under your chest. ? Your hips should stay down, against the surface that you are lying on. Keep your hip and back muscles relaxed. 4. Hold this position for ____10______ seconds. 5. Slowly relax your upper body and return to the starting position. Repeat ____5______ times. Complete this exercise ___2_______ times a day. Strengthening exercises These exercises build strength and endurance in your back. Endurance is the ability to use your muscles for a long time, even after they get tired. Pelvic tilt This exercise strengthens the muscles that lie deep in the abdomen. 1. Lie on your back on a firm surface. Bend your knees and keep your feet flat on the floor. 2. Tense your abdominal muscles. Tip your pelvis up toward the ceiling and flatten your lower back into the floor. ? To help with this exercise, you  may place a small towel under your lower back and try to push your back into the towel. 3. Hold this position for ___10_______ seconds. 4. Let your muscles relax completely before you repeat this exercise. Repeat ___5_______ times. Complete this exercise ____2______ times a day. Alternating arm and leg raises  1. Get on your hands and knees on a firm surface. If you are on a hard floor, you may want to use padding, such  as an exercise mat, to cushion your knees. 2. Line up your arms and legs. Your hands should be directly below your shoulders, and your knees should be directly below your hips. 3. Lift your left leg behind you. At the same time, raise your right arm and straighten it in front of you. ? Do not lift your leg higher than your hip. ? Do not lift your arm higher than your shoulder. ? Keep your abdominal and back muscles tight. ? Keep your hips facing the ground. ? Do not arch your back. ? Keep your balance carefully, and do not hold your breath. 4. Hold this position for ___10_______ seconds. 5. Slowly return to the starting position. 6. Repeat with your right leg and your left arm. Repeat ___5_____ times. Complete this exercise ____2______ times a day. Posture and body mechanics Good posture and healthy body mechanics can help to relieve stress in your body's tissues and joints. Body mechanics refers to the movements and positions of your body while you do your daily activities. Posture is part of body mechanics. Good posture means:  Your spine is in its natural S-curve position (neutral).  Your shoulders are pulled back slightly.  Your head is not tipped forward. Follow these guidelines to improve your posture and body mechanics in your everyday activities. Standing   When standing, keep your spine neutral and your feet about hip width apart. Keep a slight bend in your knees. Your ears, shoulders, and hips should line up.  When you do a task in which you stand in one place for a long time, place one foot up on a stable object that is 2-4 inches (5-10 cm) high, such as a footstool. This helps keep your spine neutral. Sitting   When sitting, keep your spine neutral and keep your feet flat on the floor. Use a footrest, if necessary, and keep your thighs parallel to the floor. Avoid rounding your shoulders, and avoid tilting your head forward.  When working at a desk or a computer, keep  your desk at a height where your hands are slightly lower than your elbows. Slide your chair under your desk so you are close enough to maintain good posture.  When working at a computer, place your monitor at a height where you are looking straight ahead and you do not have to tilt your head forward or downward to look at the screen. Resting  When lying down and resting, avoid positions that are most painful for you.  If you have pain with activities such as sitting, bending, stooping, or squatting, lie in a position in which your body does not bend very much. For example, avoid curling up on your side with your arms and knees near your chest (fetal position).  If you have pain with activities such as standing for a long time or reaching with your arms, lie with your spine in a neutral position and bend your knees slightly. Try the following positions: ? Lying on your side with a pillow between your knees. ? Lying  on your back with a pillow under your knees. Lifting   When lifting objects, keep your feet at least shoulder width apart and tighten your abdominal muscles.  Bend your knees and hips and keep your spine neutral. It is important to lift using the strength of your legs, not your back. Do not lock your knees straight out.  Always ask for help to lift heavy or awkward objects. This information is not intended to replace advice given to you by your health care provider. Make sure you discuss any questions you have with your health care provider. Document Released: 06/02/2005 Document Revised: 09/24/2018 Document Reviewed: 06/24/2018 Elsevier Patient Education  2020 [employer].

## 2019-02-15 NOTE — Progress Notes (Signed)
I have discussed the procedure for the virtual visit with the patient who has given consent to proceed with assessment and treatment.   Patina S Moore, [name]     

## 2019-02-15 NOTE — Progress Notes (Signed)
Virtual Visit via Video   I connected with patient on 02/15/19 at  1:30 PM EDT by a video enabled telemedicine application and verified that I am speaking with the correct person using two identifiers.  Location patient: Home Location provider: [facility], Office Persons participating in the virtual visit: Patient, Provider, [place] (Patina Moore)  I discussed the limitations of evaluation and management by telemedicine and the availability of in person appointments. The patient expressed understanding and agreed to proceed.  Subjective:   HPI:   Patient presents via [facility] today c/o flare up of his low back pain, bilaterally (alternating) x 2 weeks. Patient endorses symptoms were initially very subtle but have worsened over the past week. Notes pain averaging about 4-5/10, 6-7 at its worst. Was doing heavy lifting this weekend despite pain with worsening of symptoms. Denies numbness or tingling in the legs. Denies saddle aensthesia. Denies change to bowel of bladder habits. Has taken Advil Back and Body with only some relief in symptoms.   ROS:   See pertinent positives and negatives per HPI.  Patient Active Problem List   Diagnosis Date Noted  . Unilateral primary osteoarthritis, left knee 06/22/2018  . Unilateral primary osteoarthritis, right knee 06/22/2018  . Screening for colorectal cancer 05/17/2018  . Nonobstructive atherosclerosis of coronary artery 03/05/2018  . Obesity (BMI 30-39.9) 03/05/2018  . Former heavy cigarette smoker (20-39 per day) 03/05/2018  . Varicose veins of bilateral lower extremities with pain 12/28/2017  . Umbilical hernia [id number]  . Alcohol abuse, in remission 09/17/2017  . Osteoarthritis of knee 08/10/2017  . Rosacea 06/11/2017  . Subclinical hypothyroidism 06/05/2017  . Essential hypertension 05/27/2017  . Bilateral hearing loss 05/04/2014    Social History   Tobacco Use  . Smoking status: Former Smoker    Packs/day: 2.00   Years: 30.00    Pack years: 60.00    Types: Cigarettes    Quit date: 06/16/1998    Years since quitting: 20.6  . Smokeless tobacco: Never Used  Substance Use Topics  . Alcohol use: No    Comment: sober 08-28-2017    Current Outpatient Medications:  .  amLODipine (NORVASC) 5 MG tablet, Take 1 tablet (5 mg total) by mouth daily., Disp: 90 tablet, Rfl: 1 .  hydrochlorothiazide (HYDRODIURIL) 25 MG tablet, TAKE ONE TABLET BY MOUTH DAILY, Disp: 90 tablet, Rfl: 3 .  ibuprofen (ADVIL) 200 MG tablet, Take 200 mg by mouth every 6 (six) hours as needed., Disp: , Rfl:   Allergies  Allergen Reactions  . Other Itching and Other (See Comments)    Opioids - "scratch my face off"     Objective:   BP 126/60   Temp 98.6 F (37 C) (Skin)   Wt 245 lb (111.1 kg)   BMI 39.54 kg/m   Patient is well-developed, well-nourished in no acute distress.  Resting comfortably at home.  Head is normocephalic, atraumatic.  No labored breathing.  Speech is clear and coherent with logical content.  Patient is alert and oriented at baseline.   Assessment and Plan:   1. Acute bilateral low back pain with sciatica, sciatica laterality unspecified Atraumatic but with heavy lifting. No alarm signs/symptoms. Advised to limit heavy lifting. Start Medrol dose pack and Flexeril. Tylenol for breakthrough pain. Supportive measures reviewed. Handout sent to MyChart. Strict return precautions reviewed with patient.  - methylPREDNISolone (MEDROL DOSEPAK) 4 MG TBPK tablet; Take following package directions.  Dispense: 21 tablet; Refill: 0 - cyclobenzaprine (FLEXERIL) 5 MG tablet; Take 1  tablet (5 mg total) by mouth 3 (three) times daily as needed for muscle spasms.  Dispense: 30 tablet; Refill: [place], [place] 02/15/2019

## 2019-04-21 ENCOUNTER — Other Ambulatory Visit: Payer: Self-pay

## 2019-04-21 DIAGNOSIS — Z20822 Contact with and (suspected) exposure to covid-19: Secondary | ICD-10-CM

## 2019-04-22 LAB — NOVEL CORONAVIRUS, NAA: SARS-CoV-2, NAA: NOT DETECTED

## 2019-05-02 ENCOUNTER — Ambulatory Visit (INDEPENDENT_AMBULATORY_CARE_PROVIDER_SITE_OTHER): Payer: Medicare HMO | Admitting: Family Medicine

## 2019-05-02 ENCOUNTER — Other Ambulatory Visit: Payer: Self-pay

## 2019-05-02 ENCOUNTER — Encounter: Payer: Self-pay | Admitting: Family Medicine

## 2019-05-02 ENCOUNTER — Ambulatory Visit (INDEPENDENT_AMBULATORY_CARE_PROVIDER_SITE_OTHER): Payer: Medicare HMO

## 2019-05-02 VITALS — BP 104/68 | HR 72 | Ht 66.0 in | Wt 252.2 lb

## 2019-05-02 DIAGNOSIS — M5440 Lumbago with sciatica, unspecified side: Secondary | ICD-10-CM

## 2019-05-02 DIAGNOSIS — M545 Low back pain: Secondary | ICD-10-CM | POA: Diagnosis not present

## 2019-05-02 NOTE — Progress Notes (Signed)
Subjective:    CC: Low back pain  I, Molly Weber, LAT, ATC, am serving as scribe for Dr. Lynne Leader.  HPI: Pt is a 63 y/o male presenting w/ c/o low back pain.  He was last seen on 02/15/19 by his PCP for bilateral low back pain and was prescribed a medrol dose pack and Flexeril 5 mg.  He was also given a HEP for sciatica.  Since then, pt rates his pain as an aching/throbbing 7/10 but reports 10/10 over the weekend w/ no known new MOI.  Pain is predominantly located in the bilateral low back.  He also notes radicular pain into his B LEs.  The radicular pain is insignificant.  He's tried a steroid dose pack, Flexeril, lidocaine patches.  Pt denies any numbness/tingling into his B LEs.  He reports all activities are aggravating, including trying to sleep at night.  Past medical history, Surgical history, Family history not pertinant except as noted below, Social history, Allergies, and medications have been entered into the medical record, reviewed, and no changes needed.   Review of Systems: No headache, visual changes, nausea, vomiting, diarrhea, constipation, dizziness, abdominal pain, skin rash, fevers, chills, night sweats, weight loss, swollen lymph nodes, body aches, joint swelling, muscle aches, chest pain, shortness of breath, mood changes, visual or auditory hallucinations.   Objective:    Vitals:   05/02/19 1243  BP: 104/68  Pulse: 72  SpO2: 98%   General: Well Developed, well nourished, and in no acute distress.  Neuro/Psych: Alert and oriented x3, extra-ocular muscles intact, able to move all 4 extremities, sensation grossly intact. Skin: Warm and dry, no rashes noted.  Respiratory: Not using accessory muscles, speaking in full sentences, trachea midline.  Cardiovascular: Pulses palpable, no extremity edema. Abdomen: Does not appear distended. MSK:  Pain: Nontender to spinal midline.  Not particularly tender to palpation bilateral lumbar paraspinal musculature. Lumbar  motion normal rotation and lateral flexion.  Decreased extension.  Normal flexion which does relieve his pain a bit. Lower extremity strength reflexes and sensation equal and normal throughout. Negative straight leg raise test bilaterally. Mild antalgic gait.  Lab and Radiology Results X-ray images L-spine obtained today personally and independently reviewed. DDD at L2-3.  Facet DJD and mild neuroforaminal stenosis at L5-S1.  No fractures or significant malalignment. Await formal radiology review  Interface, Imaging Results In - 04/07/2014 12:22 PM EDT Exam: Lumbar spine MRI unenhanced  History:  Left-sided thoracic and low back pain x3-4 months.  Comparison:  Lumbar spine radiographic series 03/24/2014.  Technique: Unenhanced sagittal, axial and coronal sequences were obtained.  Findings: Vertebral bodies are well maintained. Alignment is anatomic. Congenitally narrow lumbar spinal canal below the L2 level due to short pedicles. Marrow signal is normal. Cauda equina and visualized distal spinal cord is unremarkable.  T11-12 and T12-L1: Normal.  L1-2: Normal.  L2-3: Minimal annular disc bulge without disc protrusion or canal stenosis.  L3-4: Mild diffuse annular disc bulge and mild facet degenerative changes without disc protrusion, nerve root compression or canal stenosis.  L4-5: Mild annular disc bulge and small focal right paracentral disc protrusion focally indenting the right ventral thecal sac. Moderate facet degenerative changes  contribute to mild canal stenosis.  L5-S1: Mild central annular disc bulge without nerve root compression. Moderate facet degenerative changes without canal stenosis.  Impression: 1. Congenitally narrow lumbar spinal canal below the L2 level. 2. At L4-5, small focal right paracentral disc protrusion focally indenting the right ventral thecal sac. Moderate  facet degenerative changes contribute to mild canal stenosis.  Impression and Recommendations:     Assessment and Plan: 63 y.o. male with  Low back pain.  Worsening over the last several months failing medical provider directed conservative management as directed by his PCP since September first. Plan for trial of physical therapy, TENS unit.  Continue prescription strength ibuprofen and cyclobenzaprine.  Recheck back in 1 month.  If not improved next step would be L-spine MRI for epidural steroid or facet injection planning.   PDMP not reviewed this encounter. Orders Placed This Encounter  Procedures  . DG Lumbar Spine Complete    Standing Status:   Future    Number of Occurrences:   1    Standing Expiration Date:   07/01/2020    Order Specific Question:   Reason for Exam (SYMPTOM  OR DIAGNOSIS REQUIRED)    Answer:   eval acute on chronic back pain    Order Specific Question:   Preferred imaging location?    Answer:   [facility]    Order Specific Question:   Radiology Contrast Protocol - do NOT remove file path    Answer:   [email or web address]  . Ambulatory referral to Physical Therapy    Referral Priority:   Routine    Referral Type:   Physical Medicine    Referral Reason:   Specialty Services Required    Requested Specialty:   Physical Therapy    Number of Visits Requested:   1   No orders of the defined types were placed in this encounter.   Discussed warning signs or symptoms. Please see discharge instructions. Patient expresses understanding.   The above documentation has been reviewed and is accurate and complete Lynne Leader

## 2019-05-02 NOTE — Patient Instructions (Addendum)
Thank you for coming in today.  You should hear about PT scheduling soon.  I will get the xray results to you likely tomorrow.   Plan to recheck in about 1 month unless worse or all better.  Come back or go to the emergency room if you notice new weakness new numbness problems walking or bowel or bladder problems.  TENS UNIT: This is helpful for muscle pain and spasm.   Search and Purchase a TENS 7000 2nd edition at  www.tenspros.com or www.Florin.com It should be less than $30.     TENS unit instructions: Do not shower or bathe with the unit on Turn the unit off before removing electrodes or batteries If the electrodes lose stickiness add a drop of water to the electrodes after they are disconnected from the unit and place on plastic sheet. If you continued to have difficulty, call the TENS unit company to purchase more electrodes. Do not apply lotion on the skin area prior to use. Make sure the skin is clean and dry as this will help prolong the life of the electrodes. After use, always check skin for unusual red areas, rash or other skin difficulties. If there are any skin problems, does not apply electrodes to the same area. Never remove the electrodes from the unit by pulling the wires. Do not use the TENS unit or electrodes other than as directed. Do not change electrode placement without consultating your therapist or physician. Keep 2 fingers with between each electrode. Wear time ratio is 2:1, on to off times.    For example on for 30 minutes off for 15 minutes and then on for 30 minutes off for 15 minutes

## 2019-05-03 ENCOUNTER — Other Ambulatory Visit: Payer: Self-pay | Admitting: Physician Assistant

## 2019-05-03 DIAGNOSIS — M545 Low back pain, unspecified: Secondary | ICD-10-CM

## 2019-05-03 NOTE — Progress Notes (Signed)
Mild back arthritis present.  No acute fractures present.  If not improving MRI will be helpful.

## 2019-05-14 IMAGING — DX DG CHEST 2V
2 series · 2 of 2 positions shown · non-contrast
Comparison: None.

CLINICAL DATA: Chronic dry cough for several months

EXAM:
CHEST - 2 VIEW

[dg chest 2 view (1 of 2)]
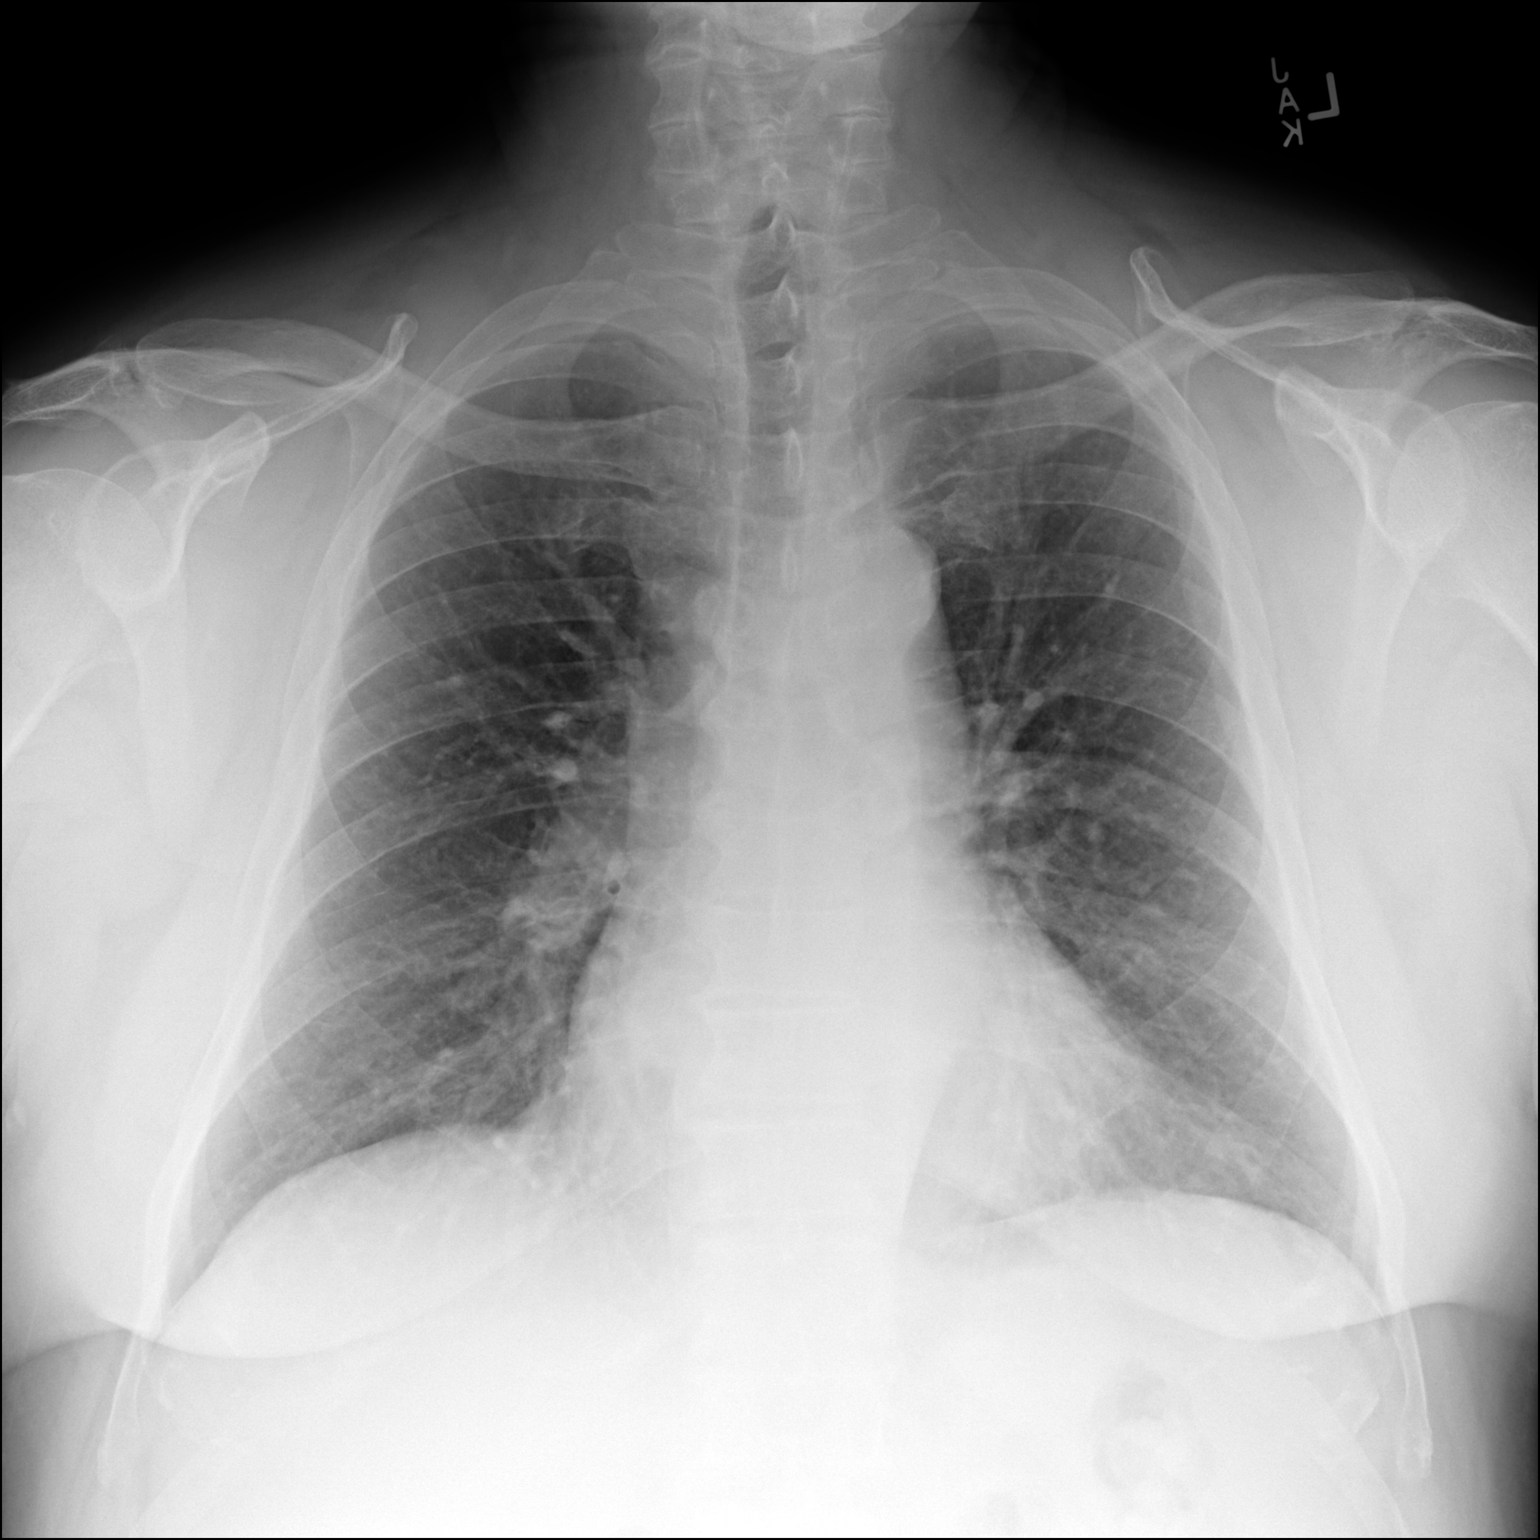

[dg chest 2 view (2 of 2)]
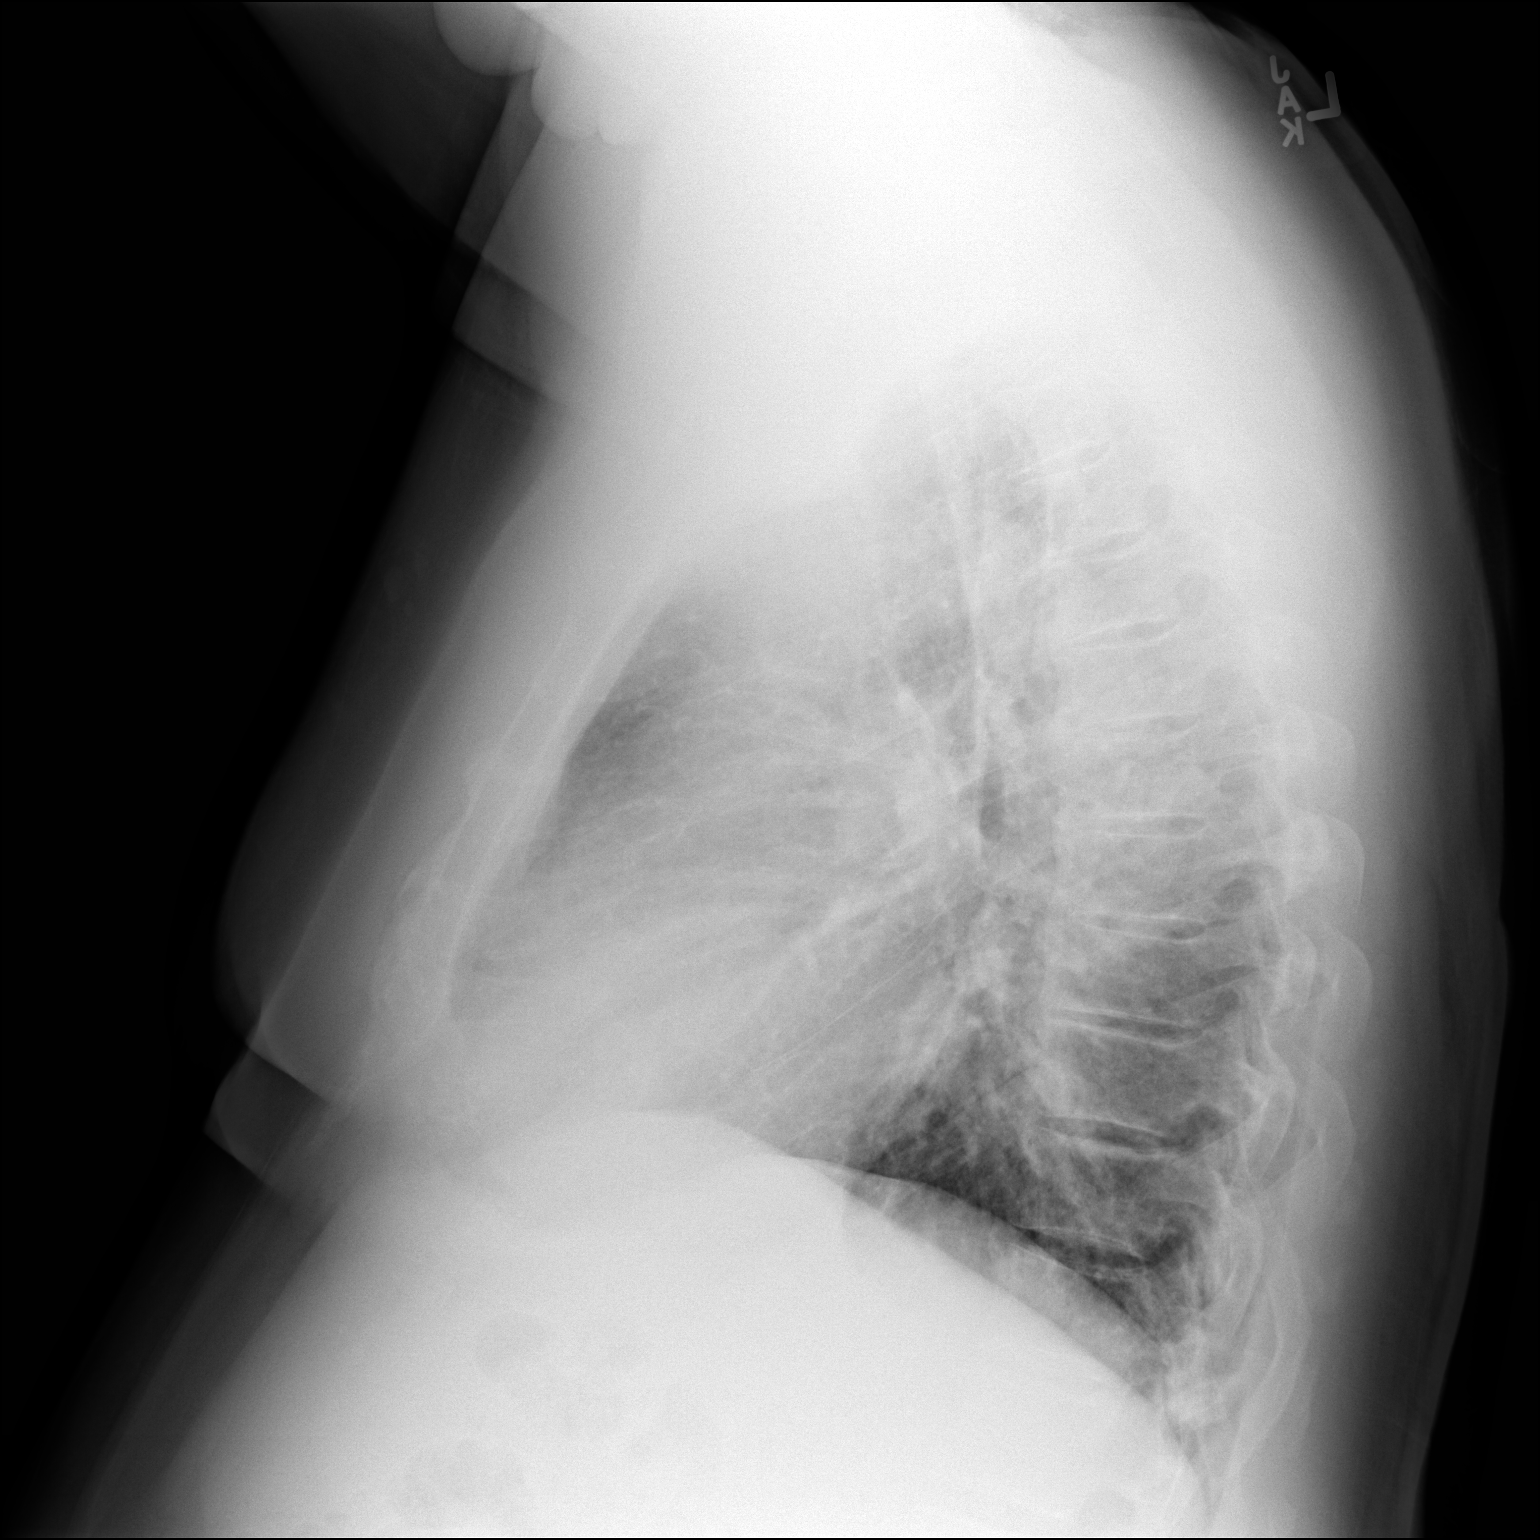

[2 of 2 positions shown; findings below may reference images not displayed]

FINDINGS: The heart size and mediastinal contours are within normal limits.
Both lungs are clear. The visualized skeletal structures are
unremarkable.
IMPRESSION: No active cardiopulmonary disease.

## 2019-05-17 ENCOUNTER — Other Ambulatory Visit: Payer: Self-pay

## 2019-05-17 ENCOUNTER — Ambulatory Visit (HOSPITAL_COMMUNITY): Payer: Medicare HMO

## 2019-05-17 ENCOUNTER — Encounter (HOSPITAL_COMMUNITY): Payer: Self-pay

## 2019-05-18 NOTE — Therapy (Signed)
[facility] [place], [place], [address] Phone: [phone]   Fax:  [phone]  Patient Details  Name: Samuel [name] MRN: [id number] Date of Birth: Feb 11, 1956 Referring Provider:  Gregor Hams, MD  Encounter Date: 05/17/2019  Patient arrived for PT evaluation, but declined services once speaking with receptionist regarding co-pay amount. Patient left without being seen by physical therapist and told receptionist he told his doctor he did not want physical therapy due to co-pay amounts. Educated pt on getting new referral for PT if needs in future arise.   Talbot Grumbling PT, DPT 05/18/19, 10:12 AM [facility] [address] [place], [place], [address] Phone: [phone]   Fax:  [phone]

## 2019-05-25 ENCOUNTER — Telehealth: Payer: Self-pay | Admitting: Physician Assistant

## 2019-05-25 DIAGNOSIS — M545 Low back pain, unspecified: Secondary | ICD-10-CM

## 2019-05-25 NOTE — Telephone Encounter (Signed)
Patient calling requesting a refill of the Flexeril for his back. PCP has never prescribed medication.  PCP states this should come from sports medicine

## 2019-05-25 NOTE — Telephone Encounter (Signed)
Seeing Dr. Georgina Snell for back issues, will need refill from him if he feels appropriate.

## 2019-05-25 NOTE — Telephone Encounter (Signed)
Pt called in asking for a refill on the flexeril, pt uses [facility] on [address]

## 2019-05-26 ENCOUNTER — Telehealth: Payer: Self-pay | Admitting: Orthopaedic Surgery

## 2019-05-26 MED ORDER — CYCLOBENZAPRINE HCL 5 MG PO TABS: 5.0000 mg | ORAL_TABLET | Freq: Three times a day (TID) | ORAL | 2 refills | Status: DC | PRN

## 2019-05-26 NOTE — Telephone Encounter (Signed)
Patient called wanting to get gel injections.  CB#[phone].  Thank you.

## 2019-05-26 NOTE — Telephone Encounter (Signed)
Flexeril prescribed with some refills as well.

## 2019-05-26 NOTE — Telephone Encounter (Signed)
Since it is been over 6 months since his last bilateral knee hyaluronic acid injections, I am fine with this being ordered for him again to treat his osteoarthritis pain.

## 2019-05-26 NOTE — Telephone Encounter (Signed)
Please advise.  Looks like patient was last seen in Feb. 2020 with bilateral gel injections.

## 2019-05-26 NOTE — Telephone Encounter (Signed)
Pt contacted and informed that refill has been sent to his preferred pharmacy.  Pt verbalizes understanding.

## 2019-05-27 NOTE — Telephone Encounter (Signed)
Noted  

## 2019-05-27 NOTE — Telephone Encounter (Signed)
Can we order this for patient please, thank you

## 2019-05-31 ENCOUNTER — Telehealth: Payer: Self-pay

## 2019-05-31 NOTE — Telephone Encounter (Signed)
Patient prefers to submit for gel injections after the first of the year.

## 2019-06-01 DIAGNOSIS — H5203 Hypermetropia, bilateral: Secondary | ICD-10-CM | POA: Diagnosis not present

## 2019-06-01 DIAGNOSIS — H2513 Age-related nuclear cataract, bilateral: Secondary | ICD-10-CM | POA: Diagnosis not present

## 2019-06-15 ENCOUNTER — Other Ambulatory Visit: Payer: Self-pay | Admitting: Family Medicine

## 2019-06-15 NOTE — Telephone Encounter (Signed)
Samuel [name] patient.

## 2019-06-21 ENCOUNTER — Encounter: Payer: Self-pay | Admitting: Family Medicine

## 2019-06-28 ENCOUNTER — Telehealth: Payer: Self-pay

## 2019-06-28 ENCOUNTER — Encounter: Payer: Self-pay | Admitting: Physician Assistant

## 2019-06-28 NOTE — Telephone Encounter (Signed)
Submitted VOB for Gel-One, bilateral knee.  

## 2019-06-30 ENCOUNTER — Telehealth: Payer: Self-pay | Admitting: Orthopaedic Surgery

## 2019-06-30 ENCOUNTER — Telehealth: Payer: Self-pay

## 2019-06-30 NOTE — Telephone Encounter (Signed)
Talked with patient as well.  See previous message in patient's chart.

## 2019-06-30 NOTE — Telephone Encounter (Signed)
Talked with patient concerning gel injection and advised him that an authorization is required through his insurance for gel injection for bilateral knee.  Advised patient that once PA is approved, that he will receive a call to schedule and appointment.

## 2019-06-30 NOTE — Telephone Encounter (Signed)
Returned call to patient. Explained request looks to have been submitted on the 1/12 and we are waiting on approval before scheduling.

## 2019-06-30 NOTE — Telephone Encounter (Signed)
Patient called requesting Dr. Ninfa Linden send referral for gel injections. Patient requesting call back. Patient phone number is [phone]

## 2019-07-04 ENCOUNTER — Telehealth: Payer: Self-pay

## 2019-07-04 NOTE — Telephone Encounter (Signed)
Talked with patient to schedule appointment to have Gel-One, bilateral knee injections, but patient stated that he wanted to have the series of injections.  Advised patient that I was unaware of him wanting to have the series of gel injections, but I would submit for the series.  Also, advised patient that this is going to be a longer process being that he was just approved for just the one injection.  Patient voiced that he understands.  Submitted VOB for Visco-3 series, bilateral knee.

## 2019-07-06 DIAGNOSIS — H5213 Myopia, bilateral: Secondary | ICD-10-CM | POA: Diagnosis not present

## 2019-07-25 ENCOUNTER — Telehealth: Payer: Self-pay

## 2019-07-25 NOTE — Telephone Encounter (Signed)
PA is still pending for Visco-3, currently awaiting approval.

## 2019-08-02 ENCOUNTER — Telehealth: Payer: Self-pay

## 2019-08-02 NOTE — Telephone Encounter (Signed)
Called and scheduled pt's appointments

## 2019-08-02 NOTE — Telephone Encounter (Signed)
Approved for Visco 3- Bilateral knee Dr. Margarito Liner and Bill No Copay 20% OOP Prior auth required Auth # [id number] Dates: 08/01/19-10/29/19

## 2019-08-03 ENCOUNTER — Telehealth: Payer: Self-pay | Admitting: Emergency Medicine

## 2019-08-03 NOTE — Telephone Encounter (Signed)
Called patient and left message on VM. We received order from from [facility] for incontinence supplies (disposable underwear, gloves and disposable underpads

## 2019-08-04 ENCOUNTER — Encounter: Payer: Self-pay | Admitting: Emergency Medicine

## 2019-08-04 NOTE — Telephone Encounter (Signed)
My chart message sent to patient about incontinence supplies. Waiting on response to proceed with completion of paperwork.

## 2019-08-05 IMAGING — DX DG CHEST 2V
2 series · 2 of 2 positions shown · non-contrast
Comparison: 11/03/2017

CLINICAL DATA: Syncope

EXAM:
CHEST - 2 VIEW

[w chest lat]
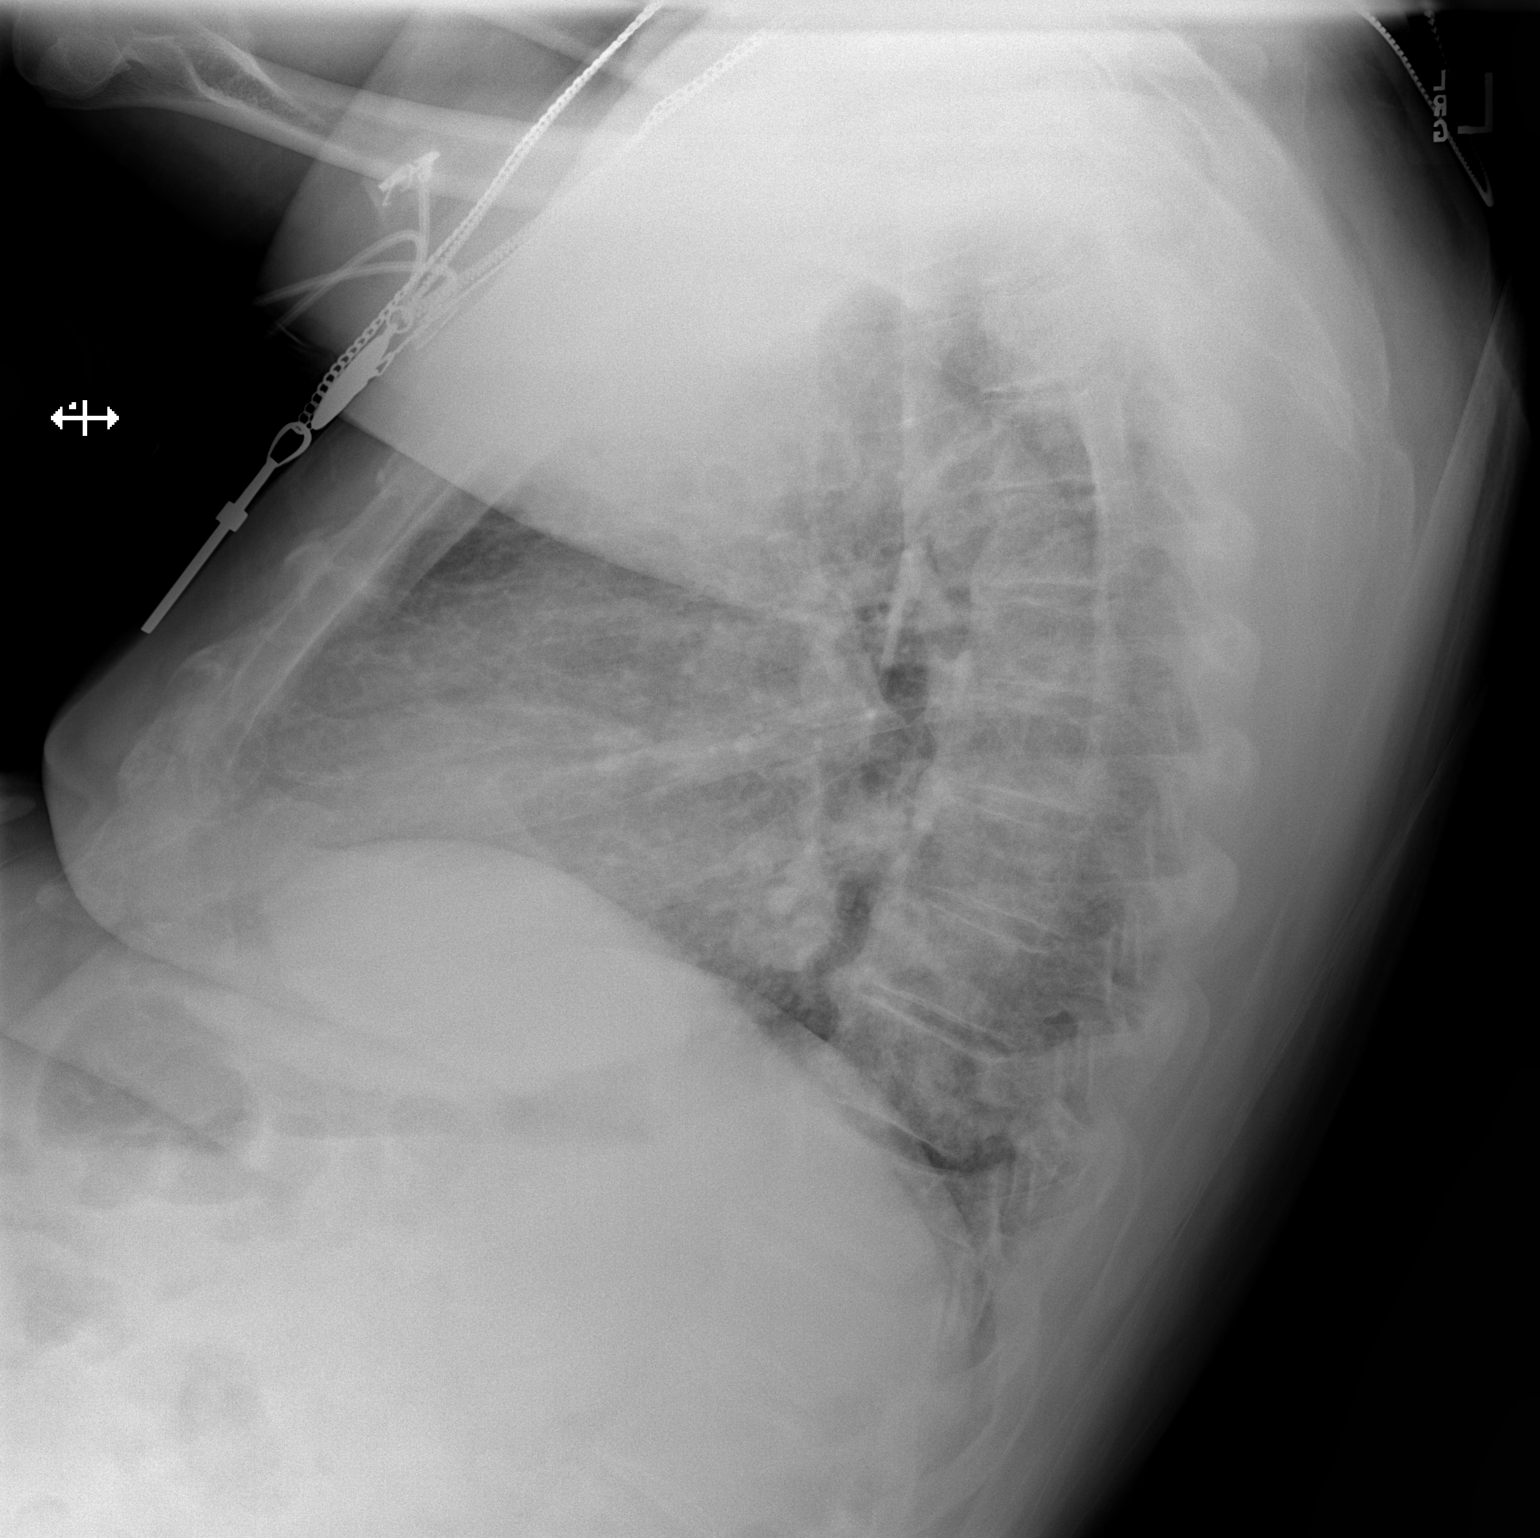

[x chest ap]
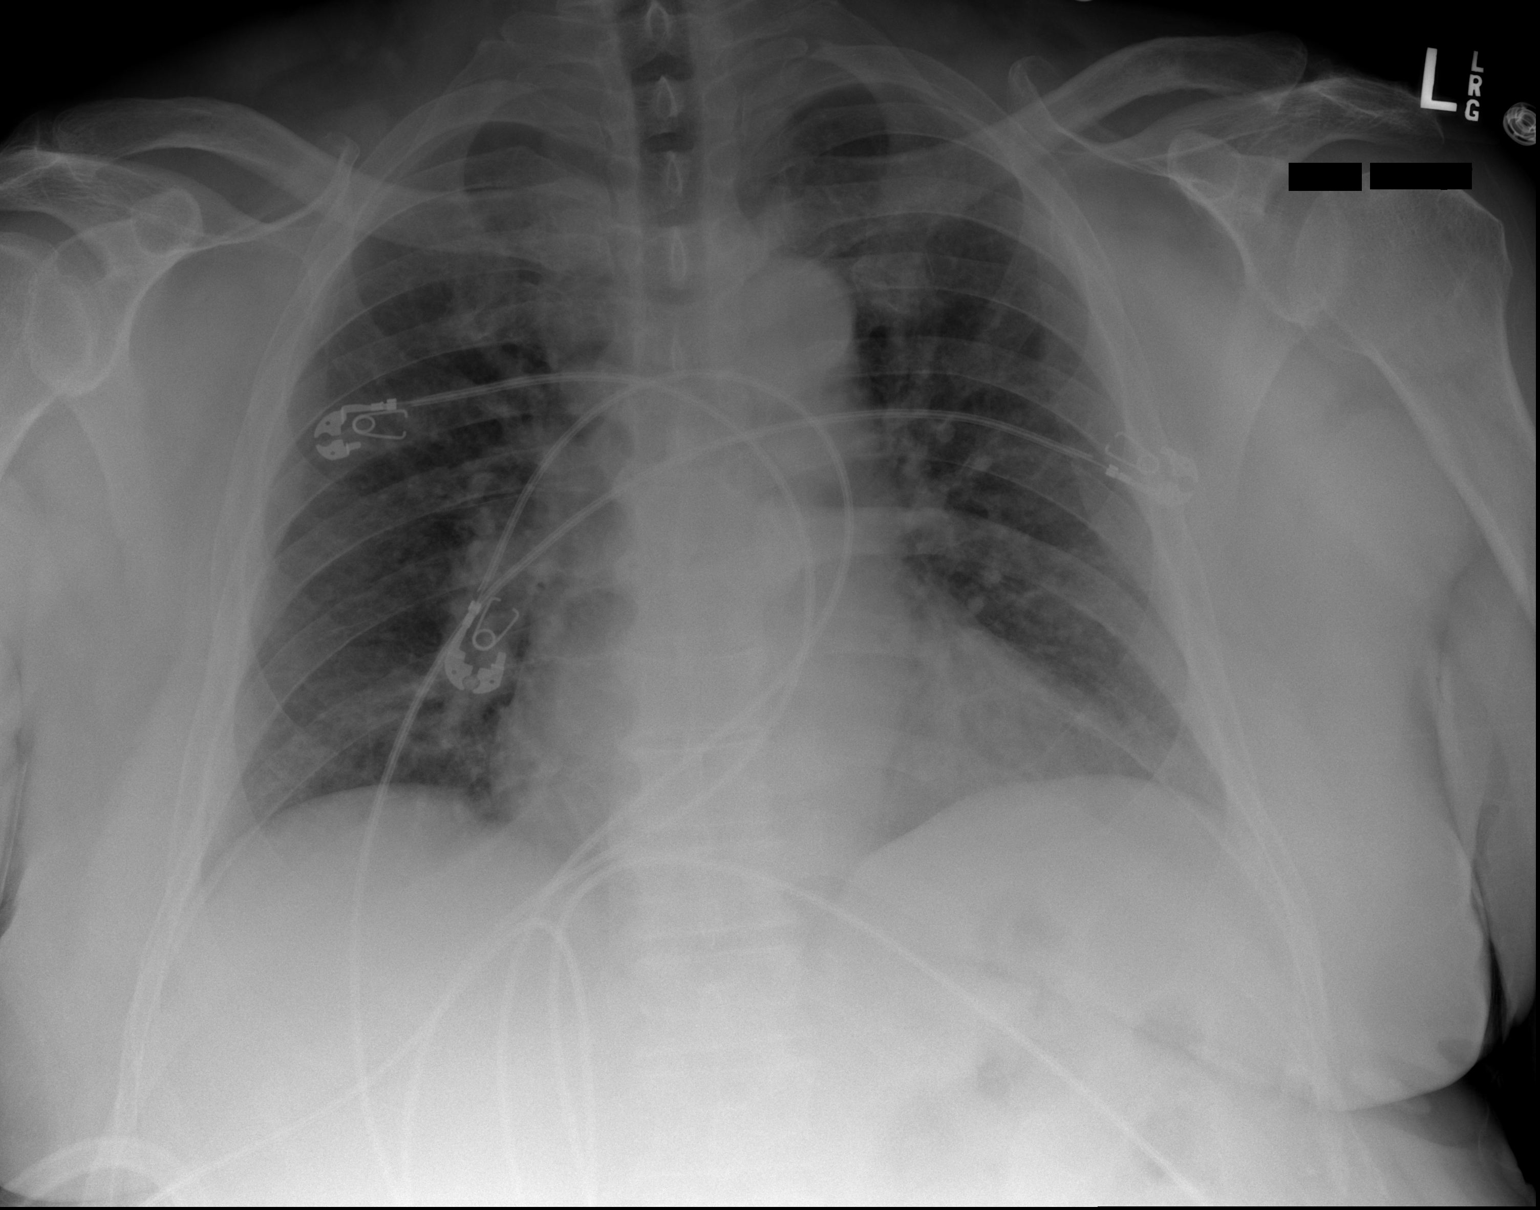

[2 of 2 positions shown; findings below may reference images not displayed]

FINDINGS: The heart size and mediastinal contours are within normal limits.
Both lungs are clear. The visualized skeletal structures are
unremarkable.
IMPRESSION: No active cardiopulmonary disease.

## 2019-08-05 NOTE — Telephone Encounter (Signed)
My chart message sent to the patient about incontinence supplies

## 2019-08-08 ENCOUNTER — Other Ambulatory Visit: Payer: Self-pay

## 2019-08-08 ENCOUNTER — Ambulatory Visit (INDEPENDENT_AMBULATORY_CARE_PROVIDER_SITE_OTHER): Payer: Medicare HMO | Admitting: Orthopaedic Surgery

## 2019-08-08 DIAGNOSIS — M1712 Unilateral primary osteoarthritis, left knee: Secondary | ICD-10-CM | POA: Diagnosis not present

## 2019-08-08 NOTE — Progress Notes (Signed)
   Procedure Note  Patient: Samuel [name]             Date of Birth: 1955-08-13           MRN: [id number]             Visit Date: 08/08/2019  Procedures: Visit Diagnoses:  1. Unilateral primary osteoarthritis, left knee     Large Joint Inj: L knee on 08/08/2019 10:25 AM Indications: diagnostic evaluation and pain Details: 22 G 1.5 in needle, superolateral approach  Arthrogram: No  Medications: (Visco-3) Outcome: tolerated well, no immediate complications Procedure, treatment alternatives, risks and benefits explained, specific risks discussed. Consent was given by the patient. Immediately prior to procedure a time out was called to verify the correct patient, procedure, equipment, support staff and site/side marked as required. Patient was prepped and draped in the usual sterile fashion.    The patient is here today for scheduled hyaluronic acid injection in his left knee today to treat the pain from osteoarthritis.  This will be with Visco-3.  He has had successful hyaluronic acid injections in the past.  His knee has been hurting him quite a bit.  He has had no other acute change in medical status.  On examination of his left knee there is no effusion but there is varus malalignment.  There is medial lateral joint line tenderness as well as patellofemoral crepitation.  The knee is ligamentously stable with no effusion.  I did place the hyaluronic acid injection into his left knee without difficulty.  All questions concerns were answered and addressed.  Follow-up will be as needed.

## 2019-08-10 NOTE — Telephone Encounter (Signed)
Left another message on VM if patient requested incontinence supplies from [facility].  If not to let us know and we will discard the paperwork

## 2019-08-12 ENCOUNTER — Encounter: Payer: Self-pay | Admitting: Physician Assistant

## 2019-08-12 ENCOUNTER — Other Ambulatory Visit: Payer: Self-pay

## 2019-08-12 ENCOUNTER — Ambulatory Visit (INDEPENDENT_AMBULATORY_CARE_PROVIDER_SITE_OTHER): Payer: Medicare HMO | Admitting: Physician Assistant

## 2019-08-12 DIAGNOSIS — N3281 Overactive bladder: Secondary | ICD-10-CM

## 2019-08-12 DIAGNOSIS — M170 Bilateral primary osteoarthritis of knee: Secondary | ICD-10-CM | POA: Diagnosis not present

## 2019-08-12 MED ORDER — MELOXICAM 15 MG PO TABS: 15.0000 mg | ORAL_TABLET | Freq: Every day | ORAL | 0 refills | Status: DC

## 2019-08-12 NOTE — Progress Notes (Deleted)
   Virtual Visit via Video   I connected with patient on 08/12/19 at  4:00 PM EST by a video enabled telemedicine application and verified that I am speaking with the correct person using two identifiers.  Location patient: Home Location provider: [facility], Office Persons participating in the virtual visit: Patient, Provider, [place] (Patina Moore)  I discussed the limitations of evaluation and management by telemedicine and the availability of in person appointments. The patient expressed understanding and agreed to proceed.  Subjective:   HPI:   ***  ROS:   See pertinent positives and negatives per HPI.  Patient Active Problem List   Diagnosis Date Noted  . Unilateral primary osteoarthritis, left knee 06/22/2018  . Unilateral primary osteoarthritis, right knee 06/22/2018  . Screening for colorectal cancer 05/17/2018  . Nonobstructive atherosclerosis of coronary artery 03/05/2018  . Obesity (BMI 30-39.9) 03/05/2018  . Former heavy cigarette smoker (20-39 per day) 03/05/2018  . Varicose veins of bilateral lower extremities with pain 12/28/2017  . Umbilical hernia [id number]  . Alcohol abuse, in remission 09/17/2017  . Osteoarthritis of knee 08/10/2017  . Rosacea 06/11/2017  . Subclinical hypothyroidism 06/05/2017  . Essential hypertension 05/27/2017  . Bilateral hearing loss 05/04/2014    Social History   Tobacco Use  . Smoking status: Former Smoker    Packs/day: 2.00    Years: 30.00    Pack years: 60.00    Types: Cigarettes    Quit date: 06/16/1998    Years since quitting: 21.1  . Smokeless tobacco: Never Used  Substance Use Topics  . Alcohol use: No    Comment: sober 08-28-2017    Current Outpatient Medications:  .  amLODipine (NORVASC) 5 MG tablet, Take 1 tablet (5 mg total) by mouth daily., Disp: 90 tablet, Rfl: 1 .  cyclobenzaprine (FLEXERIL) 5 MG tablet, Take 1 tablet (5 mg total) by mouth 3 (three) times daily as needed., Disp: 30 tablet, Rfl: 2 .   hydrochlorothiazide (HYDRODIURIL) 25 MG tablet, TAKE ONE TABLET BY MOUTH DAILY, Disp: 90 tablet, Rfl: 1 .  ibuprofen (ADVIL) 200 MG tablet, Take 200 mg by mouth every 6 (six) hours as needed., Disp: , Rfl:   Allergies  Allergen Reactions  . Other Itching and Other (See Comments)    Opioids - "scratch my face off"     Objective:   There were no vitals taken for this visit.  Patient is well-developed, well-nourished in no acute distress.  Resting comfortably *** at home.  Head is normocephalic, atraumatic.  No labored breathing.  Speech is clear and coherent with logical content.  Patient is alert and oriented at baseline.  ***  Assessment and Plan:        ***.   Leeanne Rio, [place] 08/12/2019

## 2019-08-12 NOTE — Progress Notes (Signed)
I have discussed the procedure for the virtual visit with the patient who has given consent to proceed with assessment and treatment.   Samuel [name] S [name], [name]     

## 2019-08-13 NOTE — Progress Notes (Signed)
Virtual Visit via Telephone Note  I connected with Samuel [name] on 08/13/19 at  4:00 PM EST by telephone and verified that I am speaking with the correct person using two identifiers.  Location: Patient: Home Provider: [facility]   I discussed the limitations, risks, security and privacy concerns of performing an evaluation and management service by telephone and the availability of in person appointments. I also discussed with the patient that there may be a patient responsible charge related to this service. The patient expressed understanding and agreed to proceed.  History of Present Illness: Patient presents via telephone today as he is unable to use video platform.   Patient requesting supplies for incontinence. Endorses several months of gradually worsening urinary urgency with "near misses" regarding incontinence. Denies any leakage. Denies any notation of incomplete bladder emptying. Nocturia x 0-1. Denies hematuria, dysuria, hesitancy, post void dribbling. Keeping hydrated and limiting caffeine. Denies stool changes or constipation.  Patient also endorses recently seeing [facility] for injections in his knees bilaterally. Notes they typically help slightly but this time they are not. Has been told he needs TKR bilaterally. Notes over the past few days his L knee is significantly painful. Denies trauma or injury. Denies twisting, popping, hyperextension of the knee. Denies swelling, bruising or other skin changes. States he has been taking Ibuprofen 600 mg BID with only some relief.    Observations/Objective: No labored breathing.  Speech is clear and coherent with logical content.  Patient is alert and oriented at baseline.   Assessment and Plan: 1. OAB (overactive bladder) No episodes of incontinence but has almost had episodes of urge incontinence due to severity of urgency. Denies polyuria, polydipsia or polyphagia. Denies nocturia. No  indication for infection. Suspect OAB but want further assessment. Referral to Urology placed. I will sign for the faxed orders for adult diapers to have on hand for his peace of mind.  - Ambulatory referral to Urology  2. Primary osteoarthritis of both knees End-stage OA per patient report with flare in L knee. Recently received injection. Rx Meloxicam to start but patient to contact [facility] for acute assessment and to discuss next steps since he is already in their care.  - meloxicam (MOBIC) 15 MG tablet; Take 1 tablet (15 mg total) by mouth daily.  Dispense: 15 tablet; Refill: 0   Follow Up Instructions:    I discussed the assessment and treatment plan with the patient. The patient was provided an opportunity to ask questions and all were answered. The patient agreed with the plan and demonstrated an understanding of the instructions.   The patient was advised to call back or seek an in-person evaluation if the symptoms worsen or if the condition fails to improve as anticipated.  I provided 15 minutes of non-face-to-face time during this encounter.   Leeanne Rio, PA-C

## 2019-08-15 ENCOUNTER — Other Ambulatory Visit: Payer: Self-pay | Admitting: Physician Assistant

## 2019-08-15 ENCOUNTER — Encounter: Payer: Self-pay | Admitting: Orthopaedic Surgery

## 2019-08-15 ENCOUNTER — Other Ambulatory Visit: Payer: Self-pay

## 2019-08-15 ENCOUNTER — Ambulatory Visit (INDEPENDENT_AMBULATORY_CARE_PROVIDER_SITE_OTHER): Payer: Medicare HMO | Admitting: Orthopaedic Surgery

## 2019-08-15 DIAGNOSIS — I10 Essential (primary) hypertension: Secondary | ICD-10-CM

## 2019-08-15 DIAGNOSIS — M1712 Unilateral primary osteoarthritis, left knee: Secondary | ICD-10-CM | POA: Diagnosis not present

## 2019-08-15 MED ORDER — SODIUM HYALURONATE (VISCOSUP) 20 MG/2ML IX SOSY
20.0000 mg | PREFILLED_SYRINGE | INTRA_ARTICULAR | Status: AC | PRN
  Administered 2019-08-15: 09:00:00 20 mg via INTRA_ARTICULAR

## 2019-08-15 NOTE — Progress Notes (Addendum)
   Procedure Note  Patient: Samuel [name]             Date of Birth: 25-Jul-1955           MRN: [id number]             Visit Date: 08/15/2019  Procedures: Visit Diagnoses:  1. Unilateral primary osteoarthritis, left knee     Large Joint Inj on 08/15/2019 9:06 AM Indications: diagnostic evaluation and pain Details: 22 G 1.5 in needle, superolateral approach  Arthrogram: No  Medications: 20 mg Sodium Hyaluronate 20 MG/2ML Outcome: tolerated well, no immediate complications Procedure, treatment alternatives, risks and benefits explained, specific risks discussed. Consent was given by the patient. Immediately prior to procedure a time out was called to verify the correct patient, procedure, equipment, support staff and site/side marked as required. Patient was prepped and draped in the usual sterile fashion.    The patient is here today for scheduled hyaluronic acid injection with Visco-3.  This is to treat the pain from osteoarthritis in that left knee.  He has tried and failed all forms of conservative treatment including steroid injections.  His pain is daily and at this point is gotten worse.  It is definitely affecting his mobility, his quality of life and his activities day living.  On exam there is no effusion of his left knee.  He does have slight varus malalignment.  He is got good range of motion but global tenderness.  I did place the hyaluronic acid injection in his knee on the left side without difficulty.  If this does not work our next option would be knee replacement surgery.  All questions and concerns were answered and addressed.  Follow-up as otherwise as needed.

## 2019-08-16 DIAGNOSIS — R32 Unspecified urinary incontinence: Secondary | ICD-10-CM | POA: Diagnosis not present

## 2019-08-22 ENCOUNTER — Other Ambulatory Visit: Payer: Self-pay

## 2019-08-22 ENCOUNTER — Ambulatory Visit (INDEPENDENT_AMBULATORY_CARE_PROVIDER_SITE_OTHER): Payer: Medicare HMO | Admitting: Orthopaedic Surgery

## 2019-08-22 DIAGNOSIS — M1712 Unilateral primary osteoarthritis, left knee: Secondary | ICD-10-CM | POA: Diagnosis not present

## 2019-08-22 MED ORDER — SODIUM HYALURONATE (VISCOSUP) 20 MG/2ML IX SOSY
20.0000 mg | PREFILLED_SYRINGE | INTRA_ARTICULAR | Status: AC | PRN
  Administered 2019-08-22: 20 mg via INTRA_ARTICULAR

## 2019-08-22 NOTE — Progress Notes (Signed)
   Procedure Note  Patient: Samuel [name]             Date of Birth: 11-Mar-1956           MRN: [id number]             Visit Date: 08/22/2019  Procedures: Visit Diagnoses:  1. Unilateral primary osteoarthritis, left knee     Large Joint Inj: L knee on 08/22/2019 9:53 AM Indications: diagnostic evaluation and pain Details: 22 G 1.5 in needle, superolateral approach  Arthrogram: No  Medications: 20 mg Sodium Hyaluronate 20 MG/2ML Outcome: tolerated well, no immediate complications Procedure, treatment alternatives, risks and benefits explained, specific risks discussed. Consent was given by the patient. Immediately prior to procedure a time out was called to verify the correct patient, procedure, equipment, support staff and site/side marked as required. Patient was prepped and draped in the usual sterile fashion.    The patient is here today for a hyaluronic acid Visco supplement injection into his left knee to treat the pain from osteoarthritis.  He has failed other conservative treatment measures including steroid injections.  He has known well-documented arthritis in that knee.  On exam there is varus malalignment with patellofemoral crepitation as well as medial joint line tenderness.  The knee is stable with good range of motion.  We did place Visco-3 in his left knee today without difficulty.  All question concerns were answered and addressed.  Follow-up will be as needed.

## 2019-09-12 ENCOUNTER — Other Ambulatory Visit: Payer: Self-pay | Admitting: Emergency Medicine

## 2019-09-12 DIAGNOSIS — I10 Essential (primary) hypertension: Secondary | ICD-10-CM

## 2019-09-12 MED ORDER — AMLODIPINE BESYLATE 5 MG PO TABS: 5.0000 mg | ORAL_TABLET | Freq: Every day | ORAL | 1 refills | Status: DC

## 2019-09-14 ENCOUNTER — Telehealth: Payer: Self-pay | Admitting: Radiology

## 2019-09-14 NOTE — Telephone Encounter (Signed)
Patient left voicemail requesting Rx for a hinged knee brace. He wanted to speak with Dr. Ninfa Linden or his assistant.  I did call patient back and left voicemail to discuss knee brace with call back.  Patient CB # [phone]

## 2019-09-15 ENCOUNTER — Telehealth: Payer: Self-pay | Admitting: Orthopaedic Surgery

## 2019-09-15 ENCOUNTER — Ambulatory Visit (INDEPENDENT_AMBULATORY_CARE_PROVIDER_SITE_OTHER): Payer: Medicare HMO | Admitting: Orthopedic Surgery

## 2019-09-15 ENCOUNTER — Other Ambulatory Visit: Payer: Self-pay

## 2019-09-15 DIAGNOSIS — M1712 Unilateral primary osteoarthritis, left knee: Secondary | ICD-10-CM

## 2019-09-15 NOTE — Progress Notes (Signed)
Patient came into today left knee brace fitting. Placed in left hinged knee brace.

## 2019-09-15 NOTE — Telephone Encounter (Signed)
Duplicate message. 

## 2019-09-15 NOTE — Telephone Encounter (Signed)
LVM for patient. Advised we could hand write Rx for him to pick up if that is what he needs, also that we do have some braces here in the office. Advised office is closed tomorrow (09/16/19). Requested call back to talk with patient.

## 2019-09-15 NOTE — Telephone Encounter (Signed)
Patient called requesting a call back. Patient states to have call insurance company for approval for knee brace. Patient states the brace needs to have a hinged knee brace. Please call patient before order placed for brace. Patient phone number is [phone].

## 2019-09-20 ENCOUNTER — Telehealth: Payer: Self-pay | Admitting: Family Medicine

## 2019-09-20 DIAGNOSIS — M545 Low back pain, unspecified: Secondary | ICD-10-CM

## 2019-10-03 NOTE — Telephone Encounter (Signed)
Patient called regarding this refill request.  He said that it was prescribed for 3 times a day, but he has generally only been taking one a week as needed. He has noticed without, he begins to have back pain and this is the only thing that seems to help.

## 2019-10-04 DIAGNOSIS — R69 Illness, unspecified: Secondary | ICD-10-CM | POA: Diagnosis not present

## 2019-10-04 MED ORDER — CYCLOBENZAPRINE HCL 5 MG PO TABS: 5.0000 mg | ORAL_TABLET | Freq: Every evening | ORAL | 2 refills | Status: DC | PRN

## 2019-10-04 NOTE — Telephone Encounter (Signed)
Cyclobenzaprine refilled.

## 2019-10-04 NOTE — Addendum Note (Signed)
Addended by: Gregor Hams on: 10/04/2019 07:37 AM   Modules accepted: Orders

## 2019-10-19 DIAGNOSIS — R32 Unspecified urinary incontinence: Secondary | ICD-10-CM | POA: Diagnosis not present

## 2019-10-20 ENCOUNTER — Other Ambulatory Visit: Payer: Self-pay

## 2019-10-20 ENCOUNTER — Telehealth (INDEPENDENT_AMBULATORY_CARE_PROVIDER_SITE_OTHER): Payer: Medicare HMO | Admitting: Physician Assistant

## 2019-10-20 ENCOUNTER — Encounter: Payer: Self-pay | Admitting: Physician Assistant

## 2019-10-20 VITALS — HR 62

## 2019-10-20 DIAGNOSIS — L84 Corns and callosities: Secondary | ICD-10-CM

## 2019-10-20 NOTE — Progress Notes (Signed)
   Virtual Visit via Video   I connected with patient on 10/20/19 at  9:00 AM EDT by a video enabled telemedicine application and verified that I am speaking with the correct person using two identifiers.  Location patient: Home Location provider: [facility], Office Persons participating in the virtual visit: Patient, Provider, [place] (Patina Moore)  I discussed the limitations of evaluation and management by telemedicine and the availability of in person appointments. The patient expressed understanding and agreed to proceed.  Subjective:   HPI:   Patient presents via [facility] today complaining of ongoing callus of left foot, just proximal to plantar surface of fifth phalanx.  Notes started out very small but is enlarged to about the size of a dime.  Is quite painful.  Denies splitting of the skin here.  Has tried over-the-counter callus and corn removers, noting some slight improvement but then comes right back.  Giving severity of pain with ambulation would like callus removal.  ROS:   See pertinent positives and negatives per HPI.  Patient Active Problem List   Diagnosis Date Noted  . Unilateral primary osteoarthritis, left knee 06/22/2018  . Unilateral primary osteoarthritis, right knee 06/22/2018  . Screening for colorectal cancer 05/17/2018  . Nonobstructive atherosclerosis of coronary artery 03/05/2018  . Obesity (BMI 30-39.9) 03/05/2018  . Former heavy cigarette smoker (20-39 per day) 03/05/2018  . Varicose veins of bilateral lower extremities with pain 12/28/2017  . Umbilical hernia [id number]  . Alcohol abuse, in remission 09/17/2017  . Osteoarthritis of knee 08/10/2017  . Rosacea 06/11/2017  . Subclinical hypothyroidism 06/05/2017  . Essential hypertension 05/27/2017  . Bilateral hearing loss 05/04/2014    Social History   Tobacco Use  . Smoking status: Former Smoker    Packs/day: 2.00    Years: 30.00    Pack years: 60.00    Types: Cigarettes    Quit  date: 06/16/1998    Years since quitting: 21.3  . Smokeless tobacco: Never Used  Substance Use Topics  . Alcohol use: No    Comment: sober 08-28-2017    Current Outpatient Medications:  .  amLODipine (NORVASC) 5 MG tablet, Take 1 tablet (5 mg total) by mouth daily., Disp: 90 tablet, Rfl: 1 .  cyclobenzaprine (FLEXERIL) 5 MG tablet, Take 1 tablet (5 mg total) by mouth at bedtime as needed for muscle spasms., Disp: 30 tablet, Rfl: 2 .  meloxicam (MOBIC) 15 MG tablet, Take 1 tablet (15 mg total) by mouth daily. (Patient not taking: Reported on 10/20/2019), Disp: 15 tablet, Rfl: 0  Allergies  Allergen Reactions  . Other Itching and Other (See Comments)    Opioids - "scratch my face off"     Objective:   Pulse 62   Patient is well-developed, well-nourished in no acute distress.  Resting comfortably at home.  Head is normocephalic, atraumatic.  No labored breathing.  Speech is clear and coherent with logical content.  Patient is alert and oriented at baseline.  Unable to visualize plantar surface of foot via video today.  Assessment and Plan:   1. Callus of foot Ongoing. Symptomatic. Residual despite OTC measures. Supportive measures reviewed. Referral to Podiatry placed for management/removal.  - Ambulatory referral to [place], PA-C 10/20/2019

## 2019-10-20 NOTE — Progress Notes (Signed)
I have discussed the procedure for the virtual visit with the patient who has given consent to proceed with assessment and treatment.   Neidy Guerrieri N Euphemia Lingerfelt, LPN    . 

## 2019-11-07 ENCOUNTER — Telehealth: Payer: Self-pay | Admitting: Family Medicine

## 2019-11-07 ENCOUNTER — Encounter: Payer: Self-pay | Admitting: Podiatrist

## 2019-11-07 ENCOUNTER — Ambulatory Visit (INDEPENDENT_AMBULATORY_CARE_PROVIDER_SITE_OTHER): Payer: Medicare HMO | Admitting: Podiatrist

## 2019-11-07 ENCOUNTER — Other Ambulatory Visit: Payer: Self-pay

## 2019-11-07 VITALS — Temp 97.5°F

## 2019-11-07 DIAGNOSIS — L851 Acquired keratosis [keratoderma] palmaris et plantaris: Secondary | ICD-10-CM

## 2019-11-07 DIAGNOSIS — M216X2 Other acquired deformities of left foot: Secondary | ICD-10-CM

## 2019-11-07 NOTE — Telephone Encounter (Signed)
error 

## 2019-11-07 NOTE — Patient Instructions (Signed)

## 2019-11-07 NOTE — Progress Notes (Signed)
  Chief Complaint  Patient presents with  . Skin Problem    ? Callus - L plantar forefoot, submet 5. x6 weeks. Pt stated, "Doesn't always hurt, but when it does, the pain shoots up to my head".     HPI: Patient is 64 y.o. male who presents today for the concerns as listed above.    Review of Systems No fevers, chills, nausea, muscle aches, no difficulty breathing, no calf pain, no chest pain or shortness of breath.   Physical Exam  GENERAL APPEARANCE: Alert, conversant. Appropriately groomed. No acute distress.   VASCULAR: Pedal pulses palpable DP and PT bilateral.  Capillary refill time is immediate to all digits,  Proximal to distal cooling it warm to warm.  Digital hair growth is present bilateral   NEUROLOGIC: sensation is intact epicritically and protectively to 5.07 monofilament at 5/5 sites bilateral.  Light touch is intact bilateral, vibratory sensation intact bilateral, achilles tendon reflex is intact bilateral.   MUSCULOSKELETAL: acceptable muscle strength, tone and stability bilateral.  No gross boney pedal deformities noted.  No pain, crepitus or limitation noted with foot and ankle range of motion bilateral.   DERMATOLOGIC: skin is warm, supple, and dry.  No open lesions noted.  No rash, no pre ulcerative lesions. Digital nails are asymptomatic.   Porokeratotic lesion submet 5 left foot is noted.    Assessment     ICD-10-CM   1. Acquired plantar porokeratosis  L85.1   2. Plantar flexed metatarsal bone of left foot  M21.6X2      Plan  Pared the porokeratotic lesion with a 15 blade and removed the central core.  Applied salinocaine and padded.  Gave instructions for follow up and discussed the lesion will likely return. Discussed the positive benefit of an orthotic with an offloading pad.  He will return as needed for follow up

## 2019-11-09 ENCOUNTER — Ambulatory Visit (INDEPENDENT_AMBULATORY_CARE_PROVIDER_SITE_OTHER): Payer: Medicare HMO | Admitting: Family Medicine

## 2019-11-09 ENCOUNTER — Encounter: Payer: Self-pay | Admitting: Family Medicine

## 2019-11-09 ENCOUNTER — Other Ambulatory Visit: Payer: Self-pay

## 2019-11-09 VITALS — BP 138/72 | HR 70 | Ht 66.0 in | Wt 246.4 lb

## 2019-11-09 DIAGNOSIS — G8929 Other chronic pain: Secondary | ICD-10-CM

## 2019-11-09 DIAGNOSIS — M545 Low back pain, unspecified: Secondary | ICD-10-CM

## 2019-11-09 DIAGNOSIS — M170 Bilateral primary osteoarthritis of knee: Secondary | ICD-10-CM | POA: Diagnosis not present

## 2019-11-09 MED ORDER — MELOXICAM 15 MG PO TABS: 15.0000 mg | ORAL_TABLET | Freq: Every day | ORAL | 0 refills | Status: DC

## 2019-11-09 MED ORDER — LORAZEPAM 0.5 MG PO TABS: ORAL_TABLET | ORAL | 0 refills | Status: DC

## 2019-11-09 NOTE — Patient Instructions (Signed)
Thank you for coming in today. Plan for MRI.  Recheck after MRI and will plan for injection.  Will keep spine stimulator as a backup if we cannot get you better with something less invasive.

## 2019-11-09 NOTE — Progress Notes (Addendum)
I, Wendy Poet, LAT, ATC, am serving as scribe for Dr. Lynne Leader.  Samuel [name] is a 64 y.o. male who presents to [place] at [facility] today for f/u of chronic LBP w/ radiating pain into his B LEs.  He was last seen by Dr. Georgina Snell on 05/02/19 and was referred to outpatient PT but declined to go due to co-pay amount.  He was advised to use a TENs unit to help w/ pain control.  He has tried prednisone dose pack, Flexeril and lidocaine patches in the past.  Since then, pt reports chronic, constant pain w/ pain radiating into his B LEs.  He has been using Tylenol, Flexeril.  He states that the pain is changing his mood and making him angry.  He is curious if he might be a candidate for a spinal cord stimulator.    Diagnostic imaging: L-spine XR- 05/02/19  Pertinent review of systems: No fevers or chills  Relevant historical information: Hypothyroidism hypertension coronary artery disease former smoker alcohol abuse in remission.   Exam:  BP 138/72 (BP Location: Right Arm, Patient Position: Sitting, Cuff Size: Large)   Pulse 70   Ht 5\' 6"  (1.676 m)   Wt 246 lb 6.4 oz (111.8 kg)   SpO2 99%   BMI 39.77 kg/m  General: Well Developed, well nourished, and in no acute distress.   MSK: L-spine normal-appearing Nontender midline.  Decreased lumbar motion.  Normal gait.    Lab and Radiology Results EXAM: LUMBAR SPINE - COMPLETE 4+ VIEW  COMPARISON:  None.  FINDINGS: There is no acute displaced fracture. No dislocation. Mild facet arthrosis is noted at the lower lumbar segments. There is multilevel disc height loss, greatest at the L2-L3 level. Aortic calcifications are noted.  IMPRESSION: 1. No acute osseous abnormality. 2. Mild multilevel degenerative changes are noted of the lumbar spine.   Electronically Signed   By: Constance Holster M.D.   On: 05/02/2019 19:40 I, Lynne Leader, personally (independently) visualized and performed the interpretation  of the images attached in this note.     Assessment and Plan: 64 y.o. male with chronic low back pain failing conservative management.  At this point patient has had a trial of physician directed conservative management for 6 months.  Plan to proceed with MRI for facet injection planning.  Would consider spinal stimulator after trying some other options first likely.  Recheck after MRI.  Ativan for sedation anxiety with MRI. Orders Placed This Encounter  Procedures  . MR Lumbar Spine Wo Contrast    Standing Status:   Future    Standing Expiration Date:   11/08/2020    Order Specific Question:   What is the patient's sedation requirement?    Answer:   No Sedation    Order Specific Question:   Does the patient have a pacemaker or implanted devices?    Answer:   No    Order Specific Question:   Preferred imaging location?    Answer:   [facility] (table limit-350lbs)    Order Specific Question:   Radiology Contrast Protocol - do NOT remove file path    Answer:   \\[email or web address]\[email or web address]   Meds ordered this encounter  Medications  . LORazepam (ATIVAN) 0.5 MG tablet    Sig: 1-2 tabs 30 - 60 min prior to MRI. Do not drive with this medicine.    Dispense:  4 tablet    Refill:  0  . meloxicam (MOBIC) 15 MG tablet  Sig: Take 1 tablet (15 mg total) by mouth daily.    Dispense:  15 tablet    Refill:  0     Discussed warning signs or symptoms. Please see discharge instructions. Patient expresses understanding.   The above documentation has been reviewed and is accurate and complete Lynne Leader, M.D.   Addendum corrected mistyped assessment and plan

## 2019-11-16 DIAGNOSIS — R32 Unspecified urinary incontinence: Secondary | ICD-10-CM | POA: Diagnosis not present

## 2019-11-19 ENCOUNTER — Ambulatory Visit (INDEPENDENT_AMBULATORY_CARE_PROVIDER_SITE_OTHER): Payer: Medicare HMO

## 2019-11-19 ENCOUNTER — Other Ambulatory Visit: Payer: Self-pay

## 2019-11-19 DIAGNOSIS — G8929 Other chronic pain: Secondary | ICD-10-CM

## 2019-11-19 DIAGNOSIS — M545 Low back pain, unspecified: Secondary | ICD-10-CM

## 2019-11-21 NOTE — Progress Notes (Signed)
MRI shows arthritis in the back contributing to back pain.  Please schedule follow-up with me to discuss MRI findings and areas of possible injection.

## 2019-11-23 ENCOUNTER — Other Ambulatory Visit: Payer: Self-pay

## 2019-11-23 ENCOUNTER — Encounter: Payer: Self-pay | Admitting: Family Medicine

## 2019-11-23 ENCOUNTER — Telehealth: Payer: Self-pay | Admitting: Family Medicine

## 2019-11-23 ENCOUNTER — Ambulatory Visit (INDEPENDENT_AMBULATORY_CARE_PROVIDER_SITE_OTHER): Payer: Medicare HMO | Admitting: Family Medicine

## 2019-11-23 VITALS — BP 140/72 | HR 69 | Ht 66.0 in | Wt 243.8 lb

## 2019-11-23 DIAGNOSIS — M170 Bilateral primary osteoarthritis of knee: Secondary | ICD-10-CM

## 2019-11-23 DIAGNOSIS — G8929 Other chronic pain: Secondary | ICD-10-CM

## 2019-11-23 DIAGNOSIS — M48061 Spinal stenosis, lumbar region without neurogenic claudication: Secondary | ICD-10-CM

## 2019-11-23 DIAGNOSIS — M545 Low back pain, unspecified: Secondary | ICD-10-CM

## 2019-11-23 NOTE — Telephone Encounter (Signed)
Pt called again, would also like the Flexeril refilled.

## 2019-11-23 NOTE — Progress Notes (Signed)
I, Wendy Poet, LAT, ATC, am serving as scribe for Dr. Lynne Leader.  Samuel [name] is a 65 y.o. male who presents to [place] at [facility] today for f/u of chronic LBP and radiating pain into his B LEs.  He was last seen by Dr. Georgina Snell on 11/09/19 and was referred for an MRI and prescribed Mobic.  He has tried Tylenol, Flexeril and prednisone in the past.  Since his last visit, pt reports that the Meloxicam helped his symptoms but has run out of those.  He states that his low back and knees felt better while taking the Meloxicam.  Diagnostic testing: L-spine XR- 05/02/19; L-spine MRI- 11/19/19   Pertinent review of systems: No fevers or chills  Relevant historical information: Knee DJD   Exam:  BP 140/72 (BP Location: Right Arm, Patient Position: Sitting, Cuff Size: Large)   Pulse 69   Ht 5\' 6"  (1.676 m)   Wt 243 lb 12.8 oz (110.6 kg)   SpO2 98%   BMI 39.35 kg/m  General: Well Developed, well nourished, and in no acute distress.   MSK: L-spine decreased lumbar motion.  Normal gait.    Lab and Radiology Results No results found for this or any previous visit (from the past 72 hour(s)). MR Lumbar Spine Wo Contrast  Result Date: 11/21/2019 CLINICAL DATA:  Low back pain. EXAM: MRI LUMBAR SPINE WITHOUT CONTRAST TECHNIQUE: Multiplanar, multisequence MR imaging of the lumbar spine was performed. No intravenous contrast was administered. COMPARISON:  Plain films May 02, 2019 FINDINGS: Segmentation:  Standard. Alignment:  Physiologic. Vertebrae: No fracture, evidence of discitis, or bone lesion. Mild diffuse decrease of the T1 signal throughout the visualized spine, nonspecific, may represent red marrow reconversion. Conus medullaris and cauda equina: Conus extends to the L1 level. Conus and cauda equina appear normal. Paraspinal and other soft tissues: Negative. Disc levels: T12-L1: No spinal canal or neural foraminal stenosis. L1-2: No spinal canal or neural foraminal  stenosis. L2-3: Mild loss of disc height, mild facet degenerative changes with bilateral joint effusion and ligamentum flavum redundancy, resulting in mild spinal canal stenosis and mild bilateral neural foraminal narrowing. L3-4: Disc bulge with superimposed small left foraminal central disc protrusion abutting the exiting left L3 nerve root. There also facet degenerative change ligamentum flavum redundancy contributing for mild to moderate spinal canal stenosis, narrowing of the subarticular zones and mild bilateral neural foraminal narrowing. L4-5: Disc bulge, prominent facet degenerative changes ligamentum flavum redundancy resulting in severe spinal canal stenosis and mild bilateral neural foraminal narrowing. L5-S1: Mild facet degenerative changes. No spinal canal or neural foraminal stenosis. IMPRESSION: 1. Lumbar spondylosis most pronounced at L4-5 where there is severe spinal canal stenosis and mild bilateral neural foraminal narrowing. 2. At L3-4 there is a small left foraminal central disc protrusion abutting the exiting left L3 nerve root. There is also mild to moderate spinal canal stenosis, narrowing of the subarticular zones and mild bilateral neural foraminal narrowing at this level. 3. Mild spinal canal stenosis and mild bilateral neural foraminal narrowing at L2-3. 4. Mild diffuse decrease of the T1 signal throughout the visualized spine, nonspecific, may represent red marrow reconversion. Electronically Signed   By: Pedro Earls M.D.   On: 11/21/2019 09:08   I, Lynne Leader, personally (independently) visualized and performed the interpretation of the images attached in this note.     Assessment and Plan: 64 y.o. male with chronic low back pain without much radiculopathy.  Most pain due  to spinal stenosis at L4-L5.  Patient also has facet DJD.  Plan for interlaminar epidural steroid injection at L4-L5.  If that does not work we will proceed with trial of facet injection or  facet ablation.  Recheck back as needed.   PDMP not reviewed this encounter. Orders Placed This Encounter  Procedures  . DG INJECT DIAG/THERA/INC NEEDLE/CATH/PLC EPI/LUMB/SAC W/IMG    Standing Status:   Future    Standing Expiration Date:   11/22/2020    Order Specific Question:   Reason for Exam (SYMPTOM  OR DIAGNOSIS REQUIRED)    Answer:   L4-L5 interlaminar ot transforminal    Order Specific Question:   Preferred Imaging Location?    Answer:   [id number]    Order Specific Question:   Radiology Contrast Protocol - do NOT remove file path    Answer:   \\[email or web address]   No orders of the defined types were placed in this encounter.    Discussed warning signs or symptoms. Please see discharge instructions. Patient expresses understanding.   The above documentation has been reviewed and is accurate and complete Lynne Leader, M.D.  Total encounter time 20 minutes including charting time date of service. MRI findings and next steps

## 2019-11-23 NOTE — Patient Instructions (Signed)
Thank you for coming in today. Plan for injection.  Call [facility] at [phone] to schedule.  Let me know how you feel after the shot.  Recheck as needed    Epidural Steroid Injection  An epidural steroid injection is a shot of steroid medicine and numbing medicine that is given into the space between the spinal cord and the bones of the back (epidural space). The shot helps relieve pain caused by an irritated or swollen nerve root. The amount of pain relief you get from the injection depends on what is causing the nerve to be swollen and irritated, and how long your pain lasts. You are more likely to benefit from this injection if your pain is strong and comes on suddenly rather than if you have had long-term (chronic) pain. Tell a health care provider about:  Any allergies you have.  All medicines you are taking, including vitamins, herbs, eye drops, creams, and over-the-counter medicines.  Any problems you or family members have had with anesthetic medicines.  Any blood disorders you have.  Any surgeries you have had.  Any medical conditions you have.  Whether you are pregnant or may be pregnant. What are the risks? Generally, this is a safe procedure. However, problems may occur, including:  Headache.  Bleeding.  Infection.  Allergic reaction to medicines.  Nerve damage. What happens before the procedure? Staying hydrated Follow instructions from your health care provider about hydration, which may include:  Up to 2 hours before the procedure - you may continue to drink clear liquids, such as water, clear fruit juice, black coffee, and plain tea. Eating and drinking restrictions Follow instructions from your health care provider about eating and drinking, which may include:  8 hours before the procedure - stop eating heavy meals or foods, such as meat, fried foods, or fatty foods.  6 hours before the procedure - stop eating light meals or foods, such as  toast or cereal.  6 hours before the procedure - stop drinking milk or drinks that contain milk.  2 hours before the procedure - stop drinking clear liquids. Medicines  You may be given medicines to lower anxiety.  Ask your health care provider about: ? Changing or stopping your regular medicines. This is especially important if you are taking diabetes medicines or blood thinners. ? Taking medicines such as aspirin and ibuprofen. These medicines can thin your blood. Do not take these medicines unless your health care provider tells you to take them. ? Taking over-the-counter medicines, vitamins, herbs, and supplements.  Ask your health care provider what steps will be taken to prevent infection. General instructions  Plan to have someone take you home from the hospital or clinic.  If you will be going home right after the procedure, plan to have someone with you for 24 hours. What happens during the procedure?  An IV will be inserted into one of your veins.  You will be given one or more of the following: ? A medicine to help you relax (sedative). ? A medicine to numb the area (local anesthetic).  You will be asked to lie on your abdomen or sit.  The injection site will be cleaned.  A needle will be inserted through your skin into the epidural space. This may cause you some discomfort. An X-ray machine will be used to guide the needle as close as possible to the affected nerve.  A steroid medicine and a local anesthetic will be injected into the epidural space.  The needle and IV will be removed.  A bandage (dressing) will be put over the injection site. The procedure may vary among health care providers and hospitals. What can I expect after the procedure? Follow these instructions at home: Injection site care  You may remove the bandage (dressing) after 24 hours.  Check your injection site every day for signs of infection. Check for: ? Redness, swelling, or  pain. ? Fluid or blood. ? Warmth. ? Pus or a bad smell. Managing pain, stiffness, and swelling  For 24 hours after the procedure: ? Avoid using heat on the injection site. ? Do not take baths, swim, or use a hot tub until your health care provider approves. Ask your health care provider if you may take a shower. You may only be allowed to take sponge baths.  If directed, put ice on the injection site. To do this: ? Put ice in a plastic bag. ? Place a towel between your skin and the bag. ? Leave the ice on for 20 minutes, 2-3 times a day.  Activity  Do not drive for 24 hours if you were given a sedative during your procedure.  Return to your normal activities as told by your health care provider. Ask your health care provider what activities are safe for you. General instructions  Your blood pressure, heart rate, breathing rate, and blood oxygen level will be monitored until you leave the hospital or clinic.  Your arm or leg may feel weak or numb for a few hours.  The injection site may feel sore.  Take over-the-counter and prescription medicines only as told by your health care provider.  Drink enough fluid to keep your urine pale yellow.  Keep all follow-up visits as told by your health care provider. This is important. Contact a health care provider if:  You have any of these signs of infection: ? Redness, swelling, or pain around your injection site. ? Fluid or blood coming from your injection site. ? Warmth coming from your injection site. ? Pus or a bad smell coming from your injection site. ? A fever.  You continue to have pain and soreness around the injection site, even after taking over-the-counter pain medicine.  You have severe, sudden, or lasting nausea or vomiting. Get help right away if:  You have severe pain at the injection site that is not relieved by medicines.  You develop a severe headache or a stiff neck.  You become sensitive to light.  You  have any new numbness or weakness in your legs or arms.  You lose control of your bladder or bowel movements.  You have trouble breathing. Summary  An epidural steroid injection is a shot of steroid medicine and numbing medicine that is given into the epidural space.  The shot helps relieve pain caused by an irritated or swollen nerve root.  You are more likely to benefit from this injection if your pain is strong and comes on suddenly rather than if you have had chronic pain. This information is not intended to replace advice given to you by your health care provider. Make sure you discuss any questions you have with your health care provider. Document Revised: 12/13/2018 Document Reviewed: 12/13/2018 Elsevier Patient Education  [place].

## 2019-11-23 NOTE — Telephone Encounter (Signed)
Patient stopped on his way out requesting a refill of meloxicam (MOBIC) 15 MG tablet.

## 2019-11-24 MED ORDER — MELOXICAM 15 MG PO TABS: 15.0000 mg | ORAL_TABLET | Freq: Every day | ORAL | 0 refills | Status: DC

## 2019-11-24 MED ORDER — CYCLOBENZAPRINE HCL 10 MG PO TABS
10.0000 mg | ORAL_TABLET | Freq: Three times a day (TID) | ORAL | 0 refills | Status: DC | PRN
Start: 2019-11-24 — End: 2019-12-29

## 2019-11-24 NOTE — Telephone Encounter (Signed)
Flexeril and meloxicam refilled.

## 2019-11-30 DIAGNOSIS — R69 Illness, unspecified: Secondary | ICD-10-CM | POA: Diagnosis not present

## 2019-12-01 ENCOUNTER — Ambulatory Visit
Admission: RE | Admit: 2019-12-01 | Discharge: 2019-12-01 | Disposition: A | Discharge status: Home / Self Care | Payer: Medicare HMO | Source: Ambulatory Visit | Attending: Family Medicine | Admitting: Family Medicine

## 2019-12-01 ENCOUNTER — Other Ambulatory Visit: Payer: Self-pay

## 2019-12-01 DIAGNOSIS — M545 Low back pain, unspecified: Secondary | ICD-10-CM

## 2019-12-01 DIAGNOSIS — M48061 Spinal stenosis, lumbar region without neurogenic claudication: Secondary | ICD-10-CM

## 2019-12-01 DIAGNOSIS — G8929 Other chronic pain: Secondary | ICD-10-CM

## 2019-12-01 MED ORDER — IOPAMIDOL (ISOVUE-M 200) INJECTION 41%
1.0000 mL | Freq: Once | INTRAMUSCULAR | Status: AC
  Administered 2019-12-01: 1 mL via EPIDURAL

## 2019-12-01 MED ORDER — METHYLPREDNISOLONE ACETATE 40 MG/ML INJ SUSP (RADIOLOG
120.0000 mg | Freq: Once | INTRAMUSCULAR | Status: AC
  Administered 2019-12-01: 120 mg via EPIDURAL

## 2019-12-01 NOTE — Discharge Instructions (Signed)

## 2019-12-12 ENCOUNTER — Telehealth: Payer: Self-pay | Admitting: Family Medicine

## 2019-12-12 DIAGNOSIS — M170 Bilateral primary osteoarthritis of knee: Secondary | ICD-10-CM

## 2019-12-12 MED ORDER — MELOXICAM 15 MG PO TABS: 15.0000 mg | ORAL_TABLET | Freq: Every day | ORAL | 0 refills | Status: DC

## 2019-12-12 NOTE — Telephone Encounter (Signed)
Patient called to let Dr Georgina Snell know that he had his epidural on 12/01/2019. It has helped tremdously and he is very pleased with the results.   He also asked if he could have his Meloxicam refilled. He has noted that it is helping the pain in his knees.  - [facility]  Please advise.

## 2019-12-12 NOTE — Telephone Encounter (Signed)
Called patient and informed

## 2019-12-12 NOTE — Telephone Encounter (Signed)
Meloxicam refilled  

## 2019-12-16 DIAGNOSIS — R32 Unspecified urinary incontinence: Secondary | ICD-10-CM | POA: Diagnosis not present

## 2019-12-26 NOTE — Progress Notes (Signed)
Subjective:   Samuel [name] is a 64 y.o. male who presents for an Initial Medicare Annual Wellness Visit.  Review of Systems   Cardiac Risk Factors include: advanced age (>72men, >4 women);hypertension;male gender;obesity (BMI >30kg/m2)    Objective:    Today's Vitals   12/27/19 0806  BP: 118/78  Pulse: 86  Resp: 16  SpO2: 99%  Weight: 242 lb 12.8 oz (110.1 kg)  Height: 5\' 6"  (1.676 m)   Body mass index is 39.19 kg/m.  Advanced Directives 12/27/2019 01/25/2018 10/28/2017 06/15/2017 06/25/2014  Does Patient Have a Medical Advance Directive? No Yes Yes No No  Type of Advance Directive - [place];Living will [place];Living will - -  Does patient want to make changes to medical advance directive? - - No - Patient declined - -  Copy of [place] in Chart? - - No - copy requested - -  Would patient like information on creating a medical advance directive? Yes (MAU/Ambulatory/Procedural Areas - Information given) - - No - Patient declined No - patient declined information    Current Medications (verified) Outpatient Encounter Medications as of 12/27/2019  Medication Sig  . amLODipine (NORVASC) 5 MG tablet Take 1 tablet (5 mg total) by mouth daily.  . meloxicam (MOBIC) 15 MG tablet Take 1 tablet (15 mg total) by mouth daily.  . cyclobenzaprine (FLEXERIL) 10 MG tablet Take 1 tablet (10 mg total) by mouth 3 (three) times daily as needed for muscle spasms. (Patient not taking: Reported on 12/27/2019)   No facility-administered encounter medications on file as of 12/27/2019.    Allergies (verified) Other   History: Past Medical History:  Diagnosis Date  . Arthritis   . Basal cell carcinoma 04/29/2018   upper mid back, right mid back   . Basal cell carcinoma 04/29/2018   right mid back   . Basal cell carcinoma 06/10/2018   left lover back   . Former heavy cigarette smoker (20-39 per day) 03/05/2018   Quit 2000; 60 pak  year history  . HTN (hypertension)    Past Surgical History:  Procedure Laterality Date  . LEFT HEART CATH AND CORONARY ANGIOGRAPHY N/A 06/15/2017   Procedure: LEFT HEART CATH AND CORONARY ANGIOGRAPHY;  Surgeon: Nelva Bush, MD;  Location: [facility];  Service: Cardiovascular;  Laterality: N/A;  . TONSILLECTOMY    . UMBILICAL HERNIA REPAIR N/A 11/03/2017   Procedure: UMBILICAL HERNIA REPAIR;  Surgeon: Clovis Riley, MD;  Location: [facility];  Service: General;  Laterality: N/A;   Family History  Problem Relation Age of Onset  . Dementia Mother        alcohol  . Alcohol abuse Mother   . Heart attack Father   . Heart disease Father   . COPD Father   . CAD Brother   . Healthy Daughter   . Healthy Son   . Healthy Son    Social History   Socioeconomic History  . Marital status: Married    Spouse name: Not on file  . Number of children: 3  . Years of education: Not on file  . Highest education level: Not on file  Occupational History  . Occupation: disabled  Tobacco Use  . Smoking status: Former Smoker    Packs/day: 2.00    Years: 30.00    Pack years: 60.00    Types: Cigarettes    Quit date: 06/16/1998    Years since quitting: 21.5  . Smokeless tobacco:  Never Used  Vaping Use  . Vaping Use: Never used  Substance and Sexual Activity  . Alcohol use: No    Comment: sober 08-28-2017  . Drug use: No  . Sexual activity: Yes    Partners: Female  Other Topics Concern  . Not on file  Social History Narrative  . Not on file   Social Determinants of Health   Financial Resource Strain: Low Risk   . Difficulty of Paying Living Expenses: Not hard at all  Food Insecurity: No Food Insecurity  . Worried About Charity fundraiser in the Last Year: Never true  . Ran Out of Food in the Last Year: Never true  Transportation Needs: No Transportation Needs  . Lack of Transportation (Medical): No  . Lack of Transportation (Non-Medical): No  Physical Activity:  Sufficiently Active  . Days of Exercise per Week: 5 days  . Minutes of Exercise per Session: 60 min  Stress: No Stress Concern Present  . Feeling of Stress : Not at all  Social Connections:   . Frequency of Communication with Friends and Family:   . Frequency of Social Gatherings with Friends and Family:   . Attends Religious Services:   . Active Member of Clubs or Organizations:   . Attends Archivist Meetings:   Marland Kitchen Marital Status:    Tobacco Counseling Counseling given: Not Answered   Clinical Intake:  Pre-visit preparation completed: Yes  Pain : No/denies pain     Nutritional Status: BMI > 30  Obese Nutritional Risks: None Diabetes: No  How often do you need to have someone help you when you read instructions, pamphlets, or other written materials from your doctor or pharmacy?: 1 - Never  Interpreter Needed?: No  Information entered by :: Caroleen Hamman LPN  Activities of Daily Living In your present state of health, do you have any difficulty performing the following activities: 12/27/2019  Hearing? Y  Vision? N  Difficulty concentrating or making decisions? N  Walking or climbing stairs? N  Dressing or bathing? N  Doing errands, shopping? N  Preparing Food and eating ? N  Using the Toilet? N  In the past six months, have you accidently leaked urine? N  Do you have problems with loss of bowel control? N  Managing your Medications? N  Managing your Finances? N  Housekeeping or managing your Housekeeping? N  Some recent data might be hidden     Immunizations and Health Maintenance Immunization History  Administered Date(s) Administered  . Influenza, Quadrivalent, Recombinant, Inj, Pf 02/11/2019  . Influenza,inj,Quad PF,6+ Mos 03/17/2014, 06/02/2017, 03/05/2018  . Influenza,inj,quad, With Preservative 05/16/2017  . PFIZER SARS-COV-2 Vaccination 07/18/2019, 08/08/2019  . Tdap 06/16/2016  . Zoster Recombinat (Shingrix) 09/01/2016, 06/02/2017    Health Maintenance Due  Topic Date Due  . PNA vac Low Risk Adult (1 of 2 - PCV13) Never done    Patient Care Team: Delorse Limber as PCP - General (Family Medicine) Nahser, Wonda Cheng, MD as Consulting Physician (Cardiology) Paralee Cancel, MD as Consulting Physician (Orthopedic Surgery)  Indicate any recent Medical Services you may have received from other than [facility] providers in the past year (date may be approximate).    Assessment:   This is a routine wellness examination for Quency.  Hearing/Vision screen  Hearing Screening   125Hz  250Hz  500Hz  1000Hz  2000Hz  3000Hz  4000Hz  6000Hz  8000Hz   Right ear:           Left ear:  Comments: Hearing loss-patient states he has hearing aids but they do not work well  Vision Screening Comments: Wears glasses- last eye exam -2021-[facility]  Dietary issues and exercise activities discussed: Current Exercise Habits: Home exercise routine, Type of exercise: strength training/weights (Biking), Time (Minutes): 60, Frequency (Times/Week): 5, Weekly Exercise (Minutes/Week): 300  Goals Addressed            This Visit's Progress   . Patient Stated       Maintain current healthy eating plan & exercise      Depression Screen PHQ 2/9 Scores 12/27/2019 10/26/2018 06/11/2017 05/27/2017  PHQ - 2 Score 0 0 0 0  PHQ- 9 Score - 0 - -    Fall Risk Fall Risk  12/27/2019 10/26/2018  Falls in the past year? 0 0  Number falls in past yr: 0 0  Injury with Fall? 0 0  Follow up Falls prevention discussed Falls evaluation completed    FALL RISK PREVENTION PERTAINING TO THE HOME:  Any stairs in or around the home? Yes  If so, are there any without handrails? No   Home free of loose throw rugs in walkways, pet beds, electrical cords, etc? Yes  Adequate lighting in your home to reduce risk of falls? Yes   ASSISTIVE DEVICES UTILIZED TO PREVENT FALLS:  Life alert? No  Use of a cane, walker or w/c? No  Grab bars in the bathroom? No  Shower  chair or bench in shower? No  Elevated toilet seat or a handicapped toilet? No    TIMED UP AND GO:  Was the test performed? Yes .  Length of time to ambulate 10 feet: 9 sec.   GAIT:  Appearance of gait: Gait steady and fast /without the use of an assistive device. Education: Fall risk prevention has been discussed.  Intervention(s) required? No   DME/home health order needed?  No    Cognitive Function: No cognitive impairment noted       Screening Tests Health Maintenance  Topic Date Due  . PNA vac Low Risk Adult (1 of 2 - PCV13) Never done  . DTAP VACCINES (1) 12/28/2029 (Originally 05/08/1956)  . INFLUENZA VACCINE  01/15/2020  . Fecal DNA (Cologuard)  05/06/2021  . DTaP/Tdap/Td (2 - Td or Tdap) 06/16/2026  . TETANUS/TDAP  06/16/2026  . COVID-19 Vaccine  Completed  . Hepatitis C Screening  Completed  . HIV Screening  Completed    Qualifies for Shingles Vaccine? Up to date.  Tdap: Up to date  Flu Vaccine: Due 02/2020  Pneumococcal Vaccine: Due for Pneumococcal vaccine.  Education has been provided regarding the importance of this vaccine . Patient plans to receive vaccine at his next office visit.  Covid-19 Vaccine: Completed vaccines  Cancer Screenings:  Colorectal Screening: Completed Cologuard 05/06/2018. Repeat every 3 years  Lung Cancer Screening: (Low Dose CT Chest recommended if Age 42-80 years, 30 pack-year currently smoking OR have quit w/in 15years.) does not qualify.    Additional Screening:  Hepatitis C Screening:  Completed 05/27/2017  Vision Screening: Recommended annual ophthalmology exams for early detection of glaucoma and other disorders of the eye. Is the patient up to date with their annual eye exam?  Yes  Who is the provider or what is the name of the office in which the pt attends annual eye exams? Unsure of name   Dental Screening: Recommended annual dental exams for proper oral hygiene  Community Resource Referral:  CRR required  this visit?  No  Plan:  I have personally reviewed and addressed the Medicare Annual Wellness questionnaire and have noted the following in the patient's chart:  A. Medical and social history B. Use of alcohol, tobacco or illicit drugs  C. Current medications and supplements D. Functional ability and status E.  Nutritional status F.  Physical activity G. Advance directives H. List of other physicians I.  Hospitalizations, surgeries, and ER visits in previous 12 months J.  [place] such as hearing and vision if needed, cognitive and depression L. Referrals and appointments   In addition, I have reviewed and discussed with patient certain preventive protocols, quality metrics, and best practice recommendations. A written personalized care plan for preventive services as well as general preventive health recommendations were provided to patient.   Signed,    Marta Antu, LPN   [id number]  Nurse Health Advisor   Nurse Notes: Patient states he has been having some issues with anxiety. Appointment made with PCP for 12/29/19 @ 8am.

## 2019-12-27 ENCOUNTER — Other Ambulatory Visit: Payer: Self-pay | Admitting: Family Medicine

## 2019-12-27 ENCOUNTER — Telehealth: Payer: Self-pay | Admitting: Family Medicine

## 2019-12-27 ENCOUNTER — Other Ambulatory Visit: Payer: Self-pay

## 2019-12-27 ENCOUNTER — Ambulatory Visit (INDEPENDENT_AMBULATORY_CARE_PROVIDER_SITE_OTHER): Payer: Medicare HMO

## 2019-12-27 VITALS — BP 118/78 | HR 86 | Resp 16 | Ht 66.0 in | Wt 242.8 lb

## 2019-12-27 DIAGNOSIS — Z0000 Encounter for general adult medical examination without abnormal findings: Secondary | ICD-10-CM

## 2019-12-27 DIAGNOSIS — M170 Bilateral primary osteoarthritis of knee: Secondary | ICD-10-CM

## 2019-12-27 NOTE — Telephone Encounter (Signed)
Pt needs refill of Meloxicam, pharmacy is stating we refused to refill. He would like 30 instead of 15 if possible  I advised him you had some concerns about his long term usage of this medicine. He is willing to discuss as he was unaware ( visit needed ?) but is happy with the results it has provided him, especially for his knees.

## 2019-12-27 NOTE — Patient Instructions (Signed)
[name] , Thank you for taking time to come for your Medicare Wellness Visit. I appreciate your ongoing commitment to your health goals. Please review the following plan we discussed and let me know if I can assist you in the future.   Screening recommendations/referrals: Colonoscopy: Cologuard completed 05/06/2018 Due 05/06/2021 Recommended yearly ophthalmology/optometry visit for glaucoma screening and checkup Recommended yearly dental visit for hygiene and checkup  Vaccinations: Influenza vaccine: Due 02/2020 Pneumococcal vaccine: Due-Discuss with PCP at next office visit Tdap vaccine: Up to date Due 2028 Shingles vaccine: Completed vaccines  Covid-19: Completed vaccines  Advanced directives: Discussed. Please bring a copy of Samuel [name] when completed.  Conditions/risks identified: See problem list  Next appointment: Follow up in one year for your annual wellness visit.   Preventive Care 38 Years and Older, Male Preventive care refers to lifestyle choices and visits with your health care provider that can promote health and wellness. What does preventive care include?  A yearly physical exam. This is also called an annual well check.  Dental exams once or twice a year.  Routine eye exams. Ask your health care provider how often you should have your eyes checked.  Personal lifestyle choices, including:  Daily care of your teeth and gums.  Regular physical activity.  Eating a healthy diet.  Avoiding tobacco and drug use.  Limiting alcohol use.  Practicing safe sex.  Taking low doses of aspirin every day.  Taking vitamin and mineral supplements as recommended by your health care provider. What happens during an annual well check? The services and screenings done by your health care provider during your annual well check will depend on your age, overall health, lifestyle risk factors, and family history of disease. Counseling  Your  health care provider may ask you questions about your:  Alcohol use.  Tobacco use.  Drug use.  Emotional well-being.  Home and relationship well-being.  Sexual activity.  Eating habits.  History of falls.  Memory and ability to understand (cognition).  Work and work Statistician. Screening  You may have the following tests or measurements:  Height, weight, and BMI.  Blood pressure.  Lipid and cholesterol levels. These may be checked every 5 years, or more frequently if you are over 24 years old.  Skin check.  Lung cancer screening. You may have this screening every year starting at age 27 if you have a 30-pack-year history of smoking and currently smoke or have quit within the past 15 years.  Fecal occult blood test (FOBT) of the stool. You may have this test every year starting at age 96.  Flexible sigmoidoscopy or colonoscopy. You may have a sigmoidoscopy every 5 years or a colonoscopy every 10 years starting at age 52.  Prostate cancer screening. Recommendations will vary depending on your family history and other risks.  Hepatitis C blood test.  Hepatitis B blood test.  Sexually transmitted disease (STD) testing.  Diabetes screening. This is done by checking your blood sugar (glucose) after you have not eaten for a while (fasting). You may have this done every 1-3 years.  Abdominal aortic aneurysm (AAA) screening. You may need this if you are a current or former smoker.  Osteoporosis. You may be screened starting at age 37 if you are at high risk. Talk with your health care provider about your test results, treatment options, and if necessary, the need for more tests. Vaccines  Your health care provider may recommend certain vaccines, such as:  Influenza vaccine. This is recommended every year.  Tetanus, diphtheria, and acellular pertussis (Tdap, Td) vaccine. You may need a Td booster every 10 years.  Zoster vaccine. You may need this after age  7.  Pneumococcal 13-valent conjugate (PCV13) vaccine. One dose is recommended after age 54.  Pneumococcal polysaccharide (PPSV23) vaccine. One dose is recommended after age 41. Talk to your health care provider about which screenings and vaccines you need and how often you need them. This information is not intended to replace advice given to you by your health care provider. Make sure you discuss any questions you have with your health care provider. Document Released: 06/29/2015 Document Revised: 02/20/2016 Document Reviewed: 04/03/2015 Elsevier Interactive Patient Education  2017 [place] Prevention in the Home Falls can cause injuries. They can happen to people of all ages. There are many things you can do to make your home safe and to help prevent falls. What can I do on the outside of my home?  Regularly fix the edges of walkways and driveways and fix any cracks.  Remove anything that might make you trip as you walk through a door, such as a raised step or threshold.  Trim any bushes or trees on the path to your home.  Use bright outdoor lighting.  Clear any walking paths of anything that might make someone trip, such as rocks or tools.  Regularly check to see if handrails are loose or broken. Make sure that both sides of any steps have handrails.  Any raised decks and porches should have guardrails on the edges.  Have any leaves, snow, or ice cleared regularly.  Use sand or salt on walking paths during winter.  Clean up any spills in your garage right away. This includes oil or grease spills. What can I do in the bathroom?  Use night lights.  Install grab bars by the toilet and in the tub and shower. Do not use towel bars as grab bars.  Use non-skid mats or decals in the tub or shower.  If you need to sit down in the shower, use a plastic, non-slip stool.  Keep the floor dry. Clean up any water that spills on the floor as soon as it happens.  Remove  soap buildup in the tub or shower regularly.  Attach bath mats securely with double-sided non-slip rug tape.  Do not have throw rugs and other things on the floor that can make you trip. What can I do in the bedroom?  Use night lights.  Make sure that you have a light by your bed that is easy to reach.  Do not use any sheets or blankets that are too big for your bed. They should not hang down onto the floor.  Have a firm chair that has side arms. You can use this for support while you get dressed.  Do not have throw rugs and other things on the floor that can make you trip. What can I do in the kitchen?  Clean up any spills right away.  Avoid walking on wet floors.  Keep items that you use a lot in easy-to-reach places.  If you need to reach something above you, use a strong step stool that has a grab bar.  Keep electrical cords out of the way.  Do not use floor polish or wax that makes floors slippery. If you must use wax, use non-skid floor wax.  Do not have throw rugs and other things on the floor that  can make you trip. What can I do with my stairs?  Do not leave any items on the stairs.  Make sure that there are handrails on both sides of the stairs and use them. Fix handrails that are broken or loose. Make sure that handrails are as long as the stairways.  Check any carpeting to make sure that it is firmly attached to the stairs. Fix any carpet that is loose or worn.  Avoid having throw rugs at the top or bottom of the stairs. If you do have throw rugs, attach them to the floor with carpet tape.  Make sure that you have a light switch at the top of the stairs and the bottom of the stairs. If you do not have them, ask someone to add them for you. What else can I do to help prevent falls?  Wear shoes that:  Do not have high heels.  Have rubber bottoms.  Are comfortable and fit you well.  Are closed at the toe. Do not wear sandals.  If you use a  stepladder:  Make sure that it is fully opened. Do not climb a closed stepladder.  Make sure that both sides of the stepladder are locked into place.  Ask someone to hold it for you, if possible.  Clearly mark and make sure that you can see:  Any grab bars or handrails.  First and last steps.  Where the edge of each step is.  Use tools that help you move around (mobility aids) if they are needed. These include:  Canes.  Walkers.  Scooters.  Crutches.  Turn on the lights when you go into a dark area. Replace any light bulbs as soon as they burn out.  Set up your furniture so you have a clear path. Avoid moving your furniture around.  If any of your floors are uneven, fix them.  If there are any pets around you, be aware of where they are.  Review your medicines with your doctor. Some medicines can make you feel dizzy. This can increase your chance of falling. Ask your doctor what other things that you can do to help prevent falls. This information is not intended to replace advice given to you by your health care provider. Make sure you discuss any questions you have with your health care provider. Document Released: 03/29/2009 Document Revised: 11/08/2015 Document Reviewed: 07/07/2014 Elsevier Interactive Patient Education  2017 [employer].

## 2019-12-28 MED ORDER — MELOXICAM 15 MG PO TABS: 15.0000 mg | ORAL_TABLET | Freq: Every day | ORAL | 1 refills | Status: DC | PRN

## 2019-12-28 NOTE — Telephone Encounter (Signed)
Medicine sent 

## 2019-12-28 NOTE — Telephone Encounter (Signed)
Left msg for pt, advised meds called in. Advised him to call back if he would like to schedule an appt to discuss long term use of this med.

## 2019-12-29 ENCOUNTER — Ambulatory Visit (INDEPENDENT_AMBULATORY_CARE_PROVIDER_SITE_OTHER): Payer: Medicare HMO | Admitting: Physician Assistant

## 2019-12-29 ENCOUNTER — Other Ambulatory Visit: Payer: Self-pay

## 2019-12-29 ENCOUNTER — Encounter: Payer: Self-pay | Admitting: Physician Assistant

## 2019-12-29 VITALS — BP 110/70 | HR 70 | Temp 98.5°F | Resp 16 | Ht 66.0 in | Wt 242.0 lb

## 2019-12-29 DIAGNOSIS — B351 Tinea unguium: Secondary | ICD-10-CM

## 2019-12-29 DIAGNOSIS — F411 Generalized anxiety disorder: Secondary | ICD-10-CM

## 2019-12-29 DIAGNOSIS — Z23 Encounter for immunization: Secondary | ICD-10-CM | POA: Diagnosis not present

## 2019-12-29 LAB — HEPATIC FUNCTION PANEL
ALT: 20 U/L (ref 0–53)
AST: 12 U/L (ref 0–37)
Albumin: 4.5 g/dL (ref 3.5–5.2)
Alkaline Phosphatase: 62 U/L (ref 39–117)
Bilirubin, Direct: 0.1 mg/dL (ref 0.0–0.3)
Total Bilirubin: 0.3 mg/dL (ref 0.2–1.2)
Total Protein: 6.8 g/dL (ref 6.0–8.3)

## 2019-12-29 MED ORDER — FLUOXETINE HCL 10 MG PO CAPS: ORAL_CAPSULE | ORAL | 0 refills | Status: DC

## 2019-12-29 MED ORDER — TERBINAFINE HCL 250 MG PO TABS: 250.0000 mg | ORAL_TABLET | Freq: Every day | ORAL | 1 refills | Status: DC

## 2019-12-29 MED ORDER — FLUOXETINE HCL 20 MG PO TABS: ORAL_TABLET | ORAL | 3 refills | Status: DC

## 2019-12-29 MED ORDER — FLUOXETINE HCL 20 MG PO CAPS: 20.0000 mg | ORAL_CAPSULE | Freq: Every day | ORAL | 2 refills | Status: DC

## 2019-12-29 NOTE — Progress Notes (Signed)
Patient presents to clinic today to discuss multiple concerns.   Patient endorses having a history of onychomycosis of great toes bilaterally as well as in some of his other toenails of the R foot. Notes the areas were successfully treated a few years ago with Lamisil which he notes tolerating well. States over the past year has noted a recurrence of nail changes of the R great toenail -- thickening, yellowing and brittleness. Is wanting this assessed today and to see if treatment can be restarted.   Patient also notes over the past year or so having an increase in overall levels of anxiety culminating in more episodes of irritability. Denies true depressed mood but has noted some anhedonia which he feels is 2/2 the anxiety. Is currently on a BZD used on rare as needed basis for acute anxiety/panic attack. Is wanting to discuss potentially starting a daily medication.   Past Medical History:  Diagnosis Date  . Arthritis   . Basal cell carcinoma 04/29/2018   upper mid back, right mid back   . Basal cell carcinoma 04/29/2018   right mid back   . Basal cell carcinoma 06/10/2018   left lover back   . Former heavy cigarette smoker (20-39 per day) 03/05/2018   Quit 2000; 60 pak year history  . HTN (hypertension)     Current Outpatient Medications on File Prior to Visit  Medication Sig Dispense Refill  . amLODipine (NORVASC) 5 MG tablet Take 1 tablet (5 mg total) by mouth daily. 90 tablet 1  . meloxicam (MOBIC) 15 MG tablet Take 1 tablet (15 mg total) by mouth daily as needed for pain. Use sparingly 30 tablet 1   No current facility-administered medications on file prior to visit.    Allergies  Allergen Reactions  . Other Itching and Other (See Comments)    Opioids - "scratch my face off"     Family History  Problem Relation Age of Onset  . Dementia Mother        alcohol  . Alcohol abuse Mother   . Heart attack Father   . Heart disease Father   . COPD Father   . CAD Brother   .  Healthy Daughter   . Healthy Son   . Healthy Son     Social History   Socioeconomic History  . Marital status: Married    Spouse name: Not on file  . Number of children: 3  . Years of education: Not on file  . Highest education level: Not on file  Occupational History  . Occupation: disabled  Tobacco Use  . Smoking status: Former Smoker    Packs/day: 2.00    Years: 30.00    Pack years: 60.00    Types: Cigarettes    Quit date: 06/16/1998    Years since quitting: 21.5  . Smokeless tobacco: Never Used  Vaping Use  . Vaping Use: Never used  Substance and Sexual Activity  . Alcohol use: No    Comment: sober 08-28-2017  . Drug use: No  . Sexual activity: Yes    Partners: Female  Other Topics Concern  . Not on file  Social History Narrative  . Not on file   Social Determinants of Health   Financial Resource Strain: Low Risk   . Difficulty of Paying Living Expenses: Not hard at all  Food Insecurity: No Food Insecurity  . Worried About Charity fundraiser in the Last Year: Never true  . Ran Out of Food in  the Last Year: Never true  Transportation Needs: No Transportation Needs  . Lack of Transportation (Medical): No  . Lack of Transportation (Non-Medical): No  Physical Activity: Sufficiently Active  . Days of Exercise per Week: 5 days  . Minutes of Exercise per Session: 60 min  Stress: No Stress Concern Present  . Feeling of Stress : Not at all  Social Connections:   . Frequency of Communication with Friends and Family:   . Frequency of Social Gatherings with Friends and Family:   . Attends Religious Services:   . Active Member of Clubs or Organizations:   . Attends Archivist Meetings:   Marland Kitchen Marital Status:    Review of Systems - See HPI.  All other ROS are negative.  Wt 242 lb (109.8 kg)   BMI 39.06 kg/m   Physical Exam Vitals reviewed.  Constitutional:      Appearance: Normal appearance.  HENT:     Head: Normocephalic and atraumatic.   Cardiovascular:     Rate and Rhythm: Normal rate and regular rhythm.     Heart sounds: Normal heart sounds.  Musculoskeletal:     Cervical back: Neck supple.  Feet:     Right foot:     Toenail Condition: Right toenails are abnormally thick and long. Fungal disease present. Neurological:     General: No focal deficit present.     Mental Status: He is alert and oriented to person, place, and time.  Psychiatric:        Mood and Affect: Mood normal.     Assessment/Plan: 1. Anxiety state Will start him on daily Fluoxetine. Insurance will only cover capsules so will start him out at 10 mg capsule daily x 2 weeks before increasing to 20 mg capsule daily. Close follow-up scheduled. He is to continue AA and counseling sessions.  - FLUoxetine (PROZAC) 10 MG capsule; Take 1 capsule by mouth daily for 2 weeks then increase to 20 mg daily  Dispense: 14 capsule; Refill: 0 - FLUoxetine (PROZAC) 20 MG capsule; Take 1 capsule (20 mg total) by mouth daily.  Dispense: 30 capsule; Refill: 2  2. Onychomycosis Home measures reviewed. Will check LFTs today. If normal will start Terbinafine 250 mg daily, having follow-up in 4-6 weeks to reassess LFTs. - Hepatic function panel  3. Need for vaccination against Streptococcus pneumoniae using pneumococcal conjugate vaccine 13 - Pneumococcal conjugate vaccine 13-valent IM  This visit occurred during the SARS-CoV-2 public health emergency.  Safety protocols were in place, including screening questions prior to the visit, additional usage of staff PPE, and extensive cleaning of exam room while observing appropriate contact time as indicated for disinfecting solutions.     Leeanne Rio, PA-C

## 2019-12-29 NOTE — Patient Instructions (Signed)
Please go to the lab today for blood work.  I will call you with your results. We will alter treatment regimen(s) if indicated by your results.   Your Pneumonia vaccine was updated today. You will be due for the other vaccine at 64.   Please take the Fluoxetine as directed.  Keep up with your support group.  If liver function is stable we will start Lamisil for your nails.  Do the vinegar soaks as directed.   We will follow-up in 6 weeks.  Sooner if needed.   Take care!

## 2020-01-05 ENCOUNTER — Telehealth: Payer: Self-pay | Admitting: Orthopaedic Surgery

## 2020-01-05 NOTE — Telephone Encounter (Signed)
Can you please make sure we have auth?

## 2020-01-05 NOTE — Telephone Encounter (Signed)
Noted  

## 2020-01-05 NOTE — Telephone Encounter (Signed)
Patient called needing to get the Visco injections again. The number to contact patient is [phone]

## 2020-01-05 NOTE — Telephone Encounter (Signed)
Patient called advised he was billed 3 separate times for an injection. Patient said he was billed 08/08/2019 for Visco. 08/15/2019 patient said he was billed for Euflexxa an 08/22/2019 he was billed again for Euflexxa. Patient said he talked to the billing department several times and was told to contact the billing department at [facility]. The number patient called for the billing department [phone]. The number to contact patient is [phone]

## 2020-01-06 NOTE — Telephone Encounter (Signed)
Will submit in August, 2021. Last Eufleexa injection was 08/22/2019. Patient is not able to receive another gel injection until after 02/22/2020.

## 2020-01-08 ENCOUNTER — Other Ambulatory Visit: Payer: Self-pay | Admitting: Physician Assistant

## 2020-01-08 DIAGNOSIS — F411 Generalized anxiety disorder: Secondary | ICD-10-CM

## 2020-01-17 DIAGNOSIS — R32 Unspecified urinary incontinence: Secondary | ICD-10-CM | POA: Diagnosis not present

## 2020-01-26 ENCOUNTER — Telehealth: Payer: Self-pay

## 2020-01-26 NOTE — Telephone Encounter (Signed)
Submitted VOB, Orthovisc, left knee.

## 2020-01-30 ENCOUNTER — Telehealth: Payer: Self-pay

## 2020-01-30 NOTE — Telephone Encounter (Signed)
Called and left a VM for patient to call back to schedule an appointment after 02/22/2020 with Dr.Blackman or Artis Delay for gel injection.  Approved, Orthovisc series, left knee. Buy & Bill Covered at 100% through his insurance. No Co-pay PA required PA Approval# [id number] Valid 01/30/2020- 08/01/2020

## 2020-02-06 DIAGNOSIS — R69 Illness, unspecified: Secondary | ICD-10-CM | POA: Diagnosis not present

## 2020-02-16 DIAGNOSIS — R32 Unspecified urinary incontinence: Secondary | ICD-10-CM | POA: Diagnosis not present

## 2020-02-23 ENCOUNTER — Ambulatory Visit: Payer: Medicare HMO | Admitting: Orthopaedic Surgery

## 2020-03-05 ENCOUNTER — Encounter: Payer: Self-pay | Admitting: Orthopaedic Surgery

## 2020-03-05 ENCOUNTER — Ambulatory Visit (INDEPENDENT_AMBULATORY_CARE_PROVIDER_SITE_OTHER): Payer: Medicare HMO | Admitting: Orthopaedic Surgery

## 2020-03-05 DIAGNOSIS — M1712 Unilateral primary osteoarthritis, left knee: Secondary | ICD-10-CM | POA: Diagnosis not present

## 2020-03-05 MED ORDER — HYALURONAN 30 MG/2ML IX SOSY
30.0000 mg | PREFILLED_SYRINGE | INTRA_ARTICULAR | Status: AC | PRN
  Administered 2020-03-05: 30 mg via INTRA_ARTICULAR

## 2020-03-05 NOTE — Progress Notes (Signed)
   Procedure Note  Patient: Samuel [name]             Date of Birth: Mar 05, 1956           MRN: [id number]             Visit Date: 03/05/2020  Procedures: Visit Diagnoses:  1. Unilateral primary osteoarthritis, left knee     Large Joint Inj on 03/05/2020 9:08 AM Indications: diagnostic evaluation and pain Details: 22 G 1.5 in needle, superolateral approach  Arthrogram: No  Medications: 30 mg Hyaluronan 30 MG/2ML Outcome: tolerated well, no immediate complications Procedure, treatment alternatives, risks and benefits explained, specific risks discussed. Consent was given by the patient. Immediately prior to procedure a time out was called to verify the correct patient, procedure, equipment, support staff and site/side marked as required. Patient was prepped and draped in the usual sterile fashion.     The patient is here for a series of Orthovisc injections with hyaluronic acid into the left knee to treat the pain from osteoarthritis.  This is injection #1 of a series of 3 injections.  He has had these in the past and they have worked for him where steroids have not.  He said no other acute changes in medical status.  His left knee shows no effusion with varus malalignment.  I placed course of this #1 in his left knee without difficulty.  We will see him back next week for injection #2.  All question concerns were answered and addressed.

## 2020-03-12 ENCOUNTER — Encounter: Payer: Self-pay | Admitting: Orthopaedic Surgery

## 2020-03-12 ENCOUNTER — Ambulatory Visit (INDEPENDENT_AMBULATORY_CARE_PROVIDER_SITE_OTHER): Payer: Medicare HMO | Admitting: Orthopaedic Surgery

## 2020-03-12 DIAGNOSIS — M1712 Unilateral primary osteoarthritis, left knee: Secondary | ICD-10-CM | POA: Diagnosis not present

## 2020-03-12 MED ORDER — HYALURONAN 30 MG/2ML IX SOSY
30.0000 mg | PREFILLED_SYRINGE | INTRA_ARTICULAR | Status: AC | PRN
  Administered 2020-03-12: 30 mg via INTRA_ARTICULAR

## 2020-03-12 NOTE — Progress Notes (Signed)
   Procedure Note  Patient: Samuel [name]             Date of Birth: [birth year]           MRN: [id number]             Visit Date: 03/12/2020  Procedures: Visit Diagnoses:  1. Unilateral primary osteoarthritis, left knee     Large Joint Inj: L knee on 03/12/2020 9:18 AM Indications: diagnostic evaluation and pain Details: 22 G 1.5 in needle, superolateral approach  Arthrogram: No  Medications: 30 mg Hyaluronan 30 MG/2ML Outcome: tolerated well, no immediate complications Procedure, treatment alternatives, risks and benefits explained, specific risks discussed. Consent was given by the patient. Immediately prior to procedure a time out was called to verify the correct patient, procedure, equipment, support staff and site/side marked as required. Patient was prepped and draped in the usual sterile fashion.     The patient is here for injection #2 of a series of 3 Orthovisc injections for his left knee.  He had no bad reaction to injection #1 is doing well overall.  I did place injection #2 in his left knee today without difficulty.  All questions and concerns were answered and addressed.  I will see him back next week for injection #3 and to the left knee of Orthovisc.  This is to treat osteoarthritis pain.

## 2020-03-16 DIAGNOSIS — R32 Unspecified urinary incontinence: Secondary | ICD-10-CM | POA: Diagnosis not present

## 2020-03-19 ENCOUNTER — Ambulatory Visit: Payer: Medicare HMO | Admitting: Orthopaedic Surgery

## 2020-03-21 ENCOUNTER — Ambulatory Visit (INDEPENDENT_AMBULATORY_CARE_PROVIDER_SITE_OTHER): Payer: Medicare HMO | Admitting: Physician Assistant

## 2020-03-21 ENCOUNTER — Other Ambulatory Visit: Payer: Self-pay

## 2020-03-21 ENCOUNTER — Encounter: Payer: Self-pay | Admitting: Physician Assistant

## 2020-03-21 VITALS — BP 128/74 | HR 66 | Temp 97.4°F | Resp 16 | Wt 239.0 lb

## 2020-03-21 DIAGNOSIS — Z23 Encounter for immunization: Secondary | ICD-10-CM | POA: Diagnosis not present

## 2020-03-21 DIAGNOSIS — M5441 Lumbago with sciatica, right side: Secondary | ICD-10-CM

## 2020-03-21 MED ORDER — METHYLPREDNISOLONE 4 MG PO TBPK: ORAL_TABLET | ORAL | 0 refills | Status: DC

## 2020-03-21 NOTE — Progress Notes (Signed)
Patient presents to clinic today c/o recurrence of right-sided low back pain over the past several weeks, now with radiation into the right lower extremity.  Denies weakness, numbness or tingling.  Denies any recent trauma or injury.  Notes he does try to stay very active.  Notes he had a cortisone shot several months ago by sports medicine and was told it would wear off in 3 to 4 months.  Try to get in with a sports medicine provider but was told they moved her practice in [facility].  Is wanting a referral to a new provider in the area..   Past Medical History:  Diagnosis Date  . Arthritis   . Basal cell carcinoma 04/29/2018   upper mid back, right mid back   . Basal cell carcinoma 04/29/2018   right mid back   . Basal cell carcinoma 06/10/2018   left lover back   . Former heavy cigarette smoker (20-39 per day) 03/05/2018   Quit 2000; 60 pak year history  . HTN (hypertension)     Current Outpatient Medications on File Prior to Visit  Medication Sig Dispense Refill  . amLODipine (NORVASC) 5 MG tablet Take 1 tablet (5 mg total) by mouth daily. 90 tablet 1  . FLUoxetine (PROZAC) 10 MG capsule Take 1 capsule by mouth daily for 2 weeks then increase to 20 mg daily 14 capsule 0  . FLUoxetine (PROZAC) 20 MG capsule Take 1 capsule (20 mg total) by mouth daily. 30 capsule 2  . meloxicam (MOBIC) 15 MG tablet Take 1 tablet (15 mg total) by mouth daily as needed for pain. Use sparingly 30 tablet 1  . terbinafine (LAMISIL) 250 MG tablet Take 1 tablet (250 mg total) by mouth daily. (Patient not taking: Reported on 03/21/2020) 30 tablet 1   No current facility-administered medications on file prior to visit.    Allergies  Allergen Reactions  . Other Itching and Other (See Comments)    Opioids - "scratch my face off"     Family History  Problem Relation Age of Onset  . Dementia Mother        alcohol  . Alcohol abuse Mother   . Heart attack Father   . Heart disease Father   . COPD Father    . CAD Brother   . Healthy Daughter   . Healthy Son   . Healthy Son     Social History   Socioeconomic History  . Marital status: Married    Spouse name: Not on file  . Number of children: 3  . Years of education: Not on file  . Highest education level: Not on file  Occupational History  . Occupation: disabled  Tobacco Use  . Smoking status: Former Smoker    Packs/day: 2.00    Years: 30.00    Pack years: 60.00    Types: Cigarettes    Quit date: 06/16/1998    Years since quitting: 21.7  . Smokeless tobacco: Never Used  Vaping Use  . Vaping Use: Never used  Substance and Sexual Activity  . Alcohol use: No    Comment: sober 08-28-2017  . Drug use: No  . Sexual activity: Yes    Partners: Female  Other Topics Concern  . Not on file  Social History Narrative  . Not on file   Social Determinants of Health   Financial Resource Strain: Low Risk   . Difficulty of Paying Living Expenses: Not hard at all  Food Insecurity: No Food Insecurity  .  Worried About Charity fundraiser in the Last Year: Never true  . Ran Out of Food in the Last Year: Never true  Transportation Needs: No Transportation Needs  . Lack of Transportation (Medical): No  . Lack of Transportation (Non-Medical): No  Physical Activity: Sufficiently Active  . Days of Exercise per Week: 5 days  . Minutes of Exercise per Session: 60 min  Stress: No Stress Concern Present  . Feeling of Stress : Not at all  Social Connections:   . Frequency of Communication with Friends and Family: Not on file  . Frequency of Social Gatherings with Friends and Family: Not on file  . Attends Religious Services: Not on file  . Active Member of Clubs or Organizations: Not on file  . Attends Archivist Meetings: Not on file  . Marital Status: Not on file   Review of Systems - See HPI.  All other ROS are negative.  BP 128/74   Pulse 66   Temp (!) 97.4 F (36.3 C) (Temporal)   Resp 16   Wt 239 lb (108.4 kg)   SpO2  99%   BMI 38.58 kg/m   Physical Exam Vitals reviewed.  Constitutional:      Appearance: Normal appearance.  HENT:     Head: Normocephalic and atraumatic.  Eyes:     Conjunctiva/sclera: Conjunctivae normal.     Pupils: Pupils are equal, round, and reactive to light.  Cardiovascular:     Rate and Rhythm: Normal rate and regular rhythm.     Pulses: Normal pulses.     Heart sounds: Normal heart sounds.  Pulmonary:     Effort: Pulmonary effort is normal.     Breath sounds: Normal breath sounds.  Musculoskeletal:     Cervical back: Neck supple.     Thoracic back: Normal.     Lumbar back: No swelling, lacerations, spasms, tenderness or bony tenderness. Normal range of motion.  Neurological:     General: No focal deficit present.     Mental Status: He is alert.  Psychiatric:        Mood and Affect: Mood normal.    Recent Results (from the past 2160 hour(s))  Hepatic function panel     Status: None   Collection Time: 12/29/19  8:53 AM  Result Value Ref Range   Total Bilirubin 0.3 0.2 - 1.2 mg/dL   Bilirubin, Direct 0.1 0.0 - 0.3 mg/dL   Alkaline Phosphatase 62 39 - 117 U/L   AST 12 0 - 37 U/L   ALT 20 0 - 53 U/L   Total Protein 6.8 6.0 - 8.3 g/dL   Albumin 4.5 3.5 - 5.2 g/dL    Assessment/Plan: 1. Acute right-sided low back pain with right-sided sciatica Referral to new sports medicine provider placed.  Will start Medrol Dosepak.  Supportive measures, OTC medications and stretching exercises reviewed with patient.  All questions were answered.  Alarm signs and symptoms reviewed with patient.  Follow-up if anything worsens before being evaluated by sports medicine. - methylPREDNISolone (MEDROL DOSEPAK) 4 MG TBPK tablet; Take following package directions  Dispense: 21 tablet; Refill: 0  2. Need for immunization against influenza - Flu Vaccine QUAD High Dose(Fluad)  This visit occurred during the SARS-CoV-2 public health emergency.  Safety protocols were in place, including  screening questions prior to the visit, additional usage of staff PPE, and extensive cleaning of exam room while observing appropriate contact time as indicated for disinfecting solutions.     Sandria Manly  Hassell Done, PA-C

## 2020-03-21 NOTE — Patient Instructions (Addendum)
Please avoid heavy lifting and overexertion.  Take the steroid dose pack as directed.   Please speak to the staff up front regarding getting scheduled with Dr. Hulan Saas. Since he is in the same practice as Dr. Georgina Snell, they should be able to schedule with him.   Hang in there!

## 2020-03-26 ENCOUNTER — Encounter: Payer: Self-pay | Admitting: Orthopaedic Surgery

## 2020-03-26 ENCOUNTER — Ambulatory Visit (INDEPENDENT_AMBULATORY_CARE_PROVIDER_SITE_OTHER): Payer: Medicare HMO | Admitting: Orthopaedic Surgery

## 2020-03-26 DIAGNOSIS — M1712 Unilateral primary osteoarthritis, left knee: Secondary | ICD-10-CM

## 2020-03-26 MED ORDER — HYALURONAN 30 MG/2ML IX SOSY
30.0000 mg | PREFILLED_SYRINGE | INTRA_ARTICULAR | Status: AC | PRN
  Administered 2020-03-26: 30 mg via INTRA_ARTICULAR

## 2020-03-26 NOTE — Progress Notes (Signed)
   Procedure Note  Patient: Samuel [name]             Date of Birth: 1956-02-20           MRN: [id number]             Visit Date: 03/26/2020  Procedures: Visit Diagnoses:  1. Unilateral primary osteoarthritis, left knee     Large Joint Inj on 03/26/2020 9:33 AM Indications: diagnostic evaluation and pain Details: 22 G 1.5 in needle, superolateral approach  Arthrogram: No  Medications: 30 mg Hyaluronan 30 MG/2ML Outcome: tolerated well, no immediate complications Procedure, treatment alternatives, risks and benefits explained, specific risks discussed. Consent was given by the patient. Immediately prior to procedure a time out was called to verify the correct patient, procedure, equipment, support staff and site/side marked as required. Patient was prepped and draped in the usual sterile fashion.    Patient comes in today for injection #3 of a series of 3 injections of Orthovisc to treat the pain from osteoarthritis in his left knee.  He has had no other bad reactions to the other 2 injections and is doing well.  He has had no acute change in medical status.  I provided injection #3 in his left knee without difficulty today.  There was no knee effusion.  He ambulated well afterwards.  He knows that he can have these again in 6 months and he will let us know.  All questions and concerns were answered and addressed.

## 2020-03-30 ENCOUNTER — Other Ambulatory Visit: Payer: Self-pay

## 2020-03-30 ENCOUNTER — Ambulatory Visit (INDEPENDENT_AMBULATORY_CARE_PROVIDER_SITE_OTHER): Payer: Medicare HMO | Admitting: Family Medicine

## 2020-03-30 ENCOUNTER — Encounter: Payer: Self-pay | Admitting: Family Medicine

## 2020-03-30 VITALS — BP 110/78 | HR 65 | Ht 66.0 in | Wt 235.0 lb

## 2020-03-30 DIAGNOSIS — M48061 Spinal stenosis, lumbar region without neurogenic claudication: Secondary | ICD-10-CM | POA: Diagnosis not present

## 2020-03-30 NOTE — Progress Notes (Signed)
Rito Ehrlich, am serving as a Education administrator for Dr. Lynne Leader.  Samuel [name] is a 64 y.o. male who presents to [place] at [facility] today for f/u of chronic LBP and newer radiating pain into the R LE.  He was last seen by Dr. Georgina Snell on 11/23/19 and was referred for an ESI that he had on 12/01/19 (L L3-4).  He has tried Meloxicam in the past which helped.  Since his last visit, pt was seen by Primary Care on 03/21/20 w/ c/o recurrence of R-sided LBP w/ pain radiating into his R LE.  Today, he notes pain has come back but it is still at the early stages and wants to catch it before it gets too bad. Patient states that the epidural helped  A lot. Patient states he has been able to do a lot of work around the house and go to [employer], states that he does not give his back a break.  His pain is mostly located axial back and antibiotics.  Now much pain to the anterior thigh.    Diagnostic testing: Lumbar MRI- 11/19/19; Lumbar XR- 05/02/19   Pertinent review of systems: No fevers or chills  Relevant historical information: Hypertension, hypothyroidism, former heavy smoker and drinker    Exam:  BP 110/78 (BP Location: Left Arm, Patient Position: Sitting, Cuff Size: Large)   Pulse 65   Ht 5\' 6"  (1.676 m)   Wt 235 lb (106.6 kg)   SpO2 99%   BMI 37.93 kg/m  General: Well Developed, well nourished, and in no acute distress.   MSK: L-spine normal-appearing nontender midline.  Tender palpation perispinal musculature.  Normal motion.  Slump test negative bilateral.    Lab and Radiology Results EXAM: MRI LUMBAR SPINE WITHOUT CONTRAST  TECHNIQUE: Multiplanar, multisequence MR imaging of the lumbar spine was performed. No intravenous contrast was administered.  COMPARISON:  Plain films May 02, 2019  FINDINGS: Segmentation:  Standard.  Alignment:  Physiologic.  Vertebrae: No fracture, evidence of discitis, or bone lesion. Mild diffuse decrease of the T1  signal throughout the visualized spine, nonspecific, may represent red marrow reconversion.  Conus medullaris and cauda equina: Conus extends to the L1 level. Conus and cauda equina appear normal.  Paraspinal and other soft tissues: Negative.  Disc levels:  T12-L1: No spinal canal or neural foraminal stenosis.  L1-2: No spinal canal or neural foraminal stenosis.  L2-3: Mild loss of disc height, mild facet degenerative changes with bilateral joint effusion and ligamentum flavum redundancy, resulting in mild spinal canal stenosis and mild bilateral neural foraminal narrowing.  L3-4: Disc bulge with superimposed small left foraminal central disc protrusion abutting the exiting left L3 nerve root. There also facet degenerative change ligamentum flavum redundancy contributing for mild to moderate spinal canal stenosis, narrowing of the subarticular zones and mild bilateral neural foraminal narrowing.  L4-5: Disc bulge, prominent facet degenerative changes ligamentum flavum redundancy resulting in severe spinal canal stenosis and mild bilateral neural foraminal narrowing.  L5-S1: Mild facet degenerative changes. No spinal canal or neural foraminal stenosis.  IMPRESSION: 1. Lumbar spondylosis most pronounced at L4-5 where there is severe spinal canal stenosis and mild bilateral neural foraminal narrowing. 2. At L3-4 there is a small left foraminal central disc protrusion abutting the exiting left L3 nerve root. There is also mild to moderate spinal canal stenosis, narrowing of the subarticular zones and mild bilateral neural foraminal narrowing at this level. 3. Mild spinal canal stenosis and mild bilateral  neural foraminal narrowing at L2-3. 4. Mild diffuse decrease of the T1 signal throughout the visualized spine, nonspecific, may represent red marrow reconversion.   Electronically Signed   By: Pedro Earls M.D.   On: 11/21/2019 09:08  I, Lynne Leader, personally (independently) visualized and performed the interpretation of the images attached in this note.     Assessment and Plan: 64 y.o. male with low back pain extending to buttocks and proximal posterior thighs.  Probably related to axial back pain causes such as facet DJD seen on MRI.  Patient had excellent results with last epidural steroid injection which was interlaminar at L3-4.  His current pain does not necessarily correspond to this level but he had such excellent results that he would like to repeat this injection at this point.  I think that is reasonable.  If this injection does not provide good benefit would try facet injections likely at L4-5 bilaterally.   PDMP not reviewed this encounter. Orders Placed This Encounter  Procedures  . DG INJECT DIAG/THERA/INC NEEDLE/CATH/PLC EPI/LUMB/SAC W/IMG    LUMB EPI #2 L3-4  [employer] 243 LBS PACS (11/19/19) NO THINS/OTC/SCREENED*    Standing Status:   Future    Standing Expiration Date:   03/30/2021    Order Specific Question:   Reason for Exam (SYMPTOM  OR DIAGNOSIS REQUIRED)    Answer:   Repat injection from June. Left Interlaminar injection L3-L4    Order Specific Question:   Preferred Imaging Location?    Answer:   [id number]    Order Specific Question:   Radiology Contrast Protocol - do NOT remove file path    Answer:   [email or web address]   No orders of the defined types were placed in this encounter.    Discussed warning signs or symptoms. Please see discharge instructions. Patient expresses understanding.   The above documentation has been reviewed and is accurate and complete Lynne Leader, M.D.

## 2020-03-30 NOTE — Patient Instructions (Addendum)
Thank you for coming in today.  Please call [facility] at [phone] to schedule your spine injection.   Let me know if the injection does not help.

## 2020-03-31 ENCOUNTER — Other Ambulatory Visit: Payer: Self-pay | Admitting: Physician Assistant

## 2020-03-31 DIAGNOSIS — F411 Generalized anxiety disorder: Secondary | ICD-10-CM

## 2020-04-01 ENCOUNTER — Other Ambulatory Visit: Payer: Self-pay | Admitting: Physician Assistant

## 2020-04-09 ENCOUNTER — Other Ambulatory Visit: Payer: Self-pay

## 2020-04-09 ENCOUNTER — Ambulatory Visit
Admission: RE | Admit: 2020-04-09 | Discharge: 2020-04-09 | Disposition: A | Discharge status: Home / Self Care | Payer: Medicare HMO | Source: Ambulatory Visit | Attending: Family Medicine | Admitting: Family Medicine

## 2020-04-09 DIAGNOSIS — M48061 Spinal stenosis, lumbar region without neurogenic claudication: Secondary | ICD-10-CM

## 2020-04-09 DIAGNOSIS — M545 Low back pain, unspecified: Secondary | ICD-10-CM | POA: Diagnosis not present

## 2020-04-09 MED ORDER — METHYLPREDNISOLONE ACETATE 40 MG/ML INJ SUSP (RADIOLOG
120.0000 mg | Freq: Once | INTRAMUSCULAR | Status: AC
  Administered 2020-04-09: 120 mg via EPIDURAL

## 2020-04-09 MED ORDER — IOPAMIDOL (ISOVUE-M 200) INJECTION 41%
1.0000 mL | Freq: Once | INTRAMUSCULAR | Status: AC
  Administered 2020-04-09: 1 mL via EPIDURAL

## 2020-04-09 NOTE — Discharge Instructions (Signed)

## 2020-04-17 DIAGNOSIS — R32 Unspecified urinary incontinence: Secondary | ICD-10-CM | POA: Diagnosis not present

## 2020-04-30 ENCOUNTER — Other Ambulatory Visit: Payer: Self-pay

## 2020-04-30 ENCOUNTER — Ambulatory Visit (INDEPENDENT_AMBULATORY_CARE_PROVIDER_SITE_OTHER): Payer: Medicare HMO | Admitting: Physician Assistant

## 2020-04-30 ENCOUNTER — Encounter: Payer: Self-pay | Admitting: Physician Assistant

## 2020-04-30 VITALS — BP 130/70 | HR 74 | Temp 98.4°F | Resp 16 | Ht 66.0 in | Wt 233.0 lb

## 2020-04-30 DIAGNOSIS — M170 Bilateral primary osteoarthritis of knee: Secondary | ICD-10-CM

## 2020-04-30 LAB — BASIC METABOLIC PANEL
BUN: 13 mg/dL (ref 6–23)
CO2: 31 mEq/L (ref 19–32)
Calcium: 9.3 mg/dL (ref 8.4–10.5)
Chloride: 101 mEq/L (ref 96–112)
Creatinine, Ser: 0.99 mg/dL (ref 0.40–1.50)
GFR: 79.58 mL/min (ref >60.00)
Glucose, Bld: 96 mg/dL (ref 70–99)
Potassium: 4.5 mEq/L (ref 3.5–5.1)
Sodium: 138 mEq/L (ref 135–145)

## 2020-04-30 MED ORDER — CELECOXIB 100 MG PO CAPS: 100.0000 mg | ORAL_CAPSULE | Freq: Two times a day (BID) | ORAL | 2 refills | Status: DC

## 2020-04-30 NOTE — Progress Notes (Signed)
Patient presents to clinic today c/o ongoing bilateral knee pain.  Is currently followed by orthopedics for end-stage osteoarthritis of knees. Recommended bilateral TKR.  Has recently finished a series of injections.  Some relief with injections but are not lasting the 6 months between series. Pain level averaging -- 5/10 up to 8-9/10. Is keeping active from 4 am until the evening. Does not like to sit.  Has been trying Voltaren gel, blue emu, turmeric and glucosamine without improvement.  Is also previously done acupuncture and physical therapy.  Has history of alcohol abuse and as such wants to avoid any type of habit-forming medication.  Past Medical History:  Diagnosis Date  . Arthritis   . Basal cell carcinoma 04/29/2018   upper mid back, right mid back   . Basal cell carcinoma 04/29/2018   right mid back   . Basal cell carcinoma 06/10/2018   left lover back   . Former heavy cigarette smoker (20-39 per day) 03/05/2018   Quit 2000; 60 pak year history  . HTN (hypertension)     Current Outpatient Medications on File Prior to Visit  Medication Sig Dispense Refill  . amLODipine (NORVASC) 5 MG tablet Take 1 tablet (5 mg total) by mouth daily. 90 tablet 1  . FLUoxetine (PROZAC) 20 MG capsule TAKE ONE CAPSULE BY MOUTH DAILY 30 capsule 2   No current facility-administered medications on file prior to visit.    Allergies  Allergen Reactions  . Other Itching and Other (See Comments)    Opioids - "scratch my face off"     Family History  Problem Relation Age of Onset  . Dementia Mother        alcohol  . Alcohol abuse Mother   . Heart attack Father   . Heart disease Father   . COPD Father   . CAD Brother   . Healthy Daughter   . Healthy Son   . Healthy Son     Social History   Socioeconomic History  . Marital status: Married    Spouse name: Not on file  . Number of children: 3  . Years of education: Not on file  . Highest education level: Not on file  Occupational  History  . Occupation: disabled  Tobacco Use  . Smoking status: Former Smoker    Packs/day: 2.00    Years: 30.00    Pack years: 60.00    Types: Cigarettes    Quit date: 06/16/1998    Years since quitting: 21.8  . Smokeless tobacco: Never Used  Vaping Use  . Vaping Use: Never used  Substance and Sexual Activity  . Alcohol use: No    Comment: sober 08-28-2017  . Drug use: No  . Sexual activity: Yes    Partners: Female  Other Topics Concern  . Not on file  Social History Narrative  . Not on file   Social Determinants of Health   Financial Resource Strain: Low Risk   . Difficulty of Paying Living Expenses: Not hard at all  Food Insecurity: No Food Insecurity  . Worried About Charity fundraiser in the Last Year: Never true  . Ran Out of Food in the Last Year: Never true  Transportation Needs: No Transportation Needs  . Lack of Transportation (Medical): No  . Lack of Transportation (Non-Medical): No  Physical Activity: Sufficiently Active  . Days of Exercise per Week: 5 days  . Minutes of Exercise per Session: 60 min  Stress: No Stress Concern Present  .  Feeling of Stress : Not at all  Social Connections:   . Frequency of Communication with Friends and Family: Not on file  . Frequency of Social Gatherings with Friends and Family: Not on file  . Attends Religious Services: Not on file  . Active Member of Clubs or Organizations: Not on file  . Attends Archivist Meetings: Not on file  . Marital Status: Not on file   Review of Systems - See HPI.  All other ROS are negative.  BP 130/70   Pulse 74   Temp 98.4 F (36.9 C) (Temporal)   Resp 16   Ht 5\' 6"  (1.676 m)   Wt 233 lb (105.7 kg)   SpO2 98%   BMI 37.61 kg/m   Physical Exam Vitals reviewed.  Constitutional:      Appearance: Normal appearance.  HENT:     Head: Normocephalic and atraumatic.  Cardiovascular:     Rate and Rhythm: Normal rate and regular rhythm.     Pulses: Normal pulses.     Heart  sounds: Normal heart sounds.  Pulmonary:     Effort: Pulmonary effort is normal.     Breath sounds: Normal breath sounds.  Musculoskeletal:     Cervical back: Neck supple.  Neurological:     General: No focal deficit present.     Mental Status: He is alert.  Psychiatric:        Mood and Affect: Mood normal.    Assessment/Plan: 1. Primary osteoarthritis of both knees End-stage.  Patient trying to avoid surgery if at all possible.  Cannot have another series of injections for 6 months.  Will check renal function today to ensure stable.  We will plan to start Celebrex 100 mg twice daily.  Follow-up in 2 to 3 weeks so further adjustments in dose can be made. - Basic metabolic panel - celecoxib (CELEBREX) 100 MG capsule; Take 1 capsule (100 mg total) by mouth 2 (two) times daily.  Dispense: 60 capsule; Refill: 2  This visit occurred during the SARS-CoV-2 public health emergency.  Safety protocols were in place, including screening questions prior to the visit, additional usage of staff PPE, and extensive cleaning of exam room while observing appropriate contact time as indicated for disinfecting solutions.     Leeanne Rio, PA-C

## 2020-04-30 NOTE — Patient Instructions (Signed)
Please go to the lab today for blood work.  I will call you with your results. We will alter treatment regimen(s) if indicated by your results.   Please take the Celebrex twice daily as directed with food. Tylenol for breakthrough pain. Follow-up with me via phone visit in 2-3 weeks to reassess and make further adjustments. Follow-up with Orthopedics as scheduled.

## 2020-05-07 ENCOUNTER — Telehealth: Payer: Self-pay | Admitting: Family Medicine

## 2020-05-07 DIAGNOSIS — M545 Low back pain, unspecified: Secondary | ICD-10-CM

## 2020-05-07 DIAGNOSIS — G8929 Other chronic pain: Secondary | ICD-10-CM

## 2020-05-07 DIAGNOSIS — M48061 Spinal stenosis, lumbar region without neurogenic claudication: Secondary | ICD-10-CM

## 2020-05-07 MED ORDER — PREDNISONE 50 MG PO TABS: 50.0000 mg | ORAL_TABLET | Freq: Every day | ORAL | 0 refills | Status: DC

## 2020-05-07 NOTE — Telephone Encounter (Signed)
Next step is facet joint injections.  Based on my note from last month you had more low back pain than leg pain and neck step really should be facet injections.  I have already ordered these.  Please call [facility] at [phone] to schedule.  I have also sent in a prescription of prednisone to cover you over the weekend.  Please let me know how you are feeling if not improved.

## 2020-05-07 NOTE — Telephone Encounter (Signed)
Patient called stating that he is still having continued back pain that radiates down his legs. He said that the last epidural injection did not seem to help. He would like to move on to the next steps.   He also asked if prednisone could be sent in for him to help over the holidays?  Please advise.

## 2020-05-07 NOTE — Telephone Encounter (Signed)
Patient called back. Given MD response. He appreciates the quick response.

## 2020-05-07 NOTE — Telephone Encounter (Signed)
Called pt and left detailed message on pt's VM w/ advice regarding facet injections and fact that prednisone rx has been sent in.

## 2020-05-14 ENCOUNTER — Other Ambulatory Visit: Payer: Self-pay | Admitting: Physician Assistant

## 2020-05-14 ENCOUNTER — Ambulatory Visit
Admission: RE | Admit: 2020-05-14 | Discharge: 2020-05-14 | Disposition: A | Discharge status: Home / Self Care | Payer: Medicare HMO | Source: Ambulatory Visit | Attending: Family Medicine | Admitting: Family Medicine

## 2020-05-14 ENCOUNTER — Other Ambulatory Visit: Payer: Self-pay

## 2020-05-14 DIAGNOSIS — G8929 Other chronic pain: Secondary | ICD-10-CM

## 2020-05-14 DIAGNOSIS — M47817 Spondylosis without myelopathy or radiculopathy, lumbosacral region: Secondary | ICD-10-CM | POA: Diagnosis not present

## 2020-05-14 DIAGNOSIS — M48061 Spinal stenosis, lumbar region without neurogenic claudication: Secondary | ICD-10-CM

## 2020-05-14 DIAGNOSIS — M545 Low back pain, unspecified: Secondary | ICD-10-CM

## 2020-05-14 DIAGNOSIS — I10 Essential (primary) hypertension: Secondary | ICD-10-CM

## 2020-05-14 MED ORDER — IOPAMIDOL (ISOVUE-M 200) INJECTION 41%
1.0000 mL | Freq: Once | INTRAMUSCULAR | Status: AC
  Administered 2020-05-14: 1 mL via INTRA_ARTICULAR

## 2020-05-14 MED ORDER — METHYLPREDNISOLONE ACETATE 40 MG/ML INJ SUSP (RADIOLOG
120.0000 mg | Freq: Once | INTRAMUSCULAR | Status: AC
  Administered 2020-05-14: 120 mg via INTRA_ARTICULAR

## 2020-05-14 NOTE — Discharge Instructions (Signed)

## 2020-05-16 ENCOUNTER — Other Ambulatory Visit: Payer: Medicare HMO

## 2020-05-16 DIAGNOSIS — R32 Unspecified urinary incontinence: Secondary | ICD-10-CM | POA: Diagnosis not present

## 2020-06-17 DIAGNOSIS — R32 Unspecified urinary incontinence: Secondary | ICD-10-CM | POA: Diagnosis not present

## 2020-06-20 ENCOUNTER — Other Ambulatory Visit: Payer: Medicare HMO

## 2020-06-20 DIAGNOSIS — Z20822 Contact with and (suspected) exposure to covid-19: Secondary | ICD-10-CM

## 2020-06-23 LAB — NOVEL CORONAVIRUS, NAA: SARS-CoV-2, NAA: DETECTED — AB

## 2020-06-30 ENCOUNTER — Other Ambulatory Visit: Payer: Self-pay | Admitting: Physician Assistant

## 2020-06-30 DIAGNOSIS — F411 Generalized anxiety disorder: Secondary | ICD-10-CM

## 2020-07-13 ENCOUNTER — Encounter: Payer: Self-pay | Admitting: Emergency Medicine

## 2020-07-18 ENCOUNTER — Other Ambulatory Visit: Payer: Self-pay | Admitting: Orthopaedic Surgery

## 2020-07-18 ENCOUNTER — Telehealth: Payer: Self-pay

## 2020-07-18 DIAGNOSIS — R32 Unspecified urinary incontinence: Secondary | ICD-10-CM | POA: Diagnosis not present

## 2020-07-18 MED ORDER — PREDNISONE 50 MG PO TABS: 50.0000 mg | ORAL_TABLET | Freq: Every day | ORAL | 0 refills | Status: DC

## 2020-07-18 NOTE — Telephone Encounter (Signed)
I did send them the steroid.  It is okay to work on approval for hyaluronic acid injections for the knee.

## 2020-07-18 NOTE — Telephone Encounter (Signed)
Patient called he is due for a injection in march he would like to get pre approval process started , he would like a refill for prednisone until he has received the injection CB:[phone]

## 2020-07-19 NOTE — Telephone Encounter (Signed)
Called and discussed with pt. Last inj was in October so pt understands we wont be able to do another round until April. I also informed him that the prednisone was called in. April can you please submit for this in April? Thank you

## 2020-07-19 NOTE — Telephone Encounter (Signed)
Noted! Thank you

## 2020-07-31 ENCOUNTER — Other Ambulatory Visit: Payer: Self-pay | Admitting: Physician Assistant

## 2020-07-31 DIAGNOSIS — M170 Bilateral primary osteoarthritis of knee: Secondary | ICD-10-CM

## 2020-08-13 ENCOUNTER — Telehealth: Payer: Self-pay | Admitting: Orthopaedic Surgery

## 2020-08-13 ENCOUNTER — Telehealth: Payer: Self-pay

## 2020-08-13 NOTE — Telephone Encounter (Signed)
Please advise.  Pt has had 3 epidurals since June 2021.

## 2020-08-13 NOTE — Telephone Encounter (Signed)
States that lower back is feeling bad bad again and his last injection was in November and is wondering what the next steps are. He would like a call back to discuss those options or if he needs to just schedule an appointment.   Left knee needing replacement cannot walk for three days and is going to call Dr. Trevor Mace office to inquire about knee surgery.

## 2020-08-13 NOTE — Telephone Encounter (Signed)
Would you like to call him or me make him an appt

## 2020-08-13 NOTE — Telephone Encounter (Signed)
Pt called stating Dr.Blackman told him to reach out when he thinks he's ready for a knee replacement but he still has some questions and concerns before he gets it scheduled. Pt states his knee is giving him a fit so he would like a CB as soon as possible.   [phone]

## 2020-08-13 NOTE — Telephone Encounter (Signed)
He needs an appointment so we can go over things with him.

## 2020-08-13 NOTE — Telephone Encounter (Signed)
Samuel [name] wants to see him in office to discuss instead of phone, can you make an appointment for him please

## 2020-08-14 DIAGNOSIS — R32 Unspecified urinary incontinence: Secondary | ICD-10-CM | POA: Diagnosis not present

## 2020-08-14 NOTE — Telephone Encounter (Signed)
Your back is somewhat complicated and you have had different kinds of back injections to treat different things.  We done facet injections which should help treat back pain itself and you have had epidural steroid injections which should help to treat more pain going down your legs.  Can you update me is it more that your back hurts or is it that your legs are hurting?  We may want to have a face-to-face visit soon to go over everything and discuss next steps.

## 2020-08-14 NOTE — Telephone Encounter (Signed)
Left message for patient to call back to discuss.

## 2020-08-14 NOTE — Telephone Encounter (Signed)
Please call pt to schedule a follow-up appt to discuss his back and possible injections.

## 2020-08-15 ENCOUNTER — Ambulatory Visit (INDEPENDENT_AMBULATORY_CARE_PROVIDER_SITE_OTHER): Payer: Medicare HMO

## 2020-08-15 ENCOUNTER — Ambulatory Visit (INDEPENDENT_AMBULATORY_CARE_PROVIDER_SITE_OTHER): Payer: Medicare HMO | Admitting: Orthopaedic Surgery

## 2020-08-15 ENCOUNTER — Ambulatory Visit (INDEPENDENT_AMBULATORY_CARE_PROVIDER_SITE_OTHER): Payer: Medicare HMO | Admitting: Family Medicine

## 2020-08-15 ENCOUNTER — Other Ambulatory Visit: Payer: Self-pay

## 2020-08-15 ENCOUNTER — Encounter: Payer: Self-pay | Admitting: Orthopaedic Surgery

## 2020-08-15 VITALS — Ht 66.0 in | Wt 233.0 lb

## 2020-08-15 VITALS — BP 139/72 | HR 68 | Ht 66.0 in | Wt 238.2 lb

## 2020-08-15 DIAGNOSIS — M25562 Pain in left knee: Secondary | ICD-10-CM | POA: Diagnosis not present

## 2020-08-15 DIAGNOSIS — G8929 Other chronic pain: Secondary | ICD-10-CM | POA: Diagnosis not present

## 2020-08-15 DIAGNOSIS — M25551 Pain in right hip: Secondary | ICD-10-CM

## 2020-08-15 DIAGNOSIS — M1712 Unilateral primary osteoarthritis, left knee: Secondary | ICD-10-CM

## 2020-08-15 MED ORDER — METHYLPREDNISOLONE 4 MG PO TABS: ORAL_TABLET | ORAL | 0 refills | Status: DC

## 2020-08-15 NOTE — Patient Instructions (Signed)
Thank you for coming in today.  Please get an Xray today before you leave  The pain is likely coming from either the hip or the low back.  Get xray today.  Consider a diagnostic injection if the pain bother you enough.  We will want to do the injection 1 month before the knee replacement.

## 2020-08-15 NOTE — Progress Notes (Signed)
I, Wendy Poet, LAT, ATC, am serving as scribe for Dr. Lynne Leader.  Samuel [name] is a 65 y.o. male who presents to [place] at [facility] today for f/u of chronic LBP w/ radiating/radicular pain into his R LE.  He was last seen by Dr. Georgina Snell on 03/30/20 and noted recurrence of his LBP w/ radiating pain into his R LE.  He was referred for a lumbar epidural and had a L L3-4 epidural injection on 04/09/20 and repeat ESI on 05/14/20 (B L4-5). Since his last visit, pt reports he is going to proceed w/ L knee replacement. Pt reports a week ago, pain moved to anterior R hip and distally into R thigh. Pt describes pain as feeling like a "rubber band" " nerve" feeling.  Diagnostic imaging: L-spine MRI- 11/19/19; L-spine XR- 05/02/19  Pertinent review of systems: No fevers or chills  Relevant historical information: Hypertension   Exam:  BP 139/72 (BP Location: Right Arm, Patient Position: Sitting, Cuff Size: Normal)   Pulse 68   Ht 5\' 6"  (1.676 m)   Wt 238 lb 3.2 oz (108 kg)   SpO2 99%   BMI 38.45 kg/m  General: Well Developed, well nourished, and in no acute distress.   MSK: L-spine normal-appearing nontender midline.  Normal lumbar motion.  Negative right-sided slump test. Extremity strength reflexes and sensation are equal and normal throughout.  Right hip normal-appearing Range of motion slightly limited in flexion and internal rotation with some pain and reproduced in the groin. Strength is intact.    Lab and Radiology Results  X-ray images right hip obtained today personally and independently interpreted Mild degenerative changes right hip no acute fractures. Await formal radiology review    TECHNIQUE: Multiplanar, multisequence MR imaging of the lumbar spine was performed. No intravenous contrast was administered.  COMPARISON:  Plain films May 02, 2019  FINDINGS: Segmentation:  Standard.  Alignment:  Physiologic.  Vertebrae: No  fracture, evidence of discitis, or bone lesion. Mild diffuse decrease of the T1 signal throughout the visualized spine, nonspecific, may represent red marrow reconversion.  Conus medullaris and cauda equina: Conus extends to the L1 level. Conus and cauda equina appear normal.  Paraspinal and other soft tissues: Negative.  Disc levels:  T12-L1: No spinal canal or neural foraminal stenosis.  L1-2: No spinal canal or neural foraminal stenosis.  L2-3: Mild loss of disc height, mild facet degenerative changes with bilateral joint effusion and ligamentum flavum redundancy, resulting in mild spinal canal stenosis and mild bilateral neural foraminal narrowing.  L3-4: Disc bulge with superimposed small left foraminal central disc protrusion abutting the exiting left L3 nerve root. There also facet degenerative change ligamentum flavum redundancy contributing for mild to moderate spinal canal stenosis, narrowing of the subarticular zones and mild bilateral neural foraminal narrowing.  L4-5: Disc bulge, prominent facet degenerative changes ligamentum flavum redundancy resulting in severe spinal canal stenosis and mild bilateral neural foraminal narrowing.  L5-S1: Mild facet degenerative changes. No spinal canal or neural foraminal stenosis.  IMPRESSION: 1. Lumbar spondylosis most pronounced at L4-5 where there is severe spinal canal stenosis and mild bilateral neural foraminal narrowing. 2. At L3-4 there is a small left foraminal central disc protrusion abutting the exiting left L3 nerve root. There is also mild to moderate spinal canal stenosis, narrowing of the subarticular zones and mild bilateral neural foraminal narrowing at this level. 3. Mild spinal canal stenosis and mild bilateral neural foraminal narrowing at L2-3. 4. Mild diffuse decrease of  the T1 signal throughout the visualized spine, nonspecific, may represent red marrow reconversion.   Electronically  Signed   By: Pedro Earls M.D.   On: 11/21/2019 09:08  I, Lynne Leader, personally (independently) visualized and performed the interpretation of the images attached in this note.   Assessment and Plan: 65 y.o. male with right thigh pain.  Either due to referred pain from hip impingement or hip OA or potential right lumbar radiculopathy at L2 or L3.  Patient does have an MRI of his lumbar spine from June 2021 that does show some mild bilateral neuroforaminal narrowing at L2-L3 which could be the cause of his right thigh pain now but it was not impressive last year at least.  Discussed options.  Neck step if not improved enough would be trial of diagnostic and therapeutic right hip injection followed by potential repeat lumbar MRI.  Of note he is set up for a left total knee replacement in mid April.  Would like to not expose patient to steroids after mid March if possible.   PDMP not reviewed this encounter. Orders Placed This Encounter  Procedures  . DG HIP UNILAT WITH PELVIS 2-3 VIEWS RIGHT    Standing Status:   Future    Number of Occurrences:   1    Standing Expiration Date:   08/15/2021    Order Specific Question:   Reason for Exam (SYMPTOM  OR DIAGNOSIS REQUIRED)    Answer:   eval right hip    Order Specific Question:   Preferred imaging location?    Answer:   [facility]   No orders of the defined types were placed in this encounter.    Discussed warning signs or symptoms. Please see discharge instructions. Patient expresses understanding.   The above documentation has been reviewed and is accurate and complete Lynne Leader, M.D.

## 2020-08-15 NOTE — Progress Notes (Signed)
The patient is well-known to me.  He has severe well-documented arthritis in his left knee.  He has tried and failed conservative treatment for several years now.  I have seen him for his knee for a while now.  We have tried steroid injections as well as activity modification and anti-inflammatories.  He has had hyaluronic acid for that knee as well.  He has had no acute change in medical status but his left knee hurts daily.  At this point is definitely affecting his mobility, his quality of life and his actives daily living.  He is ready proceed with knee replacement surgery.  On examination of his left knee there is varus malalignment of that knee.  There is a mild to moderate effusion.  He has significant patellofemoral crepitation throughout the flexion extension arc.  The knee is ligamentously stable.  Previous x-rays of the left knee does show tricompartment arthritis with varus malalignment, significant medial and patellofemoral joint space narrowing and particular osteophytes.  I had a long thorough discussion about knee replacement surgery with him.  I showed him a knee model and explained in detail what the surgery involves.  We talked about the risks and benefits of surgery.  I described what goes on during the interoperative and postoperative course.  He did ask appropriately about robotic surgery as well.  I gave him the name of a colleague and was in [place] who does MAKOplasty.  He would still like to have me schedule surgery for him in April for a total knee arthroplasty if possible.  All question concerns were answered and addressed.  We will be in touch with him about scheduling him for a left total knee arthroplasty that I feel is appropriate at this point given the failed conservative treatment and given his well-documented arthritis in that knee.

## 2020-08-16 NOTE — Progress Notes (Signed)
X-ray right hip looks largely normal to radiology

## 2020-08-17 ENCOUNTER — Telehealth: Payer: Self-pay | Admitting: Orthopaedic Surgery

## 2020-08-17 NOTE — Telephone Encounter (Signed)
Holding for Dr Ninfa Linden and Caryl Pina

## 2020-08-17 NOTE — Telephone Encounter (Signed)
Patient called requesting a call back. Patient is wanting to move forward with knee replacement surgery. Patient is willing to have it as soon as possible. Patient phone number is [phone].

## 2020-08-20 NOTE — Telephone Encounter (Signed)
I will fill out a surgery sheet for a left total knee.

## 2020-08-23 NOTE — Telephone Encounter (Signed)
I called patient to advise I would call him back to schedule surgery once I work through scheduling surgery orders written before his.  He wants to know how often he can take the prednisone?

## 2020-08-24 NOTE — Telephone Encounter (Signed)
Patient aware of the below message  

## 2020-08-24 NOTE — Telephone Encounter (Signed)
I wouldn't recommend prednisone prior to surgery unless surgery was three months away, which it should be in the next 4-6 weeks.

## 2020-09-03 ENCOUNTER — Telehealth: Payer: Self-pay

## 2020-09-03 NOTE — Telephone Encounter (Signed)
Submitted VOB for Orthovisc, left knee.  Pending BV.

## 2020-09-05 ENCOUNTER — Telehealth: Payer: Self-pay | Admitting: Orthopaedic Surgery

## 2020-09-05 NOTE — Telephone Encounter (Signed)
Patient asking when he is being scheduled

## 2020-09-05 NOTE — Telephone Encounter (Signed)
Spoke with patient and scheduled surgery. °

## 2020-09-05 NOTE — Telephone Encounter (Signed)
Patient is asking for Samuel [name]. To call back about medical question. Please call patient at [phone].

## 2020-09-10 ENCOUNTER — Other Ambulatory Visit: Payer: Self-pay | Admitting: Orthopaedic Surgery

## 2020-09-10 ENCOUNTER — Telehealth: Payer: Self-pay | Admitting: Orthopaedic Surgery

## 2020-09-10 MED ORDER — METHYLPREDNISOLONE 4 MG PO TABS: ORAL_TABLET | ORAL | 0 refills | Status: DC

## 2020-09-10 NOTE — Telephone Encounter (Signed)
Patient called. He would like a refill on prednisone. His call back number is [phone]

## 2020-09-11 ENCOUNTER — Telehealth: Payer: Self-pay | Admitting: Physician Assistant

## 2020-09-11 NOTE — Chronic Care Management (AMB) (Signed)
  Chronic Care Management   Note  09/11/2020 Name: KARSIN PESTA MRN: [id number] DOB: 01-23-1956  Elwyn Lade Yan is a 65 y.o. year old male who is a primary care patient of Delorse Limber. I reached out to Lynden Ang by phone today in response to a referral sent by Mr. Kavion Mancinas [place]'s PCP, Brunetta Jeans, PA-C.   Mr. Crisanto was given information about Chronic Care Management services today including:  1. CCM service includes personalized support from designated clinical staff supervised by his physician, including individualized plan of care and coordination with other care providers 2. 24/7 contact phone numbers for assistance for urgent and routine care needs. 3. Service will only be billed when office clinical staff spend 20 minutes or more in a month to coordinate care. 4. Only one practitioner may furnish and bill the service in a calendar month. 5. The patient may stop CCM services at any time (effective at the end of the month) by phone call to the office staff.   Patient agreed to services and verbal consent obtained.   Follow up plan:   Lauretta Grill [facility] Scheduler

## 2020-09-11 NOTE — Chronic Care Management (AMB) (Signed)
  Chronic Care Management   Outreach Note  09/11/2020 Name: Samuel [name] MRN: [id number] DOB: 1956/05/17  Referred by: Brunetta Jeans, PA-C Reason for referral : No chief complaint on file.   An unsuccessful telephone outreach was attempted today. The patient was referred to the pharmacist for assistance with care management and care coordination.   Follow Up Plan:   Lauretta Grill Upstream Scheduler

## 2020-09-12 ENCOUNTER — Telehealth: Payer: Self-pay

## 2020-09-12 NOTE — Telephone Encounter (Signed)
PA required for Orthovisc, left knee.  Submitted PA online through [place] Pending# [id number]

## 2020-09-18 ENCOUNTER — Telehealth: Payer: Self-pay

## 2020-09-18 NOTE — Telephone Encounter (Signed)
Called and left a VM for patient to CB to schedule an appointment with Dr. Ninfa Linden for gel injection.  Approved for Monovisc, left knee. [place] Patient is covered at 100% through his insurance No Co-pay PA Approval# [id number] Valid 09/25/2020- 03/15/2021

## 2020-09-24 ENCOUNTER — Other Ambulatory Visit: Payer: Self-pay

## 2020-09-24 ENCOUNTER — Ambulatory Visit (INDEPENDENT_AMBULATORY_CARE_PROVIDER_SITE_OTHER): Payer: Medicare HMO | Admitting: Family Medicine

## 2020-09-24 ENCOUNTER — Encounter: Payer: Self-pay | Admitting: Family Medicine

## 2020-09-24 ENCOUNTER — Telehealth: Payer: Self-pay

## 2020-09-24 VITALS — BP 161/75 | HR 76 | Ht 66.0 in | Wt 247.0 lb

## 2020-09-24 DIAGNOSIS — I10 Essential (primary) hypertension: Secondary | ICD-10-CM | POA: Diagnosis not present

## 2020-09-24 DIAGNOSIS — E038 Other specified hypothyroidism: Secondary | ICD-10-CM

## 2020-09-24 DIAGNOSIS — E669 Obesity, unspecified: Secondary | ICD-10-CM | POA: Diagnosis not present

## 2020-09-24 DIAGNOSIS — Z01818 Encounter for other preprocedural examination: Secondary | ICD-10-CM | POA: Diagnosis not present

## 2020-09-24 DIAGNOSIS — Z7689 Persons encountering health services in other specified circumstances: Secondary | ICD-10-CM

## 2020-09-24 DIAGNOSIS — M1712 Unilateral primary osteoarthritis, left knee: Secondary | ICD-10-CM

## 2020-09-24 DIAGNOSIS — Z1321 Encounter for screening for nutritional disorder: Secondary | ICD-10-CM | POA: Diagnosis not present

## 2020-09-24 DIAGNOSIS — I2510 Atherosclerotic heart disease of native coronary artery without angina pectoris: Secondary | ICD-10-CM

## 2020-09-24 NOTE — Chronic Care Management (AMB) (Signed)
    Chronic Care Management Pharmacy Assistant   Name: Samuel [name]  MRN: [id number] DOB: 1956/05/10  Samuel [name] is an 65 y.o. year old male who presents for his initial CCM visit with the clinical pharmacist.  Reason for Encounter: Chart Review for Initial CCM Visit   Conditions to be addressed/monitored: HTN, Hypothyroidism, Rosacea, Osteoarthritis   Recent office visits:  n/a  Recent consult visits:  09/24/2020 OV (orthopedics) Eunice Blase, MD; no medication changes indicated.  08/15/2020 OV (sports medicine) Lynne Leader, MD; no medication changes indicated.  Hospital visits:  None in previous 6 months  Medications: Outpatient Encounter Medications as of 09/24/2020  Medication Sig Note  . amLODipine (NORVASC) 5 MG tablet TAKE 1 TABLET EVERY DAY   . FLUoxetine (PROZAC) 20 MG capsule TAKE ONE CAPSULE BY MOUTH DAILY   . ibuprofen (ADVIL) 200 MG tablet Take 200 mg by mouth 2 (two) times daily. 09/24/2020: 2 bid   No facility-administered encounter medications on file as of 09/24/2020.    Medication Fill History: Amlodipine 5 mg last filled 05/15/2020 90 DS Fluoxetine 20 mg last filled 08/26/2020 30 DS Ibuprofen 200 mg  Celebrex last filled 08/03/2020 90 DS  Any changes in your medications or health?  Any side effects from any medications?   Do you have any symptoms or problems not managed by your medications?  Any concerns about your health right now?  Has your provider asked that you check blood pressure, blood sugar, or follow special diet at home?  Do you get any type of exercise on a regular basis?  Can you think of a goal you would like to reach for your health?  Do you have any problems getting your medications?  Is there anything that you would like to discuss during the appointment?   Please bring medications and supplements to appointment  Future Appointments  Date Time Provider [place]  09/27/2020 11:00 AM [facility] CCM PHARMACIST  [facility]  10/03/2020  9:50 AM Maximiano Coss, NP [facility]  10/23/2020  9:30 AM Mcarthur Rossetti, MD [facility] None    **Patient states he decided to transfer his care to a different PCP instead of transferring his care to Maximiano Coss, NP. Patient decided to cancel this appointment at the time of his call.**  April Daiva Huge, [place] Pharmacist Assistant [phone]

## 2020-09-24 NOTE — Progress Notes (Signed)
Office Visit Note   Patient: Samuel [name]           Date of Birth: [birth year]           MRN: [id number] Visit Date: 09/24/2020 Requested by: Brunetta Jeans, PA-C [place],  [place] [address] PCP: Delorse Limber  Subjective: Chief Complaint  Patient presents with  . Other    Establish primary care - needs preop exam prior to knee replacement later this month.    HPI: He is here to establish care.  The primary reason for his visit is preoperative evaluation for upcoming left knee replacement.  Longstanding problems with his knee, for at least 15 years.  He has failed conservative management and is ready for replacement.  He has never had a major surgery requiring general anesthesia.  He is hoping that this 1 can be done without intubation.  He has hypertension which has generally been well controlled with amlodipine 5 mg daily.  He has a history of alcoholism and was able to quit successfully 3 years ago by going through rehab.  He has a history of anxiety which has been well controlled with Prozac.  He has a history of elevated TSH not requiring treatment.  He has a history of mild nonobstructive coronary artery disease after having catheterization 3 years ago due to an abnormal stress test.  No stent was required.  He is asymptomatic from a cardiac standpoint.  He is a former heavy smoker who was able to quit.  He had a Cologuard test which was negative.                ROS:   All other systems were reviewed and are negative.  Objective: Vital Signs: BP (!) 161/75   Pulse 76   Ht 5\' 6"  (1.676 m)   Wt 247 lb (112 kg)   BMI 39.87 kg/m   Physical Exam:  General:  Alert and oriented, in no acute distress. Pulm:  Breathing unlabored. Psy:  Normal mood, congruent affect. Skin: No suspicious lesions HEENT:  [place]/AT, PERRLA, EOM Full, no nystagmus.  Funduscopic examination within normal limits.  No conjunctival erythema.  Tympanic  membranes are pearly gray with normal landmarks.  External ear canals are normal.  Nasal passages are clear.  Oropharynx is clear.  No significant lymphadenopathy.  No thyromegaly or nodules.  2+ carotid pulses without bruits. CV: Regular rate and rhythm without murmurs, rubs, or gallops.  No peripheral edema.  2+ radial and posterior tibial pulses. Lungs: Clear to auscultation throughout with no wheezing or areas of consolidation. Abd: Bowel sounds are active, no hepatosplenomegaly or masses.  Soft and nontender.  No audible bruits.  No evidence of ascites.    Imaging: No results found.  Assessment & Plan: 1.  Preoperative examination, unremarkable -We will do some labs to evaluate. Lyndel Safe perioperative risk is 0.2%.  He is cleared to proceed.  2.  End-stage left knee DJD -Proceed with replacement as directed.  3.  Hypertension, suboptimally controlled. -Continue with amlodipine.  Monitor blood pressure periodically.  4.  Anxiety, controlled with Prozac.  5.  Subclinical hypothyroidism -Recheck levels today.  6.  Recovering alcoholic  7.  Former smoker     Procedures: No procedures performed        PMFS History: Patient Active Problem List   Diagnosis Date Noted  . Unilateral primary osteoarthritis, left knee 06/22/2018  . Unilateral primary osteoarthritis, right knee 06/22/2018  .  Screening for colorectal cancer 05/17/2018  . Nonobstructive atherosclerosis of coronary artery 03/05/2018  . Obesity (BMI 30-39.9) 03/05/2018  . Former heavy cigarette smoker (20-39 per day) 03/05/2018  . Varicose veins of bilateral lower extremities with pain 12/28/2017  . Umbilical hernia 77/82/4235  . Alcohol abuse, in remission 09/17/2017  . Osteoarthritis of knee 08/10/2017  . Rosacea 06/11/2017  . Subclinical hypothyroidism 06/05/2017  . Essential hypertension 05/27/2017  . Bilateral hearing loss 05/04/2014   Past Medical History:  Diagnosis Date  . Arthritis   . Basal  cell carcinoma 04/29/2018   upper mid back, right mid back   . Basal cell carcinoma 04/29/2018   right mid back   . Basal cell carcinoma 06/10/2018   left lover back   . Former heavy cigarette smoker (20-39 per day) 03/05/2018   Quit 2000; 60 pak year history  . HTN (hypertension)     Family History  Problem Relation Age of Onset  . Dementia Mother        alcohol  . Alcohol abuse Mother   . Heart attack Father 34  . Heart disease Father 21  . COPD Father   . Stroke Father   . Healthy Daughter   . Healthy Son   . Healthy Son   . CAD Brother   . Heart attack Brother     Past Surgical History:  Procedure Laterality Date  . LEFT HEART CATH AND CORONARY ANGIOGRAPHY N/A 06/15/2017   Procedure: LEFT HEART CATH AND CORONARY ANGIOGRAPHY;  Surgeon: Nelva Bush, MD;  Location: [facility];  Service: Cardiovascular;  Laterality: N/A;  . TONSILLECTOMY    . UMBILICAL HERNIA REPAIR N/A 11/03/2017   Procedure: UMBILICAL HERNIA REPAIR;  Surgeon: Clovis Riley, MD;  Location: [facility];  Service: General;  Laterality: N/A;   Social History   Occupational History  . Occupation: disabled  Tobacco Use  . Smoking status: Former Smoker    Packs/day: 2.00    Years: 30.00    Pack years: 60.00    Types: Cigarettes    Quit date: 06/16/1998    Years since quitting: 22.2  . Smokeless tobacco: Never Used  Vaping Use  . Vaping Use: Never used  Substance and Sexual Activity  . Alcohol use: No    Comment: sober 08-28-2017  . Drug use: No  . Sexual activity: Yes    Partners: Female

## 2020-09-25 ENCOUNTER — Telehealth: Payer: Self-pay | Admitting: Family Medicine

## 2020-09-25 LAB — CBC WITH DIFFERENTIAL/PLATELET
Absolute Monocytes: 1102 cells/uL — ABNORMAL HIGH (ref 200–950)
Basophils Absolute: 85 cells/uL (ref 0–200)
Basophils Relative: 0.8 %
Eosinophils Absolute: 371 cells/uL (ref 15–500)
Eosinophils Relative: 3.5 %
HCT: 45.1 % (ref 38.5–50.0)
Hemoglobin: 15.2 g/dL (ref 13.2–17.1)
Lymphs Abs: 3106 cells/uL (ref 850–3900)
MCH: 29.9 pg (ref 27.0–33.0)
MCHC: 33.7 g/dL (ref 32.0–36.0)
MCV: 88.6 fL (ref 80.0–100.0)
MPV: 10.2 fL (ref 7.5–12.5)
Monocytes Relative: 10.4 %
Neutro Abs: 5936 cells/uL (ref 1500–7800)
Neutrophils Relative %: 56 %
Platelets: 248 10*3/uL (ref 140–400)
RBC: 5.09 10*6/uL (ref 4.20–5.80)
RDW: 12.6 % (ref 11.0–15.0)
Total Lymphocyte: 29.3 %
WBC: 10.6 10*3/uL (ref 3.8–10.8)

## 2020-09-25 LAB — COMPREHENSIVE METABOLIC PANEL
AG Ratio: 1.8 (calc) (ref 1.0–2.5)
ALT: 18 U/L (ref 9–46)
AST: 19 U/L (ref 10–35)
Albumin: 4.1 g/dL (ref 3.6–5.1)
Alkaline phosphatase (APISO): 59 U/L (ref 35–144)
BUN: 15 mg/dL (ref 7–25)
CO2: 27 mmol/L (ref 20–32)
Calcium: 9.1 mg/dL (ref 8.6–10.3)
Chloride: 104 mmol/L (ref 98–110)
Creat: 0.96 mg/dL (ref 0.70–1.25)
Globulin: 2.3 g/dL (calc) (ref 1.9–3.7)
Glucose, Bld: 96 mg/dL (ref 65–99)
Potassium: 4.8 mmol/L (ref 3.5–5.3)
Sodium: 139 mmol/L (ref 135–146)
Total Bilirubin: 0.4 mg/dL (ref 0.2–1.2)
Total Protein: 6.4 g/dL (ref 6.1–8.1)

## 2020-09-25 LAB — THYROID PANEL WITH TSH
Free Thyroxine Index: 2 (ref 1.4–3.8)
T3 Uptake: 31 % (ref 22–35)
T4, Total: 6.5 ug/dL (ref 4.9–10.5)
TSH: 3.22 mIU/L (ref 0.40–4.50)

## 2020-09-25 LAB — VITAMIN D 25 HYDROXY (VIT D DEFICIENCY, FRACTURES): Vit D, 25-Hydroxy: 32 ng/mL (ref 30–100)

## 2020-09-25 NOTE — Telephone Encounter (Signed)
Labs are notable for the following:  Vitamin D is borderline at 32.  We want this to be 50-80.  I recommend taking vitamin D3 at 2000 IU daily.  CBC shows slight elevation of absolute monocytes but since everything else is normal, this is most likely insignificant.  All else looks good.

## 2020-09-26 ENCOUNTER — Other Ambulatory Visit: Payer: Self-pay | Admitting: Physician Assistant

## 2020-09-27 ENCOUNTER — Ambulatory Visit: Payer: Medicare HMO

## 2020-10-01 ENCOUNTER — Other Ambulatory Visit: Payer: Self-pay | Admitting: Family Medicine

## 2020-10-01 DIAGNOSIS — F411 Generalized anxiety disorder: Secondary | ICD-10-CM

## 2020-10-01 MED ORDER — FLUOXETINE HCL 20 MG PO CAPS: 20.0000 mg | ORAL_CAPSULE | Freq: Every day | ORAL | 1 refills | Status: DC

## 2020-10-03 ENCOUNTER — Encounter: Payer: Medicare HMO | Admitting: Registered Nurse

## 2020-10-04 ENCOUNTER — Telehealth: Payer: Self-pay | Admitting: Orthopaedic Surgery

## 2020-10-04 NOTE — Telephone Encounter (Signed)
Pt called stating he is set to have surgery on 10/09/20 and he wanted to make sure he was still having a nerve block done rather than being on anesthesia? Pt also stated he had a couple of questions about the recovery. Pt would like a CB to discuss further.   [phone]

## 2020-10-04 NOTE — Progress Notes (Signed)
Surgical Instructions    Your procedure is scheduled on Tuesday 10/09/2020.  Report to [facility] Main Entrance "A" at 10:00 A.M., then check in with the Admitting office.   Call this number if you have problems the morning of surgery:  [phone]    If you have any questions prior to your surgery date call [phone]: Open Monday-Friday 8am-4pm    Remember:  Do not eat after midnight the night before your surgery  You may drink clear liquids until 09:00am the morning of your surgery.   Clear liquids allowed are: Water, Non-Citrus Juices (without pulp), Carbonated Beverages, Clear Tea, Black Coffee Only, and Gatorade   Enhanced Recovery after Surgery for Orthopedics Enhanced Recovery after Surgery is a protocol used to improve the stress on your body and your recovery after surgery.  Patient Instructions  . The night before surgery:  o No food after midnight. ONLY clear liquids after midnight   . The day of surgery (if you do NOT have diabetes):  o Drink ONE (1) Pre-Surgery Clear Ensure by 09:00 am the morning of surgery   o This drink was given to you during your hospital  pre-op appointment visit. o Nothing else to drink after completing the  Pre-Surgery Clear Ensure.        If you have questions, please contact your surgeon's office.     Take these medicines the morning of surgery with A SIP OF WATER: Amlodipine (Norvasc) Fluoxetine (Prozac)  As of today, STOP taking any Aspirin or aspirin-containing products (unless otherwise instructed by your surgeon), Aleve, Naproxen, Ibuprofen, Motrin, Advil, Goody's, BC's, all herbal medications, supplements, fish oil, and all vitamins.          Do NOT Smoke (Tobacco/Vaping) or drink Alcohol 24 hours prior to your procedure  If you use a CPAP at night, you may bring all equipment for your overnight stay.   Contacts, glasses, hearing aids, dentures or partials may not be worn into surgery, please bring cases for these  belongings   For patients admitted to the hospital, discharge time will be determined by your treatment team.   Patients discharged the day of surgery will not be allowed to drive home, and someone needs to stay with them for 24 hours.    Special instructions:   [place]- Preparing For Surgery  Before surgery, you can play an important role. Because skin is not sterile, your skin needs to be as free of germs as possible. You can reduce the number of germs on your skin by washing with CHG (chlorahexidine gluconate) Soap before surgery.  CHG is an antiseptic cleaner which kills germs and bonds with the skin to continue killing germs even after washing.    Oral Hygiene is also important to reduce your risk of infection.  Remember - BRUSH YOUR TEETH THE MORNING OF SURGERY WITH YOUR REGULAR TOOTHPASTE  Please do not use if you have an allergy to CHG or antibacterial soaps. If your skin becomes reddened/irritated stop using the CHG.  Do not shave (including legs and underarms) for at least 48 hours prior to first CHG shower. It is OK to shave your face.  Please follow these instructions carefully.   You are going to shower with CHG soap 2 different times.  The NIGHT BEFORE SURGERY/PROCEDURE and then again the MORNING OF SURGERY/PROCEDURE   1. If you chose to wash your hair, wash your hair first as usual with your normal shampoo.  2. After you shampoo, rinse your hair  and body thoroughly to remove the shampoo.  3. Wash Face and genitals (private parts) with your normal soap.   4. THEN Shower with CHG Soap.   5. Use CHG as you would any other liquid soap. You can apply CHG directly to the skin and wash gently with a pouf/sponge or a clean washcloth.   6. Apply the CHG Soap to your body ONLY FROM THE NECK DOWN.  Do not use on open wounds or open sores. Avoid contact with your eyes, ears, mouth and genitals (private parts). Wash Face and genitals (private parts)  with your normal soap.    7. Wash thoroughly, paying special attention to the area where your surgery will be performed.  8. Thoroughly rinse your body with warm water from the neck down.  9. DO NOT shower/wash with your normal soap after using and rinsing off the CHG Soap.  10. Pat yourself dry with a CLEAN TOWEL.  11. Wear CLEAN PAJAMAS to bed the night before surgery  12. Place CLEAN SHEETS on your bed the night before your surgery  13. DO NOT SLEEP WITH PETS.   Day of Surgery: Shower with CHG soap as directed.  Do not shave 48 hours prior to surgery.  Men may shave face and neck.  Do not wear lotions, powders, perfumes/colognes, or deodorant.  Wear Clean/Comfortable clothing the morning of surgery  Remember to brush your teeth WITH YOUR REGULAR TOOTHPASTE.             Do not wear jewelry, make up, or nail polish  Do not bring valuables to the hospital.             [facility] is not responsible for any belongings or valuables.    Please read over the following fact sheets that you were given.

## 2020-10-05 ENCOUNTER — Other Ambulatory Visit: Payer: Self-pay

## 2020-10-05 ENCOUNTER — Encounter (HOSPITAL_COMMUNITY)
Admission: RE | Admit: 2020-10-05 | Discharge: 2020-10-05 | Disposition: A | Discharge status: Home / Self Care | Payer: Medicare HMO | Source: Ambulatory Visit | Attending: Orthopaedic Surgery | Admitting: Orthopaedic Surgery

## 2020-10-05 ENCOUNTER — Encounter (HOSPITAL_COMMUNITY): Payer: Self-pay

## 2020-10-05 DIAGNOSIS — Z01818 Encounter for other preprocedural examination: Secondary | ICD-10-CM | POA: Insufficient documentation

## 2020-10-05 DIAGNOSIS — Z20822 Contact with and (suspected) exposure to covid-19: Secondary | ICD-10-CM | POA: Diagnosis not present

## 2020-10-05 HISTORY — DX: Depression, unspecified: F32.A

## 2020-10-05 HISTORY — DX: Anxiety disorder, unspecified: F41.9

## 2020-10-05 HISTORY — DX: COVID-19: U07.1

## 2020-10-05 HISTORY — DX: Atherosclerotic heart disease of native coronary artery without angina pectoris: I25.10

## 2020-10-05 LAB — CBC
HCT: 46.1 % (ref 39.0–52.0)
Hemoglobin: 15.2 g/dL (ref 13.0–17.0)
MCH: 29.2 pg (ref 26.0–34.0)
MCHC: 33 g/dL (ref 30.0–36.0)
MCV: 88.5 fL (ref 80.0–100.0)
Platelets: 216 10*3/uL (ref 150–400)
RBC: 5.21 MIL/uL (ref 4.22–5.81)
RDW: 13.1 % (ref 11.5–15.5)
WBC: 9 10*3/uL (ref 4.0–10.5)
nRBC: 0 % (ref 0.0–0.2)

## 2020-10-05 LAB — BASIC METABOLIC PANEL
Anion gap: 6 (ref 5–15)
BUN: 16 mg/dL (ref 8–23)
CO2: 28 mmol/L (ref 22–32)
Calcium: 9.3 mg/dL (ref 8.9–10.3)
Chloride: 103 mmol/L (ref 98–111)
Creatinine, Ser: 0.89 mg/dL (ref 0.61–1.24)
GFR, Estimated: >60 mL/min (ref >60)
Glucose, Bld: 101 mg/dL — ABNORMAL HIGH (ref 70–99)
Potassium: 4.9 mmol/L (ref 3.5–5.1)
Sodium: 137 mmol/L (ref 135–145)

## 2020-10-05 LAB — TYPE AND SCREEN
ABO/RH(D): O POS
Antibody Screen: NEGATIVE

## 2020-10-05 LAB — SURGICAL PCR SCREEN
MRSA, PCR: NEGATIVE
Staphylococcus aureus: NEGATIVE

## 2020-10-05 LAB — SARS CORONAVIRUS 2 (TAT 6-24 HRS): SARS Coronavirus 2: NEGATIVE

## 2020-10-05 NOTE — Progress Notes (Signed)
PCP - Eunice Blase MD Cardiologist - denies  PPM/ICD - denies    Chest x-ray -  EKG - 10/05/20 Stress Test -06/10/17  ECHO - 06/10/17 Cardiac Cath -06/15/17   Sleep Study - denies CPAP - no  Fasting Blood Sugar - na Checks Blood Sugar no_____ times a day  Blood Thinner Instructions:na Aspirin Instructions:na  ERAS Protcol - clears until 0900 PRE-SURGERY Ensure or G2- Ensure given. Pt instructed to finish by 0900   COVID TEST-10/05/20    Anesthesia review: yes for cardiac history.  Patient denies shortness of breath, fever, cough and chest pain at PAT appointment   All instructions explained to the patient, with a verbal understanding of the material. Patient agrees to go over the instructions while at home for a better understanding. Patient also instructed to self quarantine after being tested for COVID-19. The opportunity to ask questions was provided.

## 2020-10-08 ENCOUNTER — Encounter (HOSPITAL_COMMUNITY): Payer: Self-pay

## 2020-10-08 ENCOUNTER — Telehealth: Payer: Self-pay | Admitting: *Deleted

## 2020-10-08 ENCOUNTER — Telehealth: Payer: Self-pay | Admitting: Family Medicine

## 2020-10-08 NOTE — Anesthesia Preprocedure Evaluation (Addendum)
Anesthesia Evaluation  Patient identified by MRN, date of birth, ID band Patient awake    Reviewed: Allergy & Precautions, NPO status , Patient's Chart, lab work & pertinent test results  History of Anesthesia Complications Negative for: history of anesthetic complications  Airway Mallampati: II  TM Distance: >3 FB Neck ROM: Full    Dental   Pulmonary neg pulmonary ROS, former smoker,    Pulmonary exam normal        Cardiovascular hypertension, Pt. on medications + CAD (nonobstructive by cath)  Normal cardiovascular exam     Neuro/Psych Anxiety Depression negative neurological ROS     GI/Hepatic negative GI ROS, Neg liver ROS,   Endo/Other  negative endocrine ROS  Renal/GU negative Renal ROS  negative genitourinary   Musculoskeletal  (+) Arthritis ,   Abdominal   Peds  Hematology negative hematology ROS (+)   Anesthesia Other Findings  Echo 06/10/17: Study Conclusions  - Left ventricle: The cavity size was normal. There was moderate  focal basal hypertrophy of the septum. Systolic function was  normal. The estimated ejection fraction was in the range of 50%  to 55%. There is hypokinesis of the apical myocardium. There is  hypokinesis of the apical anterior apical septal and inferior  myocardium.  - Mitral valve: Calcified annulus.  - Left atrium: The atrium was mildly dilated.   Cath 06/15/17: Conclusions: 1. Mild to moderate, non-obstructive coronary artery disease. Distal branches are small and diffusely diseased. 2. Normal left ventricular contraction. 3. Mildly elevated left ventricular filling pressure.   Reproductive/Obstetrics                           Anesthesia Physical Anesthesia Plan  ASA: II  Anesthesia Plan: Spinal   Post-op Pain Management:    Induction:   PONV Risk Score and Plan: 1 and Propofol infusion, Treatment may vary due to age or medical  condition, Ondansetron and TIVA  Airway Management Planned: Nasal Cannula and Simple Face Mask  Additional Equipment: None  Intra-op Plan:   Post-operative Plan:   Informed Consent: I have reviewed the patients History and Physical, chart, labs and discussed the procedure including the risks, benefits and alternatives for the proposed anesthesia with the patient or authorized representative who has indicated his/her understanding and acceptance.       Plan Discussed with:   Anesthesia Plan Comments: (PAT note written 10/08/2020 by Myra Gianotti, PA-C. )      Anesthesia Quick Evaluation

## 2020-10-08 NOTE — Telephone Encounter (Signed)
Ortho bundle pre-op call completed. Patient requested that his wife and her sister be allowed to come into hospital as his wife has short term memory issues related to head trauma and won't be able to stay in hospital waiting area alone. Please call [phone] for any questions related to this.

## 2020-10-08 NOTE — Progress Notes (Signed)
Anesthesia Chart Review:  Case: [id number] Date/Time: 10/09/20 1145   Procedure: LEFT TOTAL KNEE ARTHROPLASTY (Left Knee)   Anesthesia type: Spinal   Pre-op diagnosis: Osteoarthritis Left Knee   Location: [facility] ROOM 02 / [facility]   Surgeons: Mcarthur Rossetti, MD      DISCUSSION: Patient is a 64 year old male scheduled for the above procedure.  History includes former smoker (quit 06/16/98), HTN, CAD (mild-moderate non-obstructive CAD 06/15/17), anxiety, depression, skin cancer ([place]), [id number] (12/21/44), umbilical hernia (s/p repair 11/03/17 for incarcerated hernia). BMI is consistent with obesity.  As needed cardiology follow-up noted after 07/20/17 cardiology visit with Dr. Acie Fredrickson following 06/15/17 cath. Most recently, patient was evaluated by new PCP Dr. Junius Roads on 09/24/20 for preoperative evaluation prior to left TKR. He noted HTN generally well controlled on amlodipine 5 mg daily. Sober for 3 years. Anxiety controlled with Prozac. Subclinical hypothyroidism. Non-obstructive mild-moderate CAD by cath due to abnormal stress test 05/2017 and is asymptomatic.  He wrote, "Lyndel Safe perioperative risk is 0.2%.  He is cleared to proceed." 10/05/20 EKG showed NSR.  10/05/2020 presurgical COVID-19 test negative.  Anesthesia team to evaluate on the day of surgery.   VS: BP (!) 149/77   Pulse 70   Temp 36.9 C (Oral)   Resp 18   Ht 5\' 6"  (1.676 m)   Wt 111.7 kg   SpO2 99%   BMI 39.75 kg/m    PROVIDERS: Hilts, Michael, MD is listed as PCP. Established care 09/24/20.  Lynne Leader, MD is Sports Medicine provider - Mertie Moores, MD is cardiologist. Last visit 07/20/17. Work-up in late 2018 had been done for increased SOB. Myoview abnormal, so LHC done and showed mild-moderate CAD with "distal branches are small and diffusely diseased". Dyspnea felt due to obesity and deconditioning. Patient had lost 11 lbs and was doing better. He wrote, "He will follow up with his primary MD" with as needed cardiology  follow-up.   LABS: Labs reviewed: Acceptable for surgery. Thyroid panel 09/24/20 was normal.  (all labs ordered are listed, but only abnormal results are displayed)  Labs Reviewed  BASIC METABOLIC PANEL - Abnormal; Notable for the following components:      Result Value   Glucose, Bld 101 (*)    All other components within normal limits  SARS CORONAVIRUS 2 (TAT 6-24 HRS)  SURGICAL PCR SCREEN  CBC  TYPE AND SCREEN     IMAGES: MRI L-spine 11/19/19: IMPRESSION: 1. Lumbar spondylosis most pronounced at L4-5 where there is severe spinal canal stenosis and mild bilateral neural foraminal narrowing. 2. At L3-4 there is a small left foraminal central disc protrusion abutting the exiting left L3 nerve root. There is also mild to moderate spinal canal stenosis, narrowing of the subarticular zones and mild bilateral neural foraminal narrowing at this level. 3. Mild spinal canal stenosis and mild bilateral neural foraminal narrowing at L2-3. 4. Mild diffuse decrease of the T1 signal throughout the visualized spine, nonspecific, may represent red marrow reconversion.   EKG: 10/05/20: NSR   CV: Cardiac cath 06/15/17 (done following abnormal stress test 06/10/17): LM: Distal LM 20%. LAD: Moderate size. Distal LAD is a small vessel with tapering before the apex. Mid LAD 50%. D1 is small D2 is moderate size. LCX: OM1 and OM2 are small in size. OM3 is moderate in size with 30% stenosis.  RCA: Mid RCA 40%. Distal RCA 40%. Conclusions: 1. Mild to moderate, non-obstructive coronary artery disease. Distal branches are small and diffusely diseased. 2. Normal left  ventricular contraction. 3. Mildly elevated left ventricular filling pressure. Recommendations: 1. Medical therapy and risk factor modification to prevent progression of coronary artery disease. 2. Continue medical therapy for component of diastolic dysfunction.  Nuclear stress test 06/10/17:  Nuclear stress EF: 49%.  There was  no ST segment deviation noted during stress.  Defect 1: There is a small defect of moderate severity present in the basal inferior, mid anterior, mid inferior and apical anterior location.  The left ventricular ejection fraction is mildly decreased (45-54%).  This is a low risk study.  Abnormal, low risk stress nuclear study with probable distal anterior and inferior soft tissue attenuation; no ischemia; EF 49 with mild global hypokinesis and mild LVE. [Cath recommended]   Echo 06/10/17: Study Conclusions  - Left ventricle: The cavity size was normal. There was moderate  focal basal hypertrophy of the septum. Systolic function was  normal. The estimated ejection fraction was in the range of 50%  to 55%. There is hypokinesis of the apical myocardium. There is  hypokinesis of the apical anterior apical septal and inferior  myocardium.  - Mitral valve: Calcified annulus.  - Left atrium: The atrium was mildly dilated.    Past Medical History:  Diagnosis Date  . Anxiety   . Arthritis   . Basal cell carcinoma 04/29/2018   upper mid back, right mid back   . Basal cell carcinoma 04/29/2018   right mid back   . Basal cell carcinoma 06/10/2018   left lover back   . Coronary artery disease    Mild-moderate non-obstructive CAD 06/15/17 (20% dLM, 50% mLAD, 30% OM3, 40% mRCA, 40% dRCA)  . COVID-19 unknown  . Depression   . Former heavy cigarette smoker (20-39 per day) 03/05/2018   Quit 2000; 60 pak year history  . HTN (hypertension)     Past Surgical History:  Procedure Laterality Date  . HERNIA REPAIR    . KNEE ARTHROSCOPY Bilateral   . LEFT HEART CATH AND CORONARY ANGIOGRAPHY N/A 06/15/2017   Procedure: LEFT HEART CATH AND CORONARY ANGIOGRAPHY;  Surgeon: Nelva Bush, MD;  Location: [facility];  Service: Cardiovascular;  Laterality: N/A;  . TONSILLECTOMY    . UMBILICAL HERNIA REPAIR N/A 11/03/2017   Procedure: UMBILICAL HERNIA REPAIR;  Surgeon: Clovis Riley, MD;  Location: [facility];  Service: General;  Laterality: N/A;    MEDICATIONS: . amLODipine (NORVASC) 5 MG tablet  . FLUoxetine (PROZAC) 20 MG capsule  . Naproxen Sodium (ALEVE) 220 MG CAPS  . vitamin B-12 (CYANOCOBALAMIN) 1000 MCG tablet   No current facility-administered medications for this encounter.     Myra Gianotti, PA-C Surgical Short Stay/Anesthesiology [facility] Phone [phone] [facility] Phone [phone] 10/08/2020 2:02 PM

## 2020-10-08 NOTE — Care Plan (Signed)
RNCM call to patient to review his upcoming Left total knee replacement with Dr. Ninfa Linden on 10/09/20. Patient is an Ortho bundle patient through [facility] and is agreeable to case management. Reviewed all post-op instructions. Patient has a spouse that will be assisting after discharge. He will need a FWW and 3in1/BSC. Will make referral to [facility] for DME to be delivered to hospital prior to discharge. Anticipate HHPT will be needed after short hospital stay. Referral made to [facility] ([facility]). Will continue to follow for needs.

## 2020-10-08 NOTE — Telephone Encounter (Signed)
Pt is struggling with his back BUT is having his knee replacement surgery tomorrow. Wanted to ask Dr. Georgina Snell how much recovery time he needs for the knee before he sees [place] to start work on the back again.

## 2020-10-09 ENCOUNTER — Observation Stay (HOSPITAL_COMMUNITY)
Admission: RE | Admit: 2020-10-09 | Discharge: 2020-10-11 | Disposition: A | Discharge status: Home / Self Care | Payer: Medicare HMO | Source: Ambulatory Visit | Attending: Orthopaedic Surgery | Admitting: Orthopaedic Surgery

## 2020-10-09 ENCOUNTER — Ambulatory Visit (HOSPITAL_COMMUNITY): Payer: Medicare HMO | Admitting: Vascular Surgery

## 2020-10-09 ENCOUNTER — Encounter (HOSPITAL_COMMUNITY): Payer: Self-pay | Admitting: Orthopaedic Surgery

## 2020-10-09 ENCOUNTER — Ambulatory Visit (HOSPITAL_COMMUNITY): Payer: Medicare HMO | Admitting: Anesthesiology

## 2020-10-09 ENCOUNTER — Encounter (HOSPITAL_COMMUNITY)
Admission: RE | Disposition: A | Discharge status: Home / Self Care | Payer: Self-pay | Source: Ambulatory Visit | Attending: Orthopaedic Surgery

## 2020-10-09 ENCOUNTER — Other Ambulatory Visit: Payer: Self-pay

## 2020-10-09 ENCOUNTER — Observation Stay (HOSPITAL_COMMUNITY): Payer: Medicare HMO

## 2020-10-09 DIAGNOSIS — E039 Hypothyroidism, unspecified: Secondary | ICD-10-CM | POA: Diagnosis not present

## 2020-10-09 DIAGNOSIS — Z8616 Personal history of COVID-19: Secondary | ICD-10-CM | POA: Insufficient documentation

## 2020-10-09 DIAGNOSIS — Z96652 Presence of left artificial knee joint: Secondary | ICD-10-CM

## 2020-10-09 DIAGNOSIS — Z87891 Personal history of nicotine dependence: Secondary | ICD-10-CM | POA: Insufficient documentation

## 2020-10-09 DIAGNOSIS — I10 Essential (primary) hypertension: Secondary | ICD-10-CM | POA: Diagnosis not present

## 2020-10-09 DIAGNOSIS — Z85828 Personal history of other malignant neoplasm of skin: Secondary | ICD-10-CM | POA: Insufficient documentation

## 2020-10-09 DIAGNOSIS — G8918 Other acute postprocedural pain: Secondary | ICD-10-CM | POA: Diagnosis not present

## 2020-10-09 DIAGNOSIS — I2510 Atherosclerotic heart disease of native coronary artery without angina pectoris: Secondary | ICD-10-CM | POA: Diagnosis not present

## 2020-10-09 DIAGNOSIS — F418 Other specified anxiety disorders: Secondary | ICD-10-CM | POA: Diagnosis not present

## 2020-10-09 DIAGNOSIS — M1712 Unilateral primary osteoarthritis, left knee: Principal | ICD-10-CM | POA: Insufficient documentation

## 2020-10-09 HISTORY — PX: TOTAL KNEE ARTHROPLASTY: SHX125

## 2020-10-09 LAB — ABO/RH: ABO/RH(D): O POS

## 2020-10-09 SURGERY — ARTHROPLASTY, KNEE, TOTAL
Anesthesia: Spinal | Site: Knee | Laterality: Left

## 2020-10-09 MED ORDER — ALUM & MAG HYDROXIDE-SIMETH 200-200-20 MG/5ML PO SUSP: 30.0000 mL | ORAL | Status: DC | PRN

## 2020-10-09 MED ORDER — DEXAMETHASONE SODIUM PHOSPHATE 10 MG/ML IJ SOLN
INTRAMUSCULAR | Status: AC
  Filled 2020-10-09: qty 1

## 2020-10-09 MED ORDER — ZOLPIDEM TARTRATE 5 MG PO TABS: 5.0000 mg | ORAL_TABLET | Freq: Every evening | ORAL | Status: DC | PRN

## 2020-10-09 MED ORDER — MIDAZOLAM HCL 2 MG/2ML IJ SOLN
INTRAMUSCULAR | Status: AC
  Administered 2020-10-09: 2 mg via INTRAVENOUS
  Filled 2020-10-09: qty 2

## 2020-10-09 MED ORDER — DOCUSATE SODIUM 100 MG PO CAPS
100.0000 mg | ORAL_CAPSULE | Freq: Two times a day (BID) | ORAL | Status: DC
  Administered 2020-10-09 – 2020-10-11 (×4): 100 mg via ORAL
  Filled 2020-10-09 (×4): qty 1

## 2020-10-09 MED ORDER — ASPIRIN 81 MG PO CHEW
81.0000 mg | CHEWABLE_TABLET | Freq: Two times a day (BID) | ORAL | Status: DC
  Administered 2020-10-09 – 2020-10-11 (×4): 81 mg via ORAL
  Filled 2020-10-09 (×4): qty 1

## 2020-10-09 MED ORDER — DEXAMETHASONE SODIUM PHOSPHATE 10 MG/ML IJ SOLN
INTRAMUSCULAR | Status: DC | PRN
  Administered 2020-10-09: 5 mg via INTRAVENOUS

## 2020-10-09 MED ORDER — DIPHENHYDRAMINE HCL 12.5 MG/5ML PO ELIX
12.5000 mg | ORAL_SOLUTION | ORAL | Status: DC | PRN
  Filled 2020-10-09: qty 10

## 2020-10-09 MED ORDER — OXYCODONE HCL 5 MG PO TABS
5.0000 mg | ORAL_TABLET | ORAL | Status: DC | PRN
  Administered 2020-10-09 – 2020-10-10 (×3): 10 mg via ORAL
  Filled 2020-10-09 (×4): qty 2

## 2020-10-09 MED ORDER — AMLODIPINE BESYLATE 5 MG PO TABS
5.0000 mg | ORAL_TABLET | Freq: Every day | ORAL | Status: DC
  Administered 2020-10-10 – 2020-10-11 (×2): 5 mg via ORAL
  Filled 2020-10-09 (×2): qty 1

## 2020-10-09 MED ORDER — CEFAZOLIN SODIUM-DEXTROSE 2-4 GM/100ML-% IV SOLN
2.0000 g | INTRAVENOUS | Status: AC
  Administered 2020-10-09: 2 g via INTRAVENOUS
  Filled 2020-10-09: qty 100

## 2020-10-09 MED ORDER — MENTHOL 3 MG MT LOZG
1.0000 | LOZENGE | OROMUCOSAL | Status: DC | PRN
  Filled 2020-10-09: qty 9

## 2020-10-09 MED ORDER — VITAMIN B-12 1000 MCG PO TABS
1000.0000 ug | ORAL_TABLET | Freq: Every day | ORAL | Status: DC
  Administered 2020-10-10 – 2020-10-11 (×2): 1000 ug via ORAL
  Filled 2020-10-09 (×2): qty 1

## 2020-10-09 MED ORDER — SODIUM CHLORIDE 0.9 % IV SOLN
1.0000 g | Freq: Four times a day (QID) | INTRAVENOUS | Status: AC
  Administered 2020-10-09 – 2020-10-10 (×2): 1 g via INTRAVENOUS
  Filled 2020-10-09 (×2): qty 10

## 2020-10-09 MED ORDER — ONDANSETRON HCL 4 MG PO TABS
4.0000 mg | ORAL_TABLET | Freq: Four times a day (QID) | ORAL | Status: DC | PRN
  Administered 2020-10-10: 4 mg via ORAL
  Filled 2020-10-09: qty 1

## 2020-10-09 MED ORDER — LACTATED RINGERS IV SOLN: INTRAVENOUS | Status: DC

## 2020-10-09 MED ORDER — FLUOXETINE HCL 20 MG PO CAPS
20.0000 mg | ORAL_CAPSULE | Freq: Every day | ORAL | Status: DC
  Administered 2020-10-10 – 2020-10-11 (×2): 20 mg via ORAL
  Filled 2020-10-09 (×2): qty 1

## 2020-10-09 MED ORDER — HYDROMORPHONE HCL 1 MG/ML IJ SOLN
0.5000 mg | INTRAMUSCULAR | Status: DC | PRN
  Administered 2020-10-09 – 2020-10-11 (×8): 1 mg via INTRAVENOUS
  Filled 2020-10-09 (×8): qty 1

## 2020-10-09 MED ORDER — FENTANYL CITRATE (PF) 100 MCG/2ML IJ SOLN
INTRAMUSCULAR | Status: AC
  Administered 2020-10-09: 100 ug via INTRAVENOUS
  Filled 2020-10-09: qty 2

## 2020-10-09 MED ORDER — ONDANSETRON HCL 4 MG/2ML IJ SOLN
INTRAMUSCULAR | Status: DC | PRN
  Administered 2020-10-09: 4 mg via INTRAVENOUS

## 2020-10-09 MED ORDER — MIDAZOLAM HCL 2 MG/2ML IJ SOLN: 2.0000 mg | Freq: Once | INTRAMUSCULAR | Status: AC

## 2020-10-09 MED ORDER — POVIDONE-IODINE 10 % EX SWAB
2.0000 "application " | Freq: Once | CUTANEOUS | Status: AC
  Administered 2020-10-09: 2 via TOPICAL

## 2020-10-09 MED ORDER — CHLORHEXIDINE GLUCONATE 0.12 % MT SOLN
15.0000 mL | Freq: Once | OROMUCOSAL | Status: AC
  Administered 2020-10-09: 15 mL via OROMUCOSAL
  Filled 2020-10-09: qty 15

## 2020-10-09 MED ORDER — FENTANYL CITRATE (PF) 100 MCG/2ML IJ SOLN: 100.0000 ug | Freq: Once | INTRAMUSCULAR | Status: AC

## 2020-10-09 MED ORDER — METHOCARBAMOL 1000 MG/10ML IJ SOLN
500.0000 mg | Freq: Four times a day (QID) | INTRAVENOUS | Status: DC | PRN
  Administered 2020-10-10: 500 mg via INTRAVENOUS
  Filled 2020-10-09 (×3): qty 5

## 2020-10-09 MED ORDER — PANTOPRAZOLE SODIUM 40 MG PO TBEC
40.0000 mg | DELAYED_RELEASE_TABLET | Freq: Every day | ORAL | Status: DC
  Administered 2020-10-09 – 2020-10-11 (×3): 40 mg via ORAL
  Filled 2020-10-09 (×3): qty 1

## 2020-10-09 MED ORDER — PHENOL 1.4 % MT LIQD: 1.0000 | OROMUCOSAL | Status: DC | PRN

## 2020-10-09 MED ORDER — OXYCODONE HCL 5 MG PO TABS
10.0000 mg | ORAL_TABLET | ORAL | Status: DC | PRN
  Administered 2020-10-10: 10 mg via ORAL
  Administered 2020-10-10 – 2020-10-11 (×5): 15 mg via ORAL
  Filled 2020-10-09: qty 3
  Filled 2020-10-09: qty 2
  Filled 2020-10-09 (×4): qty 3

## 2020-10-09 MED ORDER — ROPIVACAINE HCL 5 MG/ML IJ SOLN
INTRAMUSCULAR | Status: DC | PRN
  Administered 2020-10-09: 30 mL via PERINEURAL

## 2020-10-09 MED ORDER — SODIUM CHLORIDE 0.9 % IR SOLN
Status: DC | PRN
  Administered 2020-10-09: 3000 mL

## 2020-10-09 MED ORDER — TRANEXAMIC ACID-NACL 1000-0.7 MG/100ML-% IV SOLN
INTRAVENOUS | Status: AC
  Filled 2020-10-09: qty 100

## 2020-10-09 MED ORDER — SENNOSIDES-DOCUSATE SODIUM 8.6-50 MG PO TABS: 1.0000 | ORAL_TABLET | Freq: Every evening | ORAL | Status: DC | PRN

## 2020-10-09 MED ORDER — PHENYLEPHRINE HCL-NACL 10-0.9 MG/250ML-% IV SOLN
INTRAVENOUS | Status: DC | PRN
  Administered 2020-10-09: 25 ug/min via INTRAVENOUS

## 2020-10-09 MED ORDER — HYDROXYZINE HCL 50 MG/ML IM SOLN
50.0000 mg | Freq: Four times a day (QID) | INTRAMUSCULAR | Status: DC | PRN
  Administered 2020-10-09: 50 mg via INTRAMUSCULAR
  Filled 2020-10-09 (×2): qty 1

## 2020-10-09 MED ORDER — METOCLOPRAMIDE HCL 5 MG PO TABS: 5.0000 mg | ORAL_TABLET | Freq: Three times a day (TID) | ORAL | Status: DC | PRN

## 2020-10-09 MED ORDER — ORAL CARE MOUTH RINSE: 15.0000 mL | Freq: Once | OROMUCOSAL | Status: AC

## 2020-10-09 MED ORDER — ONDANSETRON HCL 4 MG/2ML IJ SOLN
INTRAMUSCULAR | Status: AC
  Filled 2020-10-09: qty 2

## 2020-10-09 MED ORDER — METOCLOPRAMIDE HCL 5 MG/ML IJ SOLN: 5.0000 mg | Freq: Three times a day (TID) | INTRAMUSCULAR | Status: DC | PRN

## 2020-10-09 MED ORDER — 0.9 % SODIUM CHLORIDE (POUR BTL) OPTIME
TOPICAL | Status: DC | PRN
  Administered 2020-10-09: 1000 mL

## 2020-10-09 MED ORDER — TRANEXAMIC ACID-NACL 1000-0.7 MG/100ML-% IV SOLN
1000.0000 mg | INTRAVENOUS | Status: AC
  Administered 2020-10-09: 1000 mg via INTRAVENOUS
  Filled 2020-10-09: qty 100

## 2020-10-09 MED ORDER — ONDANSETRON HCL 4 MG/2ML IJ SOLN: 4.0000 mg | Freq: Four times a day (QID) | INTRAMUSCULAR | Status: DC | PRN

## 2020-10-09 MED ORDER — PROPOFOL 500 MG/50ML IV EMUL
INTRAVENOUS | Status: DC | PRN
  Administered 2020-10-09: 125 ug/kg/min via INTRAVENOUS

## 2020-10-09 MED ORDER — FENTANYL CITRATE (PF) 250 MCG/5ML IJ SOLN
INTRAMUSCULAR | Status: AC
  Filled 2020-10-09: qty 5

## 2020-10-09 MED ORDER — SODIUM CHLORIDE 0.9 % IV SOLN: INTRAVENOUS | Status: DC

## 2020-10-09 MED ORDER — FENTANYL CITRATE (PF) 250 MCG/5ML IJ SOLN
INTRAMUSCULAR | Status: DC | PRN
  Administered 2020-10-09: 50 ug via INTRAVENOUS

## 2020-10-09 MED ORDER — ACETAMINOPHEN 325 MG PO TABS: 325.0000 mg | ORAL_TABLET | Freq: Four times a day (QID) | ORAL | Status: DC | PRN

## 2020-10-09 MED ORDER — METHOCARBAMOL 500 MG PO TABS
500.0000 mg | ORAL_TABLET | Freq: Four times a day (QID) | ORAL | Status: DC | PRN
  Administered 2020-10-09 – 2020-10-11 (×4): 500 mg via ORAL
  Filled 2020-10-09 (×4): qty 1

## 2020-10-09 MED ORDER — EPHEDRINE SULFATE-NACL 50-0.9 MG/10ML-% IV SOSY: PREFILLED_SYRINGE | INTRAVENOUS | Status: DC | PRN

## 2020-10-09 SURGICAL SUPPLY — 64 items
BANDAGE ESMARK 6X9 LF (GAUZE/BANDAGES/DRESSINGS) ×1 IMPLANT
BEARING TIBIAL KNEE INSRT SZ 5 (Insert) ×1 IMPLANT
BLADE SAG 18X100X1.27 (BLADE) ×2 IMPLANT
BNDG ELASTIC 6X5.8 VLCR STR LF (GAUZE/BANDAGES/DRESSINGS) ×4 IMPLANT
BNDG ESMARK 6X9 LF (GAUZE/BANDAGES/DRESSINGS) ×2
BOWL SMART MIX CTS (DISPOSABLE) ×2 IMPLANT
COVER SURGICAL LIGHT HANDLE (MISCELLANEOUS) ×2 IMPLANT
COVER WAND RF STERILE (DRAPES) ×2 IMPLANT
CUFF TOURN SGL QUICK 34 (TOURNIQUET CUFF) ×1
CUFF TOURN SGL QUICK 42 (TOURNIQUET CUFF) IMPLANT
CUFF TRNQT CYL 34X4.125X (TOURNIQUET CUFF) ×1 IMPLANT
DRAPE EXTREMITY T 121X128X90 (DISPOSABLE) ×2 IMPLANT
DRAPE HALF SHEET 40X57 (DRAPES) ×2 IMPLANT
DRAPE U-SHAPE 47X51 STRL (DRAPES) ×2 IMPLANT
DRSG PAD ABDOMINAL 8X10 ST (GAUZE/BANDAGES/DRESSINGS) ×2 IMPLANT
DURAPREP 26ML APPLICATOR (WOUND CARE) ×2 IMPLANT
ELECT CAUTERY BLADE 6.4 (BLADE) ×2 IMPLANT
ELECT REM PT RETURN 9FT ADLT (ELECTROSURGICAL) ×2
ELECTRODE REM PT RTRN 9FT ADLT (ELECTROSURGICAL) ×1 IMPLANT
FACESHIELD WRAPAROUND (MASK) ×4 IMPLANT
FEMORAL POSTERIOR SZ6 LFT (Femur) ×1 IMPLANT
GAUZE SPONGE 4X4 12PLY STRL (GAUZE/BANDAGES/DRESSINGS) ×2 IMPLANT
GAUZE XEROFORM 1X8 LF (GAUZE/BANDAGES/DRESSINGS) ×2 IMPLANT
GLOVE ORTHO TXT STRL SZ7.5 (GLOVE) ×2 IMPLANT
GLOVE SRG 8 PF TXTR STRL LF DI (GLOVE) ×2 IMPLANT
GLOVE SURG ORTHO 8.0 STRL STRW (GLOVE) ×2 IMPLANT
GLOVE SURG UNDER POLY LF SZ8 (GLOVE) ×2
GOWN STRL REUS W/ TWL LRG LVL3 (GOWN DISPOSABLE) IMPLANT
GOWN STRL REUS W/ TWL XL LVL3 (GOWN DISPOSABLE) ×2 IMPLANT
GOWN STRL REUS W/TWL LRG LVL3 (GOWN DISPOSABLE)
GOWN STRL REUS W/TWL XL LVL3 (GOWN DISPOSABLE) ×2
HANDPIECE INTERPULSE COAX TIP (DISPOSABLE) ×1
IMMOBILIZER KNEE 22 UNIV (SOFTGOODS) ×2 IMPLANT
KIT BASIN OR (CUSTOM PROCEDURE TRAY) ×2 IMPLANT
KIT TURNOVER KIT B (KITS) ×2 IMPLANT
KNEE PATELLA ASYMMETRIC 10X32 (Knees) ×2 IMPLANT
KNEE TIBIAL COMPONENT SZ5 (Knees) ×2 IMPLANT
MANIFOLD NEPTUNE II (INSTRUMENTS) ×2 IMPLANT
NEEDLE 18GX1X1/2 (RX/OR ONLY) (NEEDLE) IMPLANT
NS IRRIG 1000ML POUR BTL (IV SOLUTION) ×2 IMPLANT
PACK TOTAL JOINT (CUSTOM PROCEDURE TRAY) ×2 IMPLANT
PAD ARMBOARD 7.5X6 YLW CONV (MISCELLANEOUS) ×2 IMPLANT
PADDING CAST COTTON 6X4 STRL (CAST SUPPLIES) ×2 IMPLANT
PIN FLUTED HEDLESS FIX 3.5X1/8 (PIN) ×2 IMPLANT
POSTERIOR FEMORAL SZ6 LFT (Femur) ×2 IMPLANT
SET HNDPC FAN SPRY TIP SCT (DISPOSABLE) ×1 IMPLANT
SET PAD KNEE POSITIONER (MISCELLANEOUS) ×2 IMPLANT
STAPLER VISISTAT 35W (STAPLE) ×2 IMPLANT
STRIP CLOSURE SKIN 1/2X4 (GAUZE/BANDAGES/DRESSINGS) IMPLANT
SUCTION FRAZIER HANDLE 10FR (MISCELLANEOUS) ×1
SUCTION TUBE FRAZIER 10FR DISP (MISCELLANEOUS) ×1 IMPLANT
SUT MNCRL AB 4-0 PS2 18 (SUTURE) IMPLANT
SUT VIC AB 0 CT1 27 (SUTURE) ×1
SUT VIC AB 0 CT1 27XBRD ANBCTR (SUTURE) ×1 IMPLANT
SUT VIC AB 1 CT1 27 (SUTURE) ×2
SUT VIC AB 1 CT1 27XBRD ANBCTR (SUTURE) ×2 IMPLANT
SUT VIC AB 2-0 CT1 27 (SUTURE) ×2
SUT VIC AB 2-0 CT1 TAPERPNT 27 (SUTURE) ×2 IMPLANT
SYR 50ML LL SCALE MARK (SYRINGE) IMPLANT
TIBIAL BEARIN KNEE INSERT SZ 5 (Insert) ×2 IMPLANT
TOWEL GREEN STERILE (TOWEL DISPOSABLE) ×2 IMPLANT
TOWEL GREEN STERILE FF (TOWEL DISPOSABLE) ×2 IMPLANT
TRAY CATH 16FR W/PLASTIC CATH (SET/KITS/TRAYS/PACK) IMPLANT
WRAP KNEE MAXI GEL POST OP (GAUZE/BANDAGES/DRESSINGS) ×2 IMPLANT

## 2020-10-09 NOTE — Transfer of Care (Signed)
Immediate Anesthesia Transfer of Care Note  Patient: Samuel [name]  Procedure(s) Performed: LEFT TOTAL KNEE ARTHROPLASTY (Left Knee)  Patient Location: PACU  Anesthesia Type:MAC and Spinal  Level of Consciousness: awake, alert  and oriented  Airway & Oxygen Therapy: Patient Spontanous Breathing and Patient connected to face mask oxygen  Post-op Assessment: Report given to RN and Post -op Vital signs reviewed and stable  Post vital signs: Reviewed and stable  Last Vitals:  Vitals Value Taken Time  BP 120/75 10/09/20 1531  Temp    Pulse 67 10/09/20 1534  Resp 18 10/09/20 1534  SpO2 100 % 10/09/20 1534  Vitals shown include unvalidated device data.  Last Pain:  Vitals:   10/09/20 1016  TempSrc:   PainSc: 0-No pain      Patients Stated Pain Goal: 3 (36/12/24 4975)  Complications: No complications documented.

## 2020-10-09 NOTE — Telephone Encounter (Signed)
Probably 6 weeks.  However that may change based on how you are doing with recovery from surgery.

## 2020-10-09 NOTE — Brief Op Note (Signed)
10/09/2020  3:07 PM  PATIENT:  Samuel [name]  65 y.o. male  PRE-OPERATIVE DIAGNOSIS:  Osteoarthritis Left Knee  POST-OPERATIVE DIAGNOSIS:  Osteoarthritis Left Knee  PROCEDURE:  Procedure(s): LEFT TOTAL KNEE ARTHROPLASTY (Left)  SURGEON:  Surgeon(s) and Role:    Mcarthur Rossetti, MD - Primary  PHYSICIAN ASSISTANT:  Benita Stabile, PA-C  ANESTHESIA:   regional and spinal  EBL:  50 mL   COUNTS:  YES  TOURNIQUET:   Total Tourniquet Time Documented: Thigh (Left) - 60 minutes Total: Thigh (Left) - 60 minutes   DICTATION: .Other Dictation: Dictation Number [id number]  PLAN OF CARE: Admit for overnight observation  PATIENT DISPOSITION:  PACU - hemodynamically stable.   Delay start of Pharmacological VTE agent (>24hrs) due to surgical blood loss or risk of bleeding: no

## 2020-10-09 NOTE — Evaluation (Signed)
Physical Therapy Evaluation Patient Details Name: Samuel [name] MRN: [id number] DOB: 1956/04/21 Today's Date: 10/09/2020   History of Present Illness  Pt adm 4/26 for lt tkr. PMH - arthritis, cad, htn  Clinical Impression  Pt presents to PT with limited mobility due pain and post op nausea. Unable to go beyond EOB due to nausea and pain. Expect pt will make good progress when pain and nausea improved.     Follow Up Recommendations Follow surgeon's recommendation for DC plan and follow-up therapies    Equipment Recommendations  3in1 (PT);Rolling walker with 5" wheels    Recommendations for Other Services       Precautions / Restrictions Precautions Precautions: Fall;Knee      Mobility  Bed Mobility Overal bed mobility: Needs Assistance Bed Mobility: Supine to Sit;Sit to Supine     Supine to sit: Min assist Sit to supine: Min assist   General bed mobility comments: Assist for LLE    Transfers                 General transfer comment: Pt unable due to pain and nausea  Ambulation/Gait             General Gait Details: Pt unable due to pain and nausea  Stairs            Wheelchair Mobility    Modified Rankin (Stroke Patients Only)       Balance Overall balance assessment: No apparent balance deficits (not formally assessed)                                           Pertinent Vitals/Pain Pain Assessment: Faces Faces Pain Scale: Hurts whole lot Pain Location: lt knee Pain Descriptors / Indicators: Grimacing;Guarding;Aching Pain Intervention(s): Limited activity within patient's tolerance;Monitored during session;Repositioned;Patient requesting pain meds-RN notified;Ice applied    Home Living Family/patient expects to be discharged to:: Private residence Living Arrangements: Spouse/significant other Available Help at Discharge: Family;Available 24 hours/day Type of Home: House Home Access: Stairs to enter Entrance  Stairs-Rails: Right Entrance Stairs-Number of Steps: 2 Home Layout: One level Home Equipment: None      Prior Function Level of Independence: Independent               Hand Dominance        Extremity/Trunk Assessment   Upper Extremity Assessment Upper Extremity Assessment: Overall WFL for tasks assessed    Lower Extremity Assessment Lower Extremity Assessment: LLE deficits/detail LLE Deficits / Details: Fair quad set, Assist for straight leg raiseAAROM 10-75 degrees       Communication   Communication: HOH  Cognition Arousal/Alertness: Awake/alert Behavior During Therapy: WFL for tasks assessed/performed Overall Cognitive Status: Within Functional Limits for tasks assessed                                        General Comments      Exercises     Assessment/Plan    PT Assessment Patient needs continued PT services  PT Problem List Decreased strength;Decreased range of motion;Decreased activity tolerance;Decreased mobility       PT Treatment Interventions DME instruction;Gait training;Stair training;Functional mobility training;Therapeutic activities;Therapeutic exercise;Patient/family education    PT Goals (Current goals can be found in the Care Plan section)  Acute Rehab PT Goals Patient  Stated Goal: Decr pain and nausea PT Goal Formulation: With patient Time For Goal Achievement: 10/12/20 Potential to Achieve Goals: Good    Frequency 7X/week   Barriers to discharge        Co-evaluation               AM-PAC PT "6 Clicks" Mobility  Outcome Measure Help needed turning from your back to your side while in a flat bed without using bedrails?: None Help needed moving from lying on your back to sitting on the side of a flat bed without using bedrails?: A Little Help needed moving to and from a bed to a chair (including a wheelchair)?: Total Help needed standing up from a chair using your arms (e.g., wheelchair or bedside  chair)?: Total Help needed to walk in hospital room?: Total Help needed climbing 3-5 steps with a railing? : Total 6 Click Score: 11    End of Session Equipment Utilized During Treatment: Gait belt Activity Tolerance: Other (comment);Patient limited by pain (Limited by nausea) Patient left: in bed;with call bell/phone within reach;with family/visitor present Nurse Communication: Mobility status;Other (comment);Patient requests pain meds (pain and nausea) PT Visit Diagnosis: Other abnormalities of gait and mobility (R26.89);Pain Pain - Right/Left: Left Pain - part of body: Knee    Time: 1600-1620 PT Time Calculation (min) (ACUTE ONLY): 20 min   Charges:   PT Evaluation $PT Eval Low Complexity: 1 Low          [place] Pager [phone] Office [place] 10/09/2020, 6:38 PM

## 2020-10-09 NOTE — Anesthesia Postprocedure Evaluation (Signed)
Anesthesia Post Note  Patient: Samuel [name]  Procedure(s) Performed: LEFT TOTAL KNEE ARTHROPLASTY (Left Knee)     Patient location during evaluation: PACU Anesthesia Type: Spinal Level of consciousness: oriented and awake and alert Pain management: pain level controlled Vital Signs Assessment: post-procedure vital signs reviewed and stable Respiratory status: spontaneous breathing, respiratory function stable and nonlabored ventilation Cardiovascular status: blood pressure returned to baseline and stable Postop Assessment: no headache, no backache, no apparent nausea or vomiting and spinal receding Anesthetic complications: no   No complications documented.  Last Vitals:  Vitals:   10/09/20 1601 10/09/20 1616  BP: 126/80 133/73  Pulse: 70 66  Resp: (!) 26 (!) 22  Temp: (!) 36.1 C (!) 36.2 C  SpO2: 99% 100%    Last Pain:  Vitals:   10/09/20 1616  TempSrc:   PainSc: 0-No pain                 Lidia Collum

## 2020-10-09 NOTE — Care Plan (Signed)
Ortho Bundle Case Management Note  Patient Details  Name: Samuel [name] MRN: [id number] Date of Birth: Nov 02, 1955    [facility] call to patient to review his upcoming Left total knee replacement with Dr. Ninfa Linden on 10/09/20. Patient is an Ortho bundle patient through [facility] and is agreeable to case management. Reviewed all post-op instructions. Patient has a spouse that will be assisting after discharge. He will need a FWW and 3in1/BSC. Will make referral to [place] for DME to be delivered to hospital prior to discharge. Anticipate HHPT will be needed after short hospital stay. Referral made to [facility] ([facility]). Will continue to follow for [name].          DME Arranged:  3-N-1,Walker rolling DME Agency:  [facility]  HH Arranged:  PT HH Agency:  [facility] (now [facility])  Additional Comments: Please contact me with any questions of if this plan should need to change.  Jamse Arn, RN, BSN, [employer]  [phone] 10/09/2020, 4:07 PM

## 2020-10-09 NOTE — Anesthesia Procedure Notes (Signed)
Anesthesia Regional Block: Adductor canal block   Pre-Anesthetic Checklist: ,, timeout performed, Correct Patient, Correct Site, Correct Laterality, Correct Procedure, Correct Position, site marked, Risks and benefits discussed,  Surgical consent,  Pre-op evaluation,  At surgeon's request and post-op pain management  Laterality: Left  Prep: chloraprep       Needles:  Injection technique: Single-shot  Needle Type: Echogenic Stimulator Needle     Needle Length: 10cm  Needle Gauge: 20     Additional Needles:   Procedures:,,,, ultrasound used (permanent image in chart),,,,  Narrative:  Start time: 10/09/2020 11:21 AM End time: 10/09/2020 11:25 AM Injection made incrementally with aspirations every 5 mL.  Performed by: Personally  Anesthesiologist: Lidia Collum, MD  Additional Notes: Standard monitors applied. Skin prepped. Good needle visualization with ultrasound. Injection made in 5cc increments with no resistance to injection. Patient tolerated the procedure well.

## 2020-10-09 NOTE — H&P (Signed)
TOTAL KNEE ADMISSION H&P  Patient is being admitted for left total knee arthroplasty.  Subjective:  Chief Complaint:left knee pain.  HPI: Samuel [name], 65 y.o. male, has a history of pain and functional disability in the left knee due to arthritis and has failed non-surgical conservative treatments for greater than 12 weeks to includeNSAID's and/or analgesics, corticosteriod injections, viscosupplementation injections, flexibility and strengthening excercises, use of assistive devices, weight reduction as appropriate and activity modification.  Onset of symptoms was gradual, starting 5 years ago with gradually worsening course since that time. The patient noted no past surgery on the left knee(s).  Patient currently rates pain in the left knee(s) at 10 out of 10 with activity. Patient has night pain, worsening of pain with activity and weight bearing, pain that interferes with activities of daily living, pain with passive range of motion, crepitus and joint swelling.  Patient has evidence of subchondral sclerosis, periarticular osteophytes and joint space narrowing by imaging studies. There is no active infection.  Patient Active Problem List   Diagnosis Date Noted  . Unilateral primary osteoarthritis, left knee 06/22/2018  . Unilateral primary osteoarthritis, right knee 06/22/2018  . Screening for colorectal cancer 05/17/2018  . Nonobstructive atherosclerosis of coronary artery 03/05/2018  . Obesity (BMI 30-39.9) 03/05/2018  . Former heavy cigarette smoker (20-39 per day) 03/05/2018  . Varicose veins of bilateral lower extremities with pain 12/28/2017  . Umbilical hernia 19/14/7829  . Alcohol abuse, in remission 09/17/2017  . Osteoarthritis of knee 08/10/2017  . Rosacea 06/11/2017  . Subclinical hypothyroidism 06/05/2017  . Essential hypertension 05/27/2017  . Bilateral hearing loss 05/04/2014   Past Medical History:  Diagnosis Date  . Anxiety   . Arthritis   . Basal cell carcinoma  04/29/2018   upper mid back, right mid back   . Basal cell carcinoma 04/29/2018   right mid back   . Basal cell carcinoma 06/10/2018   left lover back   . Coronary artery disease    Mild-moderate non-obstructive CAD 06/15/17 (20% dLM, 50% mLAD, 30% OM3, 40% mRCA, 40% dRCA)  . COVID-19 unknown  . Depression   . Former heavy cigarette smoker (20-39 per day) 03/05/2018   Quit 2000; 60 pak year history  . HTN (hypertension)     Past Surgical History:  Procedure Laterality Date  . HERNIA REPAIR    . KNEE ARTHROSCOPY Bilateral   . LEFT HEART CATH AND CORONARY ANGIOGRAPHY N/A 06/15/2017   Procedure: LEFT HEART CATH AND CORONARY ANGIOGRAPHY;  Surgeon: Nelva Bush, MD;  Location: [facility];  Service: Cardiovascular;  Laterality: N/A;  . TONSILLECTOMY    . UMBILICAL HERNIA REPAIR N/A 11/03/2017   Procedure: UMBILICAL HERNIA REPAIR;  Surgeon: Clovis Riley, MD;  Location: [facility];  Service: General;  Laterality: N/A;    Current Facility-Administered Medications  Medication Dose Route Frequency Provider Last Rate Last Admin  . ceFAZolin (ANCEF) IVPB 2g/100 mL premix  2 g Intravenous On Call to OR Pete Pelt, PA-C      . fentaNYL (SUBLIMAZE) 100 MCG/2ML injection           . lactated ringers infusion   Intravenous Continuous Suzette Battiest, MD 10 mL/hr at 10/09/20 1026 New Bag at 10/09/20 1026  . midazolam (VERSED) 2 MG/2ML injection           . tranexamic acid (CYKLOKAPRON) IVPB 1,000 mg  1,000 mg Intravenous To OR Pete Pelt, PA-C       No Known Allergies  Social History   Tobacco Use  . Smoking status: Former Smoker    Packs/day: 2.00    Years: 30.00    Pack years: 60.00    Types: Cigarettes    Quit date: 06/16/1998    Years since quitting: 22.3  . Smokeless tobacco: Never Used  Substance Use Topics  . Alcohol use: No    Comment: sober 08-28-2017    Family History  Problem Relation Age of Onset  . Dementia Mother        alcohol  . Alcohol abuse  Mother   . Heart attack Father 22  . Heart disease Father 68  . COPD Father   . Stroke Father   . Healthy Daughter   . Healthy Son   . Healthy Son   . CAD Brother   . Heart attack Brother      Review of Systems  Musculoskeletal: Positive for gait problem and joint swelling.  All other systems reviewed and are negative.   Objective:  Physical Exam Vitals reviewed.  Constitutional:      Appearance: Normal appearance.  HENT:     Head: Normocephalic and atraumatic.  Eyes:     Extraocular Movements: Extraocular movements intact.     Pupils: Pupils are equal, round, and reactive to light.  Cardiovascular:     Rate and Rhythm: Normal rate.     Pulses: Normal pulses.  Pulmonary:     Effort: Pulmonary effort is normal.     Breath sounds: Normal breath sounds.  Abdominal:     Palpations: Abdomen is soft.  Musculoskeletal:     Cervical back: Normal range of motion.     Left knee: Effusion, bony tenderness and crepitus present. Decreased range of motion. Tenderness present over the medial joint line, lateral joint line and patellar tendon. Abnormal alignment and abnormal meniscus.  Neurological:     Mental Status: He is alert and oriented to person, place, and time.  Psychiatric:        Behavior: Behavior normal.     Vital signs in last 24 hours: Temp:  [97.9 F (36.6 C)] 97.9 F (36.6 C) (04/26 1011) Pulse Rate:  [86] 86 (04/26 1011) Resp:  [18] 18 (04/26 1011) BP: (167)/(79) 167/79 (04/26 1011) SpO2:  [98 %] 98 % (04/26 1011) Weight:  [108.9 kg] 108.9 kg (04/26 1011)  Labs:   Estimated body mass index is 38.74 kg/m as calculated from the following:   Height as of this encounter: 5\' 6"  (1.676 m).   Weight as of this encounter: 108.9 kg.   Imaging Review Plain radiographs demonstrate severe degenerative joint disease of the left knee(s). The overall alignment ismild varus. The bone quality appears to be good for age and reported activity  level.      Assessment/Plan:  End stage arthritis, left knee   The patient history, physical examination, clinical judgment of the provider and imaging studies are consistent with end stage degenerative joint disease of the left knee(s) and total knee arthroplasty is deemed medically necessary. The treatment options including medical management, injection therapy arthroscopy and arthroplasty were discussed at length. The risks and benefits of total knee arthroplasty were presented and reviewed. The risks due to aseptic loosening, infection, stiffness, patella tracking problems, thromboembolic complications and other imponderables were discussed. The patient acknowledged the explanation, agreed to proceed with the plan and consent was signed. Patient is being admitted for inpatient treatment for surgery, pain control, PT, OT, prophylactic antibiotics, VTE prophylaxis, progressive ambulation and ADL's and  discharge planning. The patient is planning to be discharged home with home health services

## 2020-10-09 NOTE — Anesthesia Procedure Notes (Signed)
Procedure Name: MAC Date/Time: 10/09/2020 1:47 PM Performed by: Dorthea Cove, CRNA Pre-anesthesia Checklist: Patient identified, Emergency Drugs available, Suction available and Patient being monitored Patient Re-evaluated:Patient Re-evaluated prior to induction Oxygen Delivery Method: Simple face mask Preoxygenation: Pre-oxygenation with 100% oxygen Induction Type: IV induction Airway Equipment and Method: Oral airway Placement Confirmation: CO2 detector Tube secured with: Tape Dental Injury: Teeth and Oropharynx as per pre-operative assessment

## 2020-10-10 ENCOUNTER — Encounter (HOSPITAL_COMMUNITY): Payer: Self-pay | Admitting: Orthopaedic Surgery

## 2020-10-10 DIAGNOSIS — I2510 Atherosclerotic heart disease of native coronary artery without angina pectoris: Secondary | ICD-10-CM | POA: Diagnosis not present

## 2020-10-10 DIAGNOSIS — E039 Hypothyroidism, unspecified: Secondary | ICD-10-CM | POA: Diagnosis not present

## 2020-10-10 DIAGNOSIS — Z8616 Personal history of COVID-19: Secondary | ICD-10-CM | POA: Diagnosis not present

## 2020-10-10 DIAGNOSIS — M1712 Unilateral primary osteoarthritis, left knee: Secondary | ICD-10-CM | POA: Diagnosis not present

## 2020-10-10 DIAGNOSIS — I10 Essential (primary) hypertension: Secondary | ICD-10-CM | POA: Diagnosis not present

## 2020-10-10 DIAGNOSIS — Z85828 Personal history of other malignant neoplasm of skin: Secondary | ICD-10-CM | POA: Diagnosis not present

## 2020-10-10 DIAGNOSIS — Z87891 Personal history of nicotine dependence: Secondary | ICD-10-CM | POA: Diagnosis not present

## 2020-10-10 LAB — BASIC METABOLIC PANEL
Anion gap: 6 (ref 5–15)
BUN: 14 mg/dL (ref 8–23)
CO2: 28 mmol/L (ref 22–32)
Calcium: 9 mg/dL (ref 8.9–10.3)
Chloride: 102 mmol/L (ref 98–111)
Creatinine, Ser: 0.99 mg/dL (ref 0.61–1.24)
GFR, Estimated: >60 mL/min (ref >60)
Glucose, Bld: 165 mg/dL — ABNORMAL HIGH (ref 70–99)
Potassium: 4.6 mmol/L (ref 3.5–5.1)
Sodium: 136 mmol/L (ref 135–145)

## 2020-10-10 LAB — CBC
HCT: 40.5 % (ref 39.0–52.0)
Hemoglobin: 13.4 g/dL (ref 13.0–17.0)
MCH: 29.3 pg (ref 26.0–34.0)
MCHC: 33.1 g/dL (ref 30.0–36.0)
MCV: 88.4 fL (ref 80.0–100.0)
Platelets: 203 10*3/uL (ref 150–400)
RBC: 4.58 MIL/uL (ref 4.22–5.81)
RDW: 13.2 % (ref 11.5–15.5)
WBC: 14.7 10*3/uL — ABNORMAL HIGH (ref 4.0–10.5)
nRBC: 0 % (ref 0.0–0.2)

## 2020-10-10 MED ORDER — OXYCODONE HCL 5 MG PO TABS: 5.0000 mg | ORAL_TABLET | ORAL | 0 refills | Status: DC | PRN

## 2020-10-10 MED ORDER — ONDANSETRON 4 MG PO TBDP: 4.0000 mg | ORAL_TABLET | Freq: Three times a day (TID) | ORAL | 0 refills | Status: DC | PRN

## 2020-10-10 MED ORDER — METHOCARBAMOL 500 MG PO TABS: 500.0000 mg | ORAL_TABLET | Freq: Four times a day (QID) | ORAL | 1 refills | Status: DC | PRN

## 2020-10-10 MED ORDER — ASPIRIN 81 MG PO CHEW: 81.0000 mg | CHEWABLE_TABLET | Freq: Two times a day (BID) | ORAL | 0 refills | Status: DC

## 2020-10-10 NOTE — Progress Notes (Signed)
Subjective: 1 Day Post-Op Procedure(s) (LRB): LEFT TOTAL KNEE ARTHROPLASTY (Left) Patient reports pain as moderate.    Objective: Vital signs in last 24 hours: Temp:  [97 F (36.1 C)-98.3 F (36.8 C)] 98.3 F (36.8 C) (04/27 0405) Pulse Rate:  [50-86] 68 (04/27 0405) Resp:  [0-26] 20 (04/27 0405) BP: (108-171)/(58-92) 122/58 (04/27 0405) SpO2:  [90 %-100 %] 94 % (04/27 0405) Weight:  [108.9 kg] 108.9 kg (04/26 1011)  Intake/Output from previous day: 04/26 0701 - 04/27 0700 In: 1590 [P.O.:240; I.V.:1150; IV Piggyback:200] Out: 900 [Urine:850; Blood:50] Intake/Output this shift: No intake/output data recorded.  Recent Labs    10/10/20 0035  HGB 13.4   Recent Labs    10/10/20 0035  WBC 14.7*  RBC 4.58  HCT 40.5  PLT 203   Recent Labs    10/10/20 0035  NA 136  K 4.6  CL 102  CO2 28  BUN 14  CREATININE 0.99  GLUCOSE 165*  CALCIUM 9.0   No results for input(s): LABPT, INR in the last 72 hours.  Sensation intact distally Intact pulses distally Dorsiflexion/Plantar flexion intact Incision: dressing C/D/I No cellulitis present Compartment soft   Assessment/Plan: 1 Day Post-Op Procedure(s) (LRB): LEFT TOTAL KNEE ARTHROPLASTY (Left) Up with therapy Discharge home with home health this afternoon.    Patient's anticipated LOS is less than 2 midnights, meeting these requirements: - Younger than 55 - Lives within 1 hour of care - Has a competent adult at home to recover with post-op recover - NO history of  - Chronic pain requiring opiods  - Diabetes  - Coronary Artery Disease  - Heart failure  - Heart attack  - Stroke  - DVT/VTE  - Cardiac arrhythmia  - Respiratory Failure/COPD  - Renal failure  - Anemia  - Advanced Liver disease       Mcarthur Rossetti 10/10/2020, 7:45 AM

## 2020-10-10 NOTE — Op Note (Signed)
NAME: MANLY, NESTLE MEDICAL RECORD NO: [id number] ACCOUNT NO: [id number] DATE OF BIRTH: [birth year] FACILITY: [facility] LOCATION: [facility] PHYSICIAN: Lind Guest. Ninfa Linden, MD  Operative Report   DATE OF PROCEDURE: 10/09/2020  PREOPERATIVE DIAGNOSIS:  Primary osteoarthritis, degenerative joint disease, left knee.  POSTOPERATIVE DIAGNOSIS:  Primary osteoarthritis, degenerative joint disease, left knee.  PROCEDURE:  Left total knee arthroplasty.  IMPLANTS:  Stryker Triathlon press-fit knee system with size 6 femur, size 5 tibial tray, 12 mm fixed bearing polyethylene insert, size 32 patellar button.  SURGEON:  Lind Guest. Ninfa Linden, M.D.  ASSISTANT:  Erskine Emery, PA-C.  ANESTHESIA:   1.  Left lower extremity adductor canal block. 2.  Spinal.  TOURNIQUET TIME:  1 hour.  ANTIBIOTICS:  2 g IV Ancef.  BLOOD LOSS:  Less than 734 mL  COMPLICATIONS:  None.  INDICATIONS:  The patient is a 65 year old gentleman who has been dealing with worsening bilateral knee arthritis and pain for many years now.  We have seen him for a long period of time.  He has well-documented osteoarthritis in both his knees and it is  quite severe.  At this point, his pain is detrimentally affecting his mobility, his quality of life and his activities of daily living.  He wishes to proceed with a left total knee arthroplasty.  We agree with this as well  having known for a long  period of time and based on his clinical exam and x-rays and the failure of conservative treatment with multiple modalities.  We talked about the risk of acute blood loss anemia, nerve or vessel injury, fracture, infection, DVT, and implant failure.  We  talked about the goals being decrease pain, improve mobility and overall improve quality of life.  DESCRIPTION OF PROCEDURE:  After informed consent was obtained, appropriate left knee was marked.  An adductor canal block was obtained in the left lower extremity in the holding room.  He was  then brought to the operating room and sat up on the  operating table.  Spinal anesthesia was obtained, he was laid in the supine position on the operating table.  Foley catheter was placed and a nonsterile tourniquet was placed around his upper left thigh.  His left thigh, knee, leg, and ankle were prepped  and draped in DuraPrep and sterile drapes including a sterile stockinette.  A timeout was called and he was identified as correct patient, correct left knee.  We then used Esmarch to wrap out the leg and tourniquet was inflated to 300 mmHg pressure.  I  then made a direct midline incision over the patella and carried this proximally and distally.  I dissected down the knee joint, carried out a medial parapatellar arthrotomy, finding a moderate joint effusion and significant periarticular osteophytes  throughout the knee.  There was complete wear of the medial aspect.  I removed periarticular osteophytes from all around the knee and removed remnants of ACL, PCL, medial and lateral meniscus.  With the knee in a flexed position, we used the  extramedullary cutting guide for making our proximal tibia cut, selecting this for taking 9 mm off the high side and correcting for varus and valgus and neutral slope.  We made this cut without difficulty.  We then went to the femur and used the  intramedullary guide for our distal femoral cutting guide.  We set this for a left knee at 5 degrees externally rotated for a 10 mm distal femoral cut.  We made this cut without difficulty, and brought  the knee back down to full extension and actually he  slightly hyperextended with a 9 mm extension block.  We then went back to the femur and put our femoral sizing guide based off the epicondylar axis.  Based off of this, we chose a size 6 femur.  We put a 4-in-1 cutting block for a size 6 femur, made our  anterior and posterior cuts, followed by our chamfer cuts.  We then made our femoral box cut.  Attention was then turned to  the tibia, which was a size 5 tibial tray for coverage on the tibial tubercle setting the rotation off of the femur and the  tibial tubercle and the tibial plateau allowed [place] to choose the size of a size 5.  We did our keel punch off of this for a press-fit knee system.  Of note, we did this on a press-fit knee system based on the good quality of bone he has. With a size 5  tibial tray, we trialed a size 6 left femur and placed our 9 mm fixed bearing polyethylene insert.  At that point, we knew that we would be going up in size, but we were pleased overall with the fit of the implants.  We made our patellar cut and drilled  three holes for a size 32 press-fit patellar button.  We then removed all instrumentation from the knee and irrigated the knee with normal saline solution using pulsatile lavage.  We dried the knee real well and then placed our Stryker press-fit tibial  tray size 5 followed by a real size 6 press-fit left femur.  We placed our real 12 mm fixed bearing polyethylene insert and press-fit our size 32 patellar button.  We put him through several cycles of range of motion.  We were pleased with range of  motion and stability.  We then let the tourniquet down and hemostasis was obtained with electrocautery.  We closed the arthrotomy with interrupted #1 Vicryl suture followed by 0 Vicryl to close the deep tissue and 2-0 Vicryl to close subcutaneous tissue.   The skin was reapproximated with staples.  A Xeroform and well-padded sterile dressing was applied.  He was taken off the operating table and taken to recovery room in stable condition with all final counts being correct.  No complications noted.  Of note, Benita Stabile, PA-C, assisted during the entire case and his assistance was crucial for facilitating every aspect of this case.   PAA D: 10/09/2020 3:05:47 pm T: 10/10/2020 4:09:00 am  JOB: [id number]/ [id number]

## 2020-10-10 NOTE — Discharge Instructions (Signed)

## 2020-10-10 NOTE — Progress Notes (Signed)
Physical Therapy Treatment Patient Details Name: Samuel [name] MRN: [id number] DOB: May 27, 1956 Today's Date: 10/10/2020    History of Present Illness Pt adm 4/26 for lt tkr. PMH - arthritis, cad, htn    PT Comments    Pt seen for second session, pt pre-medicated for better tolerance. Pt reporting diaphoresis, dizziness after short-distance gait in room, with BP and HR 96/54 and 83 bpm respectively, requiring return to supine. Pt with recovery of BP and HR to 118/61 and 67 bpm. Pt with increased knee pain this session, and tolerated very little TKR exercise. PT recommending pt stay for additional night given mobility deficits and hypotension with mobility, RN notified.     Follow Up Recommendations  Follow surgeon's recommendation for DC plan and follow-up therapies;Supervision for mobility/OOB     Equipment Recommendations  3in1 (PT);Rolling walker with 5" wheels    Recommendations for Other Services       Precautions / Restrictions Precautions Precautions: Fall;Knee Restrictions Weight Bearing Restrictions: No    Mobility  Bed Mobility Overal bed mobility: Needs Assistance Bed Mobility: Sit to Supine;Supine to Sit     Supine to sit: Min assist Sit to supine: Mod assist;HOB elevated   General bed mobility comments: min-mod assist for LE management, pt able to position self in bed with increased time and HOB flat.    Transfers Overall transfer level: Needs assistance Equipment used: Rolling walker (2 wheeled) Transfers: Sit to/from Stand Sit to Stand: Min assist;From elevated surface         General transfer comment: min assist for power up, rise, and steadying. VC for hand placement when standing/sitting.  Ambulation/Gait Ambulation/Gait assistance: Min assist Gait Distance (Feet): 15 Feet Assistive device: Rolling walker (2 wheeled) Gait Pattern/deviations: Step-to pattern;Decreased stance time - left;Decreased step length - left;Trunk flexed;Antalgic Gait  velocity: decr   General Gait Details: min assist to steady, encourage foot flat but pt barely WB through LLE due to pain. Pt reporting diaphoresis, dizziness after short-distance gait, BP and HR 96/54 and 83 bpm respectively, requiring return to supine   Stairs             Wheelchair Mobility    Modified Rankin (Stroke Patients Only)       Balance Overall balance assessment: Needs assistance Sitting-balance support: No upper extremity supported;Feet supported Sitting balance-Leahy Scale: Fair     Standing balance support: Bilateral upper extremity supported Standing balance-Leahy Scale: Poor Standing balance comment: heavy reliance on RW                            Cognition Arousal/Alertness: Awake/alert Behavior During Therapy: WFL for tasks assessed/performed Overall Cognitive Status: Within Functional Limits for tasks assessed                                        Exercises Total Joint Exercises Ankle Circles/Pumps: AROM;Left;10 reps;Supine Quad Sets: AAROM;Left;Supine (tolerated x2) Heel Slides: AAROM;Left;10 reps;Supine Hip ABduction/ADduction: AAROM;Left;5 reps;Supine Knee Flexion: AAROM;Left;10 reps;Seated Goniometric ROM: knee aarom ~15-45*, severely limited by pain this session    General Comments        Pertinent Vitals/Pain Pain Assessment: Faces Faces Pain Scale: Hurts even more Pain Location: lt knee Pain Descriptors / Indicators: Grimacing;Guarding;Aching;Moaning Pain Intervention(s): Limited activity within patient's tolerance;Monitored during session;Repositioned;Ice applied    Home Living  Prior Function            PT Goals (current goals can now be found in the care plan section) Acute Rehab PT Goals Patient Stated Goal: Decr pain and nausea PT Goal Formulation: With patient Time For Goal Achievement: 10/12/20 Potential to Achieve Goals: Good Progress towards PT goals:  Progressing toward goals    Frequency    7X/week      PT Plan Current plan remains appropriate    Co-evaluation              AM-PAC PT "6 Clicks" Mobility   Outcome Measure  Help needed turning from your back to your side while in a flat bed without using bedrails?: A Little Help needed moving from lying on your back to sitting on the side of a flat bed without using bedrails?: A Little Help needed moving to and from a bed to a chair (including a wheelchair)?: A Little Help needed standing up from a chair using your arms (e.g., wheelchair or bedside chair)?: A Little Help needed to walk in hospital room?: A Little Help needed climbing 3-5 steps with a railing? : A Lot 6 Click Score: 17    End of Session Equipment Utilized During Treatment: Gait belt Activity Tolerance: Other (comment);Patient limited by pain (orthostasis) Patient left: in bed;with call bell/phone within reach;with family/visitor present Nurse Communication: Mobility status;Other (comment) (nausea) PT Visit Diagnosis: Other abnormalities of gait and mobility (R26.89);Pain Pain - Right/Left: Left Pain - part of body: Knee     Time: 9323-5573 PT Time Calculation (min) (ACUTE ONLY): 21 min  Charges:  $Gait Training: 8-22 mins $Therapeutic Exercise: 8-22 mins                     Stacie Glaze, PT DPT Acute Rehabilitation Services Pager [phone]  Office [phone]  Roxine Caddy E Ruffin Pyo 10/10/2020, 2:49 PM

## 2020-10-10 NOTE — Plan of Care (Signed)
  Problem: Safety: Goal: Ability to remain free from injury will improve Outcome: Progressing   Problem: Education: Goal: Knowledge of the prescribed therapeutic regimen will improve Outcome: Progressing Goal: Individualized Educational Video(s) Outcome: Progressing   Problem: Activity: Goal: Ability to avoid complications of mobility impairment will improve Outcome: Progressing Goal: Range of joint motion will improve Outcome: Progressing   Problem: Clinical Measurements: Goal: Postoperative complications will be avoided or minimized Outcome: Progressing   Problem: Pain Management: Goal: Pain level will decrease with appropriate interventions Outcome: Progressing   Problem: Skin Integrity: Goal: Will show signs of wound healing Outcome: Progressing

## 2020-10-10 NOTE — Progress Notes (Signed)
Physical Therapy Treatment Patient Details Name: Samuel [name] MRN: [id number] DOB: 05-02-1956 Today's Date: 10/10/2020    History of Present Illness Pt adm 4/26 for lt tkr. PMH - arthritis, cad, htn    PT Comments    Pt reporting severe L knee pain and intermittent nausea, but agreeable to OOB mobility with encouragement. Pt overall requiring min assist for bed mobility, transfers, and short distance gait this am, very limited by pain and nausea. Pt unable to reach foot flat with LLE even with cuing and use of RW. PT to see pt for second session to continue gait training, TKA exercises.     Follow Up Recommendations  Follow surgeon's recommendation for DC plan and follow-up therapies;Supervision for mobility/OOB     Equipment Recommendations  3in1 (PT);Rolling walker with 5" wheels    Recommendations for Other Services       Precautions / Restrictions Precautions Precautions: Fall;Knee Restrictions Weight Bearing Restrictions: No    Mobility  Bed Mobility Overal bed mobility: Needs Assistance Bed Mobility: Sit to Supine       Sit to supine: Mod assist;HOB elevated   General bed mobility comments: sitting EOB with assist of wife and sister-in-law upon PT arrival to room. Mod assist for return to supine with LE lifting into bed, repositioning.    Transfers Overall transfer level: Needs assistance Equipment used: Rolling walker (2 wheeled) Transfers: Sit to/from Stand Sit to Stand: Min assist;From elevated surface         General transfer comment: min assist for power up, rise, and steadying. VC for hand placement when standing/sitting.  Ambulation/Gait Ambulation/Gait assistance: Min assist Gait Distance (Feet): 25 Feet Assistive device: Rolling walker (2 wheeled) Gait Pattern/deviations: Step-to pattern;Decreased stance time - left;Decreased step length - left;Trunk flexed;Antalgic Gait velocity: decr   General Gait Details: Min assist to steady, mod cuing  for reaching foot flat with LLE in stance phase but pt doing toe-touch WB secondary to pain. Pt complaining of nausea throughout mobility.   Stairs             Wheelchair Mobility    Modified Rankin (Stroke Patients Only)       Balance Overall balance assessment: Needs assistance Sitting-balance support: No upper extremity supported;Feet supported Sitting balance-Leahy Scale: Fair     Standing balance support: Bilateral upper extremity supported Standing balance-Leahy Scale: Poor Standing balance comment: heavy reliance on RW                            Cognition Arousal/Alertness: Awake/alert Behavior During Therapy: WFL for tasks assessed/performed Overall Cognitive Status: Within Functional Limits for tasks assessed                                        Exercises Total Joint Exercises Ankle Circles/Pumps: AROM;Left;10 reps;Supine Quad Sets: Supine;AAROM;Left;10 reps Hip ABduction/ADduction: AAROM;Left;5 reps;Supine Knee Flexion: AAROM;Left;10 reps;Seated Goniometric ROM: knee aarom ~10-75* limited by pain and stiffness    General Comments        Pertinent Vitals/Pain Pain Assessment: Faces Faces Pain Scale: Hurts even more Pain Location: lt knee Pain Descriptors / Indicators: Grimacing;Guarding;Aching Pain Intervention(s): Limited activity within patient's tolerance;Monitored during session;Repositioned    Home Living                      Prior Function  PT Goals (current goals can now be found in the care plan section) Acute Rehab PT Goals Patient Stated Goal: Decr pain and nausea PT Goal Formulation: With patient Time For Goal Achievement: 10/12/20 Potential to Achieve Goals: Good Progress towards PT goals: Progressing toward goals    Frequency    7X/week      PT Plan Current plan remains appropriate    Co-evaluation              AM-PAC PT "6 Clicks" Mobility   Outcome Measure   Help needed turning from your back to your side while in a flat bed without using bedrails?: None Help needed moving from lying on your back to sitting on the side of a flat bed without using bedrails?: A Little Help needed moving to and from a bed to a chair (including a wheelchair)?: A Little Help needed standing up from a chair using your arms (e.g., wheelchair or bedside chair)?: A Little Help needed to walk in hospital room?: A Little Help needed climbing 3-5 steps with a railing? : A Lot 6 Click Score: 18    End of Session Equipment Utilized During Treatment: Gait belt Activity Tolerance: Other (comment);Patient limited by pain (Limited by nausea) Patient left: in bed;with call bell/phone within reach;with family/visitor present Nurse Communication: Mobility status;Other (comment) (nausea) PT Visit Diagnosis: Other abnormalities of gait and mobility (R26.89);Pain Pain - Right/Left: Left Pain - part of body: Knee     Time: 1020-1048 PT Time Calculation (min) (ACUTE ONLY): 28 min  Charges:  $Gait Training: 8-22 mins $Therapeutic Exercise: 8-22 mins                     Stacie Glaze, PT DPT Acute Rehabilitation Services Pager [phone]  Office [phone]   [place] 10/10/2020, 12:54 PM

## 2020-10-10 NOTE — Progress Notes (Signed)
Patient has not tolerated pain well. He wasn't able to progress with PT. Continue to need assistance ambulating. Had an hypotensive episode while ambulating with PT.   MD was notified by PT of concerns with discharging home. Patient's discharge is discontinued and has a bed on 5N in room 8.   Report was called to Sam, receiving RN. Patient was transported to 5N08 without any distress or issues. All belongings were transported with the patient. His wife was at the bedside at the time of transferring. She packed his belongings to ensure nothing was left behind.   No additional patient's needs assessed.

## 2020-10-10 NOTE — Telephone Encounter (Signed)
Called pt and relayed Dr. Clovis Riley message.  Pt verbalizes understanding and states that he will call us closer to 6 weeks from now to schedule an appt.

## 2020-10-10 NOTE — Telephone Encounter (Signed)
Sent Dr. Clovis Riley response to patient via [place].

## 2020-10-10 NOTE — Discharge Summary (Signed)
Patient ID: Samuel [name] MRN: [id number] DOB/AGE: 02/16/1956 65 y.o.  Admit date: 10/09/2020 Discharge date: 10/10/2020  Admission Diagnoses:  Principal Problem:   Unilateral primary osteoarthritis, left knee Active Problems:   Status post left knee replacement   Discharge Diagnoses:  Same  Past Medical History:  Diagnosis Date  . Anxiety   . Arthritis   . Basal cell carcinoma 04/29/2018   upper mid back, right mid back   . Basal cell carcinoma 04/29/2018   right mid back   . Basal cell carcinoma 06/10/2018   left lover back   . Coronary artery disease    Mild-moderate non-obstructive CAD 06/15/17 (20% dLM, 50% mLAD, 30% OM3, 40% mRCA, 40% dRCA)  . COVID-19 unknown  . Depression   . Former heavy cigarette smoker (20-39 per day) 03/05/2018   Quit 2000; 60 pak year history  . HTN (hypertension)     Surgeries: Procedure(s): LEFT TOTAL KNEE ARTHROPLASTY on 10/09/2020   Consultants:   Discharged Condition: Improved  Hospital Course: Samuel [name] is an 66 y.o. male who was admitted 10/09/2020 for operative treatment ofUnilateral primary osteoarthritis, left knee. Patient has severe unremitting pain that affects sleep, daily activities, and work/hobbies. After pre-op clearance the patient was taken to the operating room on 10/09/2020 and underwent  Procedure(s): LEFT TOTAL KNEE ARTHROPLASTY.    Patient was given perioperative antibiotics:  Anti-infectives (From admission, onward)   Start     Dose/Rate Route Frequency Ordered Stop   10/09/20 2000  ceFAZolin (ANCEF) 1 g in sodium chloride 0.9 % 100 mL IVPB        1 g 200 mL/hr over 30 Minutes Intravenous Every 6 hours 10/09/20 1716 10/10/20 0251   10/09/20 1000  ceFAZolin (ANCEF) IVPB 2g/100 mL premix        2 g 200 mL/hr over 30 Minutes Intravenous On call to O.R. 10/09/20 0950 10/09/20 1357       Patient was given sequential compression devices, early ambulation, and chemoprophylaxis to prevent DVT.  Patient benefited  maximally from hospital stay and there were no complications.    Recent vital signs:  Patient Vitals for the past 24 hrs:  BP Temp Temp src Pulse Resp SpO2 Height Weight  10/10/20 0405 (!) 122/58 98.3 F (36.8 C) Oral 68 20 94 % -- --  10/09/20 2305 121/70 98.1 F (36.7 C) Oral 77 20 98 % -- --  10/09/20 1937 108/61 98.3 F (36.8 C) Oral (!) 50 20 90 % -- --  10/09/20 1729 131/69 (!) 97.5 F (36.4 C) Oral 67 20 99 % -- --  10/09/20 1646 132/72 -- -- 72 16 -- -- --  10/09/20 1616 133/73 (!) 97.1 F (36.2 C) -- 66 (!) 22 100 % -- --  10/09/20 1601 126/80 (!) 97 F (36.1 C) -- 70 (!) 26 99 % -- --  10/09/20 1546 117/77 -- -- 70 16 100 % -- --  10/09/20 1531 120/75 (!) 97 F (36.1 C) -- 66 17 100 % -- --  10/09/20 1130 129/76 -- -- 71 -- 97 % -- --  10/09/20 1125 135/68 -- -- 70 16 99 % -- --  10/09/20 1120 (!) 171/92 -- -- 77 (!) 0 100 % -- --  10/09/20 1011 (!) 167/79 97.9 F (36.6 C) Oral 86 18 98 % 5\' 6"  (1.676 m) 108.9 kg     Recent laboratory studies:  Recent Labs    10/10/20 0035  WBC 14.7*  HGB 13.4  HCT 40.5  PLT 203  NA 136  K 4.6  CL 102  CO2 28  BUN 14  CREATININE 0.99  GLUCOSE 165*  CALCIUM 9.0     Discharge Medications:   Allergies as of 10/10/2020   No Known Allergies     Medication List    TAKE these medications   acetaminophen 325 MG tablet Commonly known as: TYLENOL Take 650 mg by mouth every 6 (six) hours as needed.   amLODipine 5 MG tablet Commonly known as: NORVASC TAKE 1 TABLET EVERY DAY   aspirin 81 MG chewable tablet Chew 1 tablet (81 mg total) by mouth 2 (two) times daily.   FLUoxetine 20 MG capsule Commonly known as: PROZAC Take 1 capsule (20 mg total) by mouth daily.   methocarbamol 500 MG tablet Commonly known as: ROBAXIN Take 1 tablet (500 mg total) by mouth every 6 (six) hours as needed for muscle spasms.   Naproxen Sodium 220 MG Caps Take 440 mg by mouth in the morning.   oxyCODONE 5 MG immediate release  tablet Commonly known as: Oxy IR/ROXICODONE Take 1-2 tablets (5-10 mg total) by mouth every 4 (four) hours as needed for moderate pain (pain score 4-6).   vitamin B-12 1000 MCG tablet Commonly known as: CYANOCOBALAMIN Take 1,000 mcg by mouth daily.            Durable Medical Equipment  (From admission, onward)         Start     Ordered   10/09/20 1717  DME 3 n 1  Once        10/09/20 1716   10/09/20 1717  DME Walker rolling  Once       Question Answer Comment  Walker: With 5 Inch Wheels   Patient needs a walker to treat with the following condition Status post left knee replacement      10/09/20 1716          Diagnostic Studies: DG Knee Left Port  Result Date: 10/09/2020 CLINICAL DATA:  Postop from left knee arthroplasty. EXAM: PORTABLE LEFT KNEE - 1-2 VIEW COMPARISON:  None. FINDINGS: Left knee arthroplasty is seen with all 3 components in expected position. No evidence of fracture or dislocation. Overlying skin staples noted. IMPRESSION: Expected postop appearance of left knee arthroplasty. Electronically Signed   By: Marlaine Hind M.D.   On: 10/09/2020 16:04    Disposition: Discharge disposition: 01-Home or Self Care          Follow-up Information    Mcarthur Rossetti, MD. Go on 10/23/2020.   Specialty: Orthopedic Surgery Why: at 9:30 am for your 2 week post-op appointment with Dr. Clarita Leber information: [place] [address] [phone]                Signed: Mcarthur Rossetti 10/10/2020, 7:46 AM

## 2020-10-11 DIAGNOSIS — I2510 Atherosclerotic heart disease of native coronary artery without angina pectoris: Secondary | ICD-10-CM | POA: Diagnosis not present

## 2020-10-11 DIAGNOSIS — Z85828 Personal history of other malignant neoplasm of skin: Secondary | ICD-10-CM | POA: Diagnosis not present

## 2020-10-11 DIAGNOSIS — E039 Hypothyroidism, unspecified: Secondary | ICD-10-CM | POA: Diagnosis not present

## 2020-10-11 DIAGNOSIS — I10 Essential (primary) hypertension: Secondary | ICD-10-CM | POA: Diagnosis not present

## 2020-10-11 DIAGNOSIS — Z8616 Personal history of COVID-19: Secondary | ICD-10-CM | POA: Diagnosis not present

## 2020-10-11 DIAGNOSIS — M1712 Unilateral primary osteoarthritis, left knee: Secondary | ICD-10-CM | POA: Diagnosis not present

## 2020-10-11 DIAGNOSIS — Z87891 Personal history of nicotine dependence: Secondary | ICD-10-CM | POA: Diagnosis not present

## 2020-10-11 MED ORDER — BUPIVACAINE IN DEXTROSE 0.75-8.25 % IT SOLN
INTRATHECAL | Status: DC | PRN
  Administered 2020-10-09: 2 mL via INTRATHECAL

## 2020-10-11 NOTE — TOC Transition Note (Signed)
Transition of Care [facility]) - CM/SW Discharge Note   Patient Details  Name: Samuel [name] MRN: [id number] Date of Birth: [birth year]  [facility] CM/SW Contact:  Sharin Mons, RN Phone Number: 10/11/2020, 4:58 PM   Clinical Narrative:    Patient will DC to: Home Anticipated DC date: 10/11/2020 Family notified: yes Transport by:car           Status post left knee replacement  Per MD patient ready for DC today. RN, patient, patient's family, and [place] notified of DC. Per pt DME: rolling walker and 3 in1/BSC already @ bedside delivered by [employer],.  Pt without Rx med concerns. Post hospital f/u noted on AVS.   Wife to provide transportation to home.  RNCM will sign off for now as intervention is no longer needed. Please consult [place] again if new needs arise.   Final next level of care: [place]     Patient Goals and CMS Choice     Choice offered to / list presented to : Patient  Discharge Placement                       Discharge Plan and Services                DME Arranged: 3-N-1,Walker rolling DME Agency: [employer]       HH Arranged: PT [place] Agency: [facility]        Social Determinants of Health (SDOH) Interventions     Readmission Risk Interventions No flowsheet data found.

## 2020-10-11 NOTE — Discharge Summary (Signed)
Patient ID: Samuel [name] MRN: [id number] DOB/AGE: 1955/09/23 65 y.o.  Admit date: 10/09/2020 Discharge date: 10/11/2020  Admission Diagnoses:  Principal Problem:   Unilateral primary osteoarthritis, left knee Active Problems:   Status post left knee replacement   Discharge Diagnoses:  Same  Past Medical History:  Diagnosis Date  . Anxiety   . Arthritis   . Basal cell carcinoma 04/29/2018   upper mid back, right mid back   . Basal cell carcinoma 04/29/2018   right mid back   . Basal cell carcinoma 06/10/2018   left lover back   . Coronary artery disease    Mild-moderate non-obstructive CAD 06/15/17 (20% dLM, 50% mLAD, 30% OM3, 40% mRCA, 40% dRCA)  . COVID-19 unknown  . Depression   . Former heavy cigarette smoker (20-39 per day) 03/05/2018   Quit 2000; 60 pak year history  . HTN (hypertension)     Surgeries: Procedure(s): LEFT TOTAL KNEE ARTHROPLASTY on 10/09/2020   Consultants:   Discharged Condition: Improved  Hospital Course: TAYLEN OSORTO is an 65 y.o. male who was admitted 10/09/2020 for operative treatment ofUnilateral primary osteoarthritis, left knee. Patient has severe unremitting pain that affects sleep, daily activities, and work/hobbies. After pre-op clearance the patient was taken to the operating room on 10/09/2020 and underwent  Procedure(s): LEFT TOTAL KNEE ARTHROPLASTY.    Patient was given perioperative antibiotics:  Anti-infectives (From admission, onward)   Start     Dose/Rate Route Frequency Ordered Stop   10/09/20 2000  ceFAZolin (ANCEF) 1 g in sodium chloride 0.9 % 100 mL IVPB        1 g 200 mL/hr over 30 Minutes Intravenous Every 6 hours 10/09/20 1716 10/10/20 0251   10/09/20 1000  ceFAZolin (ANCEF) IVPB 2g/100 mL premix        2 g 200 mL/hr over 30 Minutes Intravenous On call to O.R. 10/09/20 0950 10/09/20 1357       Patient was given sequential compression devices, early ambulation, and chemoprophylaxis to prevent DVT.  Patient benefited  maximally from hospital stay and there were no complications.    Recent vital signs:  Patient Vitals for the past 24 hrs:  BP Temp Temp src Pulse Resp SpO2  10/11/20 0558 117/62 98.9 F (37.2 C) Oral 74 18 94 %  10/10/20 2207 (!) 116/57 99.5 F (37.5 C) Oral 79 16 94 %  10/10/20 1550 118/62 99.7 F (37.6 C) Oral 76 19 99 %  10/10/20 1230 116/62 98.1 F (36.7 C) Oral 71 18 98 %     Recent laboratory studies:  Recent Labs    10/10/20 0035  WBC 14.7*  HGB 13.4  HCT 40.5  PLT 203  NA 136  K 4.6  CL 102  CO2 28  BUN 14  CREATININE 0.99  GLUCOSE 165*  CALCIUM 9.0     Discharge Medications:   Allergies as of 10/11/2020   No Known Allergies     Medication List    TAKE these medications   acetaminophen 325 MG tablet Commonly known as: TYLENOL Take 650 mg by mouth every 6 (six) hours as needed.   amLODipine 5 MG tablet Commonly known as: NORVASC TAKE 1 TABLET EVERY DAY   aspirin 81 MG chewable tablet Chew 1 tablet (81 mg total) by mouth 2 (two) times daily.   FLUoxetine 20 MG capsule Commonly known as: PROZAC Take 1 capsule (20 mg total) by mouth daily.   methocarbamol 500 MG tablet Commonly known as: ROBAXIN Take 1 tablet (  500 mg total) by mouth every 6 (six) hours as needed for muscle spasms.   Naproxen Sodium 220 MG Caps Take 440 mg by mouth in the morning.   ondansetron 4 MG disintegrating tablet Commonly known as: Zofran ODT Take 1 tablet (4 mg total) by mouth every 8 (eight) hours as needed for nausea or vomiting.   oxyCODONE 5 MG immediate release tablet Commonly known as: Oxy IR/ROXICODONE Take 1-2 tablets (5-10 mg total) by mouth every 4 (four) hours as needed for moderate pain (pain score 4-6).   vitamin B-12 1000 MCG tablet Commonly known as: CYANOCOBALAMIN Take 1,000 mcg by mouth daily.            Durable Medical Equipment  (From admission, onward)         Start     Ordered   10/09/20 1717  DME 3 n 1  Once        10/09/20 1716    10/09/20 1717  DME Walker rolling  Once       Question Answer Comment  Walker: With 5 Inch Wheels   Patient needs a walker to treat with the following condition Status post left knee replacement      10/09/20 1716          Diagnostic Studies: DG Knee Left Port  Result Date: 10/09/2020 CLINICAL DATA:  Postop from left knee arthroplasty. EXAM: PORTABLE LEFT KNEE - 1-2 VIEW COMPARISON:  None. FINDINGS: Left knee arthroplasty is seen with all 3 components in expected position. No evidence of fracture or dislocation. Overlying skin staples noted. IMPRESSION: Expected postop appearance of left knee arthroplasty. Electronically Signed   By: Marlaine Hind M.D.   On: 10/09/2020 16:04    Disposition: Discharge disposition: 01-Home or Self Care          Follow-up Information    Mcarthur Rossetti, MD. Go on 10/23/2020.   Specialty: Orthopedic Surgery Why: at 9:30 am for your 2 week post-op appointment with Dr. Clarita Leber information: [place] [address] [phone]                Signed: Erskine Emery 10/11/2020, 8:25 AM

## 2020-10-11 NOTE — Progress Notes (Signed)
Subjective: 2 Days Post-Op Procedure(s) (LRB): LEFT TOTAL KNEE ARTHROPLASTY (Left) Patient reports pain as moderate.  Tolerating diet well. No complaints.   Objective: Vital signs in last 24 hours: Temp:  [98.1 F (36.7 C)-99.7 F (37.6 C)] 98.9 F (37.2 C) (04/28 0558) Pulse Rate:  [71-79] 74 (04/28 0558) Resp:  [16-19] 18 (04/28 0558) BP: (116-118)/(57-62) 117/62 (04/28 0558) SpO2:  [94 %-99 %] 94 % (04/28 0558)  Intake/Output from previous day: No intake/output data recorded. Intake/Output this shift: No intake/output data recorded.  Recent Labs    10/10/20 0035  HGB 13.4   Recent Labs    10/10/20 0035  WBC 14.7*  RBC 4.58  HCT 40.5  PLT 203   Recent Labs    10/10/20 0035  NA 136  K 4.6  CL 102  CO2 28  BUN 14  CREATININE 0.99  GLUCOSE 165*  CALCIUM 9.0   No results for input(s): LABPT, INR in the last 72 hours.  Sensation intact distally Dorsiflexion/Plantar flexion intact Incision: scant drainage Compartment soft   Assessment/Plan: 2 Days Post-Op Procedure(s) (LRB): LEFT TOTAL KNEE ARTHROPLASTY (Left) Up with therapy  If patient does well with PT and remains stable will discharge to home later today.       Auset Fritzler PA-C  10/11/2020, 8:15 AM

## 2020-10-11 NOTE — Anesthesia Procedure Notes (Signed)
Spinal  Patient location during procedure: OR Reason for block: surgical anesthesia Staffing Performed: anesthesiologist  Anesthesiologist: Taggert Bozzi E, MD Preanesthetic Checklist Completed: patient identified, IV checked, risks and benefits discussed, surgical consent, monitors and equipment checked, pre-op evaluation and timeout performed Spinal Block Patient position: sitting Prep: DuraPrep and site prepped and draped Patient monitoring: continuous pulse ox, blood pressure and heart rate Approach: midline Location: L3-4 Injection technique: single-shot Needle Needle type: Pencan  Needle gauge: 24 G Needle length: 9 cm Assessment Events: CSF return Additional Notes Functioning IV was confirmed and monitors were applied. Sterile prep and drape, including hand hygiene and sterile gloves were used. The patient was positioned and the spine was prepped. The skin was anesthetized with lidocaine.  Free flow of clear CSF was obtained prior to injecting local anesthetic into the CSF. The needle was carefully withdrawn. The patient tolerated the procedure well.     

## 2020-10-11 NOTE — Addendum Note (Signed)
Addendum  created 10/11/20 1409 by Lidia Collum, MD   Child order released for a procedure order, Clinical Note Signed, Intraprocedure Blocks edited, Intraprocedure Meds edited

## 2020-10-11 NOTE — Progress Notes (Signed)
Physical Therapy Treatment Patient Details Name: Samuel [name] MRN: [id number] DOB: 04-15-1956 Today's Date: 10/11/2020    History of Present Illness Pt adm 4/26 for lt tkr. PMH - arthritis, cad, htn    PT Comments    Pt received supine in bed with knees bent. SPTA educated on importance of knee precautions. Pt continues to have decreased knee extension and flexion (15-55 degrees of flexion). He verbalizes understanding of his HEP and continues to progress towards goals. Focus of this session was completing and educating on HEP. Will return for second session this PM to focus on gait and stair training. Needs up to minA for bed mobility and transfers. Pt would benefit from further PT to increase ROM and functional independence. Continue to follow surgeon's recommendation at d/c.   Follow Up Recommendations  Follow surgeon's recommendation for DC plan and follow-up therapies;Supervision for mobility/OOB     Equipment Recommendations  3in1 (PT);Rolling walker with 5" wheels    Recommendations for Other Services       Precautions / Restrictions Precautions Precautions: Fall;Knee Restrictions Weight Bearing Restrictions: Yes LLE Weight Bearing: Weight bearing as tolerated    Mobility  Bed Mobility Overal bed mobility: Needs Assistance Bed Mobility: Supine to Sit     Supine to sit: Min assist     General bed mobility comments: Min assist to get LLE to EOB and cues for hand placement to make bed mobility easier. Requiring increased time.    Transfers Overall transfer level: Needs assistance Equipment used: Rolling walker (2 wheeled) Transfers: Sit to/from Stand Sit to Stand: Min assist         General transfer comment: Min A to power into standing and VC's for hand placement.  Ambulation/Gait Ambulation/Gait assistance: Min guard Gait Distance (Feet): 6 Feet Assistive device: Rolling walker (2 wheeled) Gait Pattern/deviations: Step-to pattern;Decreased stance time -  left;Decreased step length - left;Trunk flexed     General Gait Details: min g for safety; cues for pushing through arms and tightening quad when loading LLE. Pt gait trained 6 ft to chair; Limited by pain this session.   Stairs             Wheelchair Mobility    Modified Rankin (Stroke Patients Only)       Balance   Sitting-balance support: No upper extremity supported;Feet supported Sitting balance-Leahy Scale: Fair     Standing balance support: Bilateral upper extremity supported Standing balance-Leahy Scale: Poor Standing balance comment: Pt continues to rely heavily on RW.                            Cognition Arousal/Alertness: Awake/alert Behavior During Therapy: WFL for tasks assessed/performed Overall Cognitive Status: Within Functional Limits for tasks assessed                                        Exercises Total Joint Exercises Quad Sets: AROM;Left;Supine;10 reps Short Arc QuadSinclair Ship;Left;10 reps;Supine Heel Slides: AROM;10 reps;Left;Supine Hip ABduction/ADduction: AROM;10 reps;Left;Supine Straight Leg Raises: AAROM;10 reps;Supine;Left (extensor lag due to lacking general resting knee extension) Goniometric ROM: 15-55 degrees L Knee AROM    General Comments General comments (skin integrity, edema, etc.): Pt complained of calf pain prior to ambulating. SPTA performed Homan's sign to test for DVT with no reproduction of symptoms. Pt stated today that he would "just have wife help him  move his leg OOB" but upon further discussion SPTA learned his wife has scoliosis and is not able to assist as much as he might need; Pt asked for benadryl this session for "itchy nose"; Nurse notified.      Pertinent Vitals/Pain Pain Assessment: 0-10 Pain Score: 4  Pain Location: L Knee Pain Descriptors / Indicators: Grimacing;Discomfort;Throbbing;Guarding;Moaning (Throbbing came with activity.) Pain Intervention(s): Limited activity within  patient's tolerance;Monitored during session;Repositioned    Home Living                      Prior Function            PT Goals (current goals can now be found in the care plan section) Acute Rehab PT Goals Patient Stated Goal: None stated. Potential to Achieve Goals: Good Progress towards PT goals: Progressing toward goals    Frequency    7X/week      PT Plan Current plan remains appropriate    Co-evaluation              AM-PAC PT "6 Clicks" Mobility   Outcome Measure  Help needed turning from your back to your side while in a flat bed without using bedrails?: A Little Help needed moving from lying on your back to sitting on the side of a flat bed without using bedrails?: A Little   Help needed standing up from a chair using your arms (e.g., wheelchair or bedside chair)?: A Little Help needed to walk in hospital room?: A Little Help needed climbing 3-5 steps with a railing? : A Lot 6 Click Score: 14    End of Session Equipment Utilized During Treatment: Gait belt Activity Tolerance: Patient limited by pain Patient left: in chair;with call bell/phone within reach;with chair alarm set Nurse Communication: Mobility status;Other (comment) (Nurse notified of pt requesting benedry for itchy nose.) PT Visit Diagnosis: Other abnormalities of gait and mobility (R26.89);Pain Pain - Right/Left: Left Pain - part of body: Knee     Time: 5397-6734 PT Time Calculation (min) (ACUTE ONLY): 41 min  Charges:  $Therapeutic Exercise: 23-37 mins $Therapeutic Activity: 8-22 mins                      Sandria Manly, Daymon Larsen 10/11/2020, 2:07 PM

## 2020-10-11 NOTE — Progress Notes (Signed)
IV removal well tolerated, discharge instructions reviewed with patient and family, discussed pain medicine and advised not to take too many and that they are addictive. Patient states understanding.  Patient states that he has all of his belongings.

## 2020-10-11 NOTE — Progress Notes (Signed)
Physical Therapy Treatment Patient Details Name: Samuel [name] MRN: [id number] DOB: 30-Dec-1955 Today's Date: 10/11/2020    History of Present Illness Pt adm 4/26 for lt tkr. PMH - arthritis, cad, htn    PT Comments    Pt was in recliner upon arrival with ankle propped and ice on knee. His wife and sister in law were present. Pt had improved activity tolerance this session compared to AM session. Pt received pain meds prior to session. He progressed to stair training and increased gait distance over household distances. SPTA discussed safe car transfers as well as how to assist with stairs once home. He is safe to d/c from a PT standpoint.   Follow Up Recommendations  Follow surgeon's recommendation for DC plan and follow-up therapies;Supervision for mobility/OOB     Equipment Recommendations  3in1 (PT);Rolling walker with 5" wheels    Recommendations for Other Services       Precautions / Restrictions Precautions Precautions: Fall;Knee Restrictions Weight Bearing Restrictions: Yes LLE Weight Bearing: Weight bearing as tolerated    Mobility  Bed Mobility Overal bed mobility: Needs Assistance Bed Mobility: Supine to Sit     Supine to sit: Min assist     General bed mobility comments: Pt sitting in chair    Transfers Overall transfer level: Needs assistance Equipment used: Rolling walker (2 wheeled) Transfers: Sit to/from Stand Sit to Stand: Min assist         General transfer comment: Heavy use of arms, but able to stand from chair on his own. Pt demonstrates good hand placement this session.  Ambulation/Gait Ambulation/Gait assistance: Min guard Gait Distance (Feet): 150 Feet Assistive device: Rolling walker (2 wheeled) Gait Pattern/deviations: Step-to pattern;Trunk flexed     General Gait Details: Min guard for safety; cues to contract quad when loading LLE and for erect posture. Pt flexes at trunk when bearing weight in LLE.   Stairs Stairs: Yes Stairs  assistance: Min guard;+2 safety/equipment Stair Management: No rails;With walker;Backwards;Step to pattern Number of Stairs: 6 (3:3) General stair comments: Pt.'s wife and sister in law present. Wife participated in one trial of stairs with pt to demonstrate understanding for safety. Decreased clearance of R LE.   Wheelchair Mobility    Modified Rankin (Stroke Patients Only)       Balance Overall balance assessment: Needs assistance Sitting-balance support: No upper extremity supported;Feet supported Sitting balance-Leahy Scale: Good     Standing balance support: Bilateral upper extremity supported Standing balance-Leahy Scale: Poor Standing balance comment: Pt continues to rely heavily on RW.                            Cognition Arousal/Alertness: Awake/alert Behavior During Therapy: WFL for tasks assessed/performed Overall Cognitive Status: Within Functional Limits for tasks assessed                                        Exercises Total Joint Exercises Quad Sets: AROM;Left;Supine;10 reps Short Arc QuadSinclair Ship;Left;10 reps;Supine Heel Slides: AROM;10 reps;Left;Supine Hip ABduction/ADduction: AROM;10 reps;Left;Supine Straight Leg Raises: AAROM;10 reps;Supine;Left (extensor lag due to lacking general resting knee extension) Goniometric ROM: 15-55 degrees L Knee AROM    General Comments General comments (skin integrity, edema, etc.): completed stair training with wife and sister in law present. Wife assisted on one trial. Discussed car transfers with pt and family.  Pertinent Vitals/Pain Pain Assessment: No/denies pain Pain Score: 4  Pain Location: L knee discomfort at end of session. Pain Descriptors / Indicators: Grimacing;Discomfort;Throbbing;Guarding;Moaning (Throbbing came with activity.) Pain Intervention(s): Monitored during session    Home Living                      Prior Function            PT Goals (current  goals can now be found in the care plan section) Acute Rehab PT Goals Patient Stated Goal: Go home and get outside. Potential to Achieve Goals: Good Progress towards PT goals: Progressing toward goals    Frequency    7X/week      PT Plan Current plan remains appropriate    Co-evaluation              AM-PAC PT "6 Clicks" Mobility   Outcome Measure  Help needed turning from your back to your side while in a flat bed without using bedrails?: A Little Help needed moving from lying on your back to sitting on the side of a flat bed without using bedrails?: A Little Help needed moving to and from a bed to a chair (including a wheelchair)?: A Little Help needed standing up from a chair using your arms (e.g., wheelchair or bedside chair)?: A Little Help needed to walk in hospital room?: A Little Help needed climbing 3-5 steps with a railing? : A Little 6 Click Score: 18    End of Session Equipment Utilized During Treatment: Gait belt Activity Tolerance: Patient tolerated treatment well Patient left: in chair;with call bell/phone within reach;with family/visitor present;with chair alarm set Nurse Communication: Mobility status PT Visit Diagnosis: Other abnormalities of gait and mobility (R26.89);Pain Pain - Right/Left: Left Pain - part of body: Knee     Time: 0160-1093 PT Time Calculation (min) (ACUTE ONLY): 51 min  Charges:  $Gait Training: 23-37 mins $Therapeutic Activity: 8-22 mins                      Sandria Manly, SPTA    Sandria Manly 10/11/2020, 3:38 PM

## 2020-10-12 ENCOUNTER — Telehealth: Payer: Self-pay | Admitting: *Deleted

## 2020-10-12 DIAGNOSIS — E669 Obesity, unspecified: Secondary | ICD-10-CM | POA: Diagnosis not present

## 2020-10-12 DIAGNOSIS — I83813 Varicose veins of bilateral lower extremities with pain: Secondary | ICD-10-CM | POA: Diagnosis not present

## 2020-10-12 DIAGNOSIS — I2510 Atherosclerotic heart disease of native coronary artery without angina pectoris: Secondary | ICD-10-CM | POA: Diagnosis not present

## 2020-10-12 DIAGNOSIS — I10 Essential (primary) hypertension: Secondary | ICD-10-CM | POA: Diagnosis not present

## 2020-10-12 DIAGNOSIS — F32A Depression, unspecified: Secondary | ICD-10-CM | POA: Diagnosis not present

## 2020-10-12 DIAGNOSIS — Z471 Aftercare following joint replacement surgery: Secondary | ICD-10-CM | POA: Diagnosis not present

## 2020-10-12 DIAGNOSIS — H9193 Unspecified hearing loss, bilateral: Secondary | ICD-10-CM | POA: Diagnosis not present

## 2020-10-12 DIAGNOSIS — M1711 Unilateral primary osteoarthritis, right knee: Secondary | ICD-10-CM | POA: Diagnosis not present

## 2020-10-12 DIAGNOSIS — F419 Anxiety disorder, unspecified: Secondary | ICD-10-CM | POA: Diagnosis not present

## 2020-10-12 NOTE — Telephone Encounter (Signed)
Ortho bundle D/C call completed. 

## 2020-10-15 ENCOUNTER — Telehealth: Payer: Self-pay

## 2020-10-15 DIAGNOSIS — M1711 Unilateral primary osteoarthritis, right knee: Secondary | ICD-10-CM | POA: Diagnosis not present

## 2020-10-15 DIAGNOSIS — Z471 Aftercare following joint replacement surgery: Secondary | ICD-10-CM | POA: Diagnosis not present

## 2020-10-15 DIAGNOSIS — H9193 Unspecified hearing loss, bilateral: Secondary | ICD-10-CM | POA: Diagnosis not present

## 2020-10-15 DIAGNOSIS — I83813 Varicose veins of bilateral lower extremities with pain: Secondary | ICD-10-CM | POA: Diagnosis not present

## 2020-10-15 DIAGNOSIS — F419 Anxiety disorder, unspecified: Secondary | ICD-10-CM | POA: Diagnosis not present

## 2020-10-15 DIAGNOSIS — I10 Essential (primary) hypertension: Secondary | ICD-10-CM | POA: Diagnosis not present

## 2020-10-15 DIAGNOSIS — F32A Depression, unspecified: Secondary | ICD-10-CM | POA: Diagnosis not present

## 2020-10-15 DIAGNOSIS — I2510 Atherosclerotic heart disease of native coronary artery without angina pectoris: Secondary | ICD-10-CM | POA: Diagnosis not present

## 2020-10-15 DIAGNOSIS — E669 Obesity, unspecified: Secondary | ICD-10-CM | POA: Diagnosis not present

## 2020-10-15 NOTE — Telephone Encounter (Signed)
Patient called regarding physical therapy he stated the therapists told him that they are not going to put him on the schedule this week due to his insurance having to approve his visits patient stated he needs to have therapy ASAP he is requesting a call back regarding other options call back:

## 2020-10-15 NOTE — Telephone Encounter (Signed)
Patient states [facility] called him and they will be coming at

## 2020-10-16 ENCOUNTER — Telehealth: Payer: Self-pay | Admitting: Orthopaedic Surgery

## 2020-10-16 ENCOUNTER — Telehealth: Payer: Self-pay | Admitting: *Deleted

## 2020-10-16 DIAGNOSIS — R32 Unspecified urinary incontinence: Secondary | ICD-10-CM | POA: Diagnosis not present

## 2020-10-16 MED ORDER — OXYCODONE HCL 5 MG PO TABS: 5.0000 mg | ORAL_TABLET | ORAL | 0 refills | Status: DC | PRN

## 2020-10-16 NOTE — Telephone Encounter (Signed)
Please advise 

## 2020-10-16 NOTE — Telephone Encounter (Signed)
Ortho bundle 7 day call completed. 

## 2020-10-16 NOTE — Telephone Encounter (Signed)
Patient called. He would like a refill on oxycodone called in. His call back number is [phone]

## 2020-10-17 ENCOUNTER — Telehealth: Payer: Self-pay | Admitting: Orthopaedic Surgery

## 2020-10-17 NOTE — Telephone Encounter (Signed)
Patient called. He would like a call. Says he is still really swollen even after icing it. His call back number is [phone]

## 2020-10-18 DIAGNOSIS — Z471 Aftercare following joint replacement surgery: Secondary | ICD-10-CM | POA: Diagnosis not present

## 2020-10-18 DIAGNOSIS — F419 Anxiety disorder, unspecified: Secondary | ICD-10-CM | POA: Diagnosis not present

## 2020-10-18 DIAGNOSIS — E669 Obesity, unspecified: Secondary | ICD-10-CM | POA: Diagnosis not present

## 2020-10-18 DIAGNOSIS — I10 Essential (primary) hypertension: Secondary | ICD-10-CM | POA: Diagnosis not present

## 2020-10-18 DIAGNOSIS — I83813 Varicose veins of bilateral lower extremities with pain: Secondary | ICD-10-CM | POA: Diagnosis not present

## 2020-10-18 DIAGNOSIS — H9193 Unspecified hearing loss, bilateral: Secondary | ICD-10-CM | POA: Diagnosis not present

## 2020-10-18 DIAGNOSIS — M1711 Unilateral primary osteoarthritis, right knee: Secondary | ICD-10-CM | POA: Diagnosis not present

## 2020-10-18 DIAGNOSIS — F32A Depression, unspecified: Secondary | ICD-10-CM | POA: Diagnosis not present

## 2020-10-18 DIAGNOSIS — I2510 Atherosclerotic heart disease of native coronary artery without angina pectoris: Secondary | ICD-10-CM | POA: Diagnosis not present

## 2020-10-18 NOTE — Telephone Encounter (Signed)
Lvm for pt to call back. 

## 2020-10-19 DIAGNOSIS — I83813 Varicose veins of bilateral lower extremities with pain: Secondary | ICD-10-CM | POA: Diagnosis not present

## 2020-10-19 DIAGNOSIS — H9193 Unspecified hearing loss, bilateral: Secondary | ICD-10-CM | POA: Diagnosis not present

## 2020-10-19 DIAGNOSIS — Z471 Aftercare following joint replacement surgery: Secondary | ICD-10-CM | POA: Diagnosis not present

## 2020-10-19 DIAGNOSIS — M1711 Unilateral primary osteoarthritis, right knee: Secondary | ICD-10-CM | POA: Diagnosis not present

## 2020-10-19 DIAGNOSIS — E669 Obesity, unspecified: Secondary | ICD-10-CM | POA: Diagnosis not present

## 2020-10-19 DIAGNOSIS — F419 Anxiety disorder, unspecified: Secondary | ICD-10-CM | POA: Diagnosis not present

## 2020-10-19 DIAGNOSIS — I10 Essential (primary) hypertension: Secondary | ICD-10-CM | POA: Diagnosis not present

## 2020-10-19 DIAGNOSIS — I2510 Atherosclerotic heart disease of native coronary artery without angina pectoris: Secondary | ICD-10-CM | POA: Diagnosis not present

## 2020-10-19 DIAGNOSIS — F32A Depression, unspecified: Secondary | ICD-10-CM | POA: Diagnosis not present

## 2020-10-19 NOTE — Telephone Encounter (Signed)
I spoke with pt and therapist, therapist was not concerned for blood clot. Pt was calling saying he never received his ice machine. Can you help with this please? I did tell pt you wont be in the office until Monday, thank you

## 2020-10-19 NOTE — Telephone Encounter (Signed)
I called him and let him know. He said he would call his insurance company, thank you

## 2020-10-19 NOTE — Telephone Encounter (Signed)
I did talk to him yesterday and told him I'd try to see if they'd provide the ice machine after he was discharged. It should've been put on in the OR, but maybe Dr. Ninfa Linden did not ask for it. Could you call him and let him know that I can't get it for him through the hospital and that he should call [employer] to see if they'll provide one for him? Otherwise, good old ice packs will help and provide the best icing for him. Thank you!

## 2020-10-22 ENCOUNTER — Telehealth: Payer: Self-pay

## 2020-10-22 ENCOUNTER — Other Ambulatory Visit: Payer: Self-pay | Admitting: Orthopaedic Surgery

## 2020-10-22 DIAGNOSIS — M1711 Unilateral primary osteoarthritis, right knee: Secondary | ICD-10-CM | POA: Diagnosis not present

## 2020-10-22 DIAGNOSIS — H9193 Unspecified hearing loss, bilateral: Secondary | ICD-10-CM | POA: Diagnosis not present

## 2020-10-22 DIAGNOSIS — E669 Obesity, unspecified: Secondary | ICD-10-CM | POA: Diagnosis not present

## 2020-10-22 DIAGNOSIS — I2510 Atherosclerotic heart disease of native coronary artery without angina pectoris: Secondary | ICD-10-CM | POA: Diagnosis not present

## 2020-10-22 DIAGNOSIS — F419 Anxiety disorder, unspecified: Secondary | ICD-10-CM | POA: Diagnosis not present

## 2020-10-22 DIAGNOSIS — F32A Depression, unspecified: Secondary | ICD-10-CM | POA: Diagnosis not present

## 2020-10-22 DIAGNOSIS — I10 Essential (primary) hypertension: Secondary | ICD-10-CM | POA: Diagnosis not present

## 2020-10-22 DIAGNOSIS — Z471 Aftercare following joint replacement surgery: Secondary | ICD-10-CM | POA: Diagnosis not present

## 2020-10-22 DIAGNOSIS — I83813 Varicose veins of bilateral lower extremities with pain: Secondary | ICD-10-CM | POA: Diagnosis not present

## 2020-10-22 MED ORDER — OXYCODONE HCL 5 MG PO TABS: 5.0000 mg | ORAL_TABLET | Freq: Four times a day (QID) | ORAL | 0 refills | Status: DC | PRN

## 2020-10-22 NOTE — Telephone Encounter (Signed)
Please advise 

## 2020-10-22 NOTE — Telephone Encounter (Signed)
Patient called he is requesting rx refill for oxycodone call back:[phone]

## 2020-10-23 ENCOUNTER — Encounter: Payer: Self-pay | Admitting: Orthopaedic Surgery

## 2020-10-23 ENCOUNTER — Ambulatory Visit (INDEPENDENT_AMBULATORY_CARE_PROVIDER_SITE_OTHER): Payer: Medicare HMO | Admitting: Orthopaedic Surgery

## 2020-10-23 ENCOUNTER — Telehealth: Payer: Self-pay | Admitting: *Deleted

## 2020-10-23 DIAGNOSIS — Z96652 Presence of left artificial knee joint: Secondary | ICD-10-CM

## 2020-10-23 NOTE — Telephone Encounter (Signed)
Ortho bundle 14 day in office meeting completed. °

## 2020-10-23 NOTE — Progress Notes (Signed)
The patient is 2 weeks status post a left total knee arthroplasty.  He reports that he is doing well.  He is scheduled to start outpatient therapy this Friday at [facility].  He has been on a baby aspirin twice a day.  He reports increased motion and strength however he is only been able to flex to about 70 degrees.  His calf is soft on the operative left leg.  I did remove all staples in place Steri-Strips.  He lacks full extension by about 5 degrees and I can flex him to about 75 degrees.  He understands that it is essential we get him in the outpatient therapy to work on getting the knee bending and moving.  All questions and concerns were answered and addressed.  I will see him back in 4 weeks to see how he is doing overall with a repeat exam but no x-rays are needed.

## 2020-10-24 ENCOUNTER — Encounter: Payer: Self-pay | Admitting: Family Medicine

## 2020-10-25 ENCOUNTER — Telehealth: Payer: Self-pay | Admitting: Family Medicine

## 2020-10-25 NOTE — Telephone Encounter (Signed)
Patient returning call to Dr. Junius Roads. Please call patient at [phone].

## 2020-10-25 NOTE — Telephone Encounter (Signed)
I didn't call

## 2020-10-25 NOTE — Telephone Encounter (Signed)
The patient is taking Oxycodone 5 mg, 1 first thing in the morning and then 1 before bedtime. Between these doses, he takes Advil 200 mg, 2 every  8 hours or so. He has done some reading about some pain relievers having adverse reactions on kidneys &/or liver. He has taken a lot of Advil over the years for pains. He wants to know if he is doing any damage, by taking these. Please advise.

## 2020-10-26 DIAGNOSIS — M25662 Stiffness of left knee, not elsewhere classified: Secondary | ICD-10-CM | POA: Diagnosis not present

## 2020-10-26 DIAGNOSIS — M25562 Pain in left knee: Secondary | ICD-10-CM | POA: Diagnosis not present

## 2020-10-26 DIAGNOSIS — Z96652 Presence of left artificial knee joint: Secondary | ICD-10-CM | POA: Diagnosis not present

## 2020-10-26 DIAGNOSIS — R269 Unspecified abnormalities of gait and mobility: Secondary | ICD-10-CM | POA: Diagnosis not present

## 2020-10-26 NOTE — Telephone Encounter (Signed)
His recent labs showed normal kidney function.  But he's correct - it can cause damage when used long-term, so it's good to monitor kidney function every 3-6 months while taking it.

## 2020-10-26 NOTE — Telephone Encounter (Signed)
I called and left this information on his voice mail. Advised him to call back or send a MyChart message if any additional concerns/questions.

## 2020-10-29 ENCOUNTER — Telehealth: Payer: Self-pay | Admitting: Orthopaedic Surgery

## 2020-10-29 DIAGNOSIS — M25562 Pain in left knee: Secondary | ICD-10-CM | POA: Diagnosis not present

## 2020-10-29 DIAGNOSIS — Z96652 Presence of left artificial knee joint: Secondary | ICD-10-CM | POA: Diagnosis not present

## 2020-10-29 DIAGNOSIS — M25662 Stiffness of left knee, not elsewhere classified: Secondary | ICD-10-CM | POA: Diagnosis not present

## 2020-10-29 DIAGNOSIS — R269 Unspecified abnormalities of gait and mobility: Secondary | ICD-10-CM | POA: Diagnosis not present

## 2020-10-29 MED ORDER — OXYCODONE HCL 5 MG PO TABS: 5.0000 mg | ORAL_TABLET | Freq: Four times a day (QID) | ORAL | 0 refills | Status: DC | PRN

## 2020-10-29 NOTE — Telephone Encounter (Signed)
Patient called. He would like some pain medication called in for him. His call back number is [phone]

## 2020-10-29 NOTE — Telephone Encounter (Signed)
Please advise 

## 2020-10-31 DIAGNOSIS — Z96652 Presence of left artificial knee joint: Secondary | ICD-10-CM | POA: Diagnosis not present

## 2020-10-31 DIAGNOSIS — R269 Unspecified abnormalities of gait and mobility: Secondary | ICD-10-CM | POA: Diagnosis not present

## 2020-10-31 DIAGNOSIS — M25562 Pain in left knee: Secondary | ICD-10-CM | POA: Diagnosis not present

## 2020-10-31 DIAGNOSIS — M25662 Stiffness of left knee, not elsewhere classified: Secondary | ICD-10-CM | POA: Diagnosis not present

## 2020-11-02 ENCOUNTER — Other Ambulatory Visit: Payer: Self-pay | Admitting: Orthopaedic Surgery

## 2020-11-06 ENCOUNTER — Telehealth: Payer: Self-pay | Admitting: Orthopaedic Surgery

## 2020-11-06 DIAGNOSIS — R269 Unspecified abnormalities of gait and mobility: Secondary | ICD-10-CM | POA: Diagnosis not present

## 2020-11-06 DIAGNOSIS — M25662 Stiffness of left knee, not elsewhere classified: Secondary | ICD-10-CM | POA: Diagnosis not present

## 2020-11-06 DIAGNOSIS — Z96652 Presence of left artificial knee joint: Secondary | ICD-10-CM | POA: Diagnosis not present

## 2020-11-06 DIAGNOSIS — M25562 Pain in left knee: Secondary | ICD-10-CM | POA: Diagnosis not present

## 2020-11-06 MED ORDER — OXYCODONE HCL 5 MG PO TABS: 5.0000 mg | ORAL_TABLET | Freq: Four times a day (QID) | ORAL | 0 refills | Status: DC | PRN

## 2020-11-06 NOTE — Telephone Encounter (Signed)
Pt states he can't remember the name but would like a refill of his pain rx sent to his [facility].

## 2020-11-08 DIAGNOSIS — M25562 Pain in left knee: Secondary | ICD-10-CM | POA: Diagnosis not present

## 2020-11-08 DIAGNOSIS — R269 Unspecified abnormalities of gait and mobility: Secondary | ICD-10-CM | POA: Diagnosis not present

## 2020-11-08 DIAGNOSIS — Z96652 Presence of left artificial knee joint: Secondary | ICD-10-CM | POA: Diagnosis not present

## 2020-11-08 DIAGNOSIS — M25662 Stiffness of left knee, not elsewhere classified: Secondary | ICD-10-CM | POA: Diagnosis not present

## 2020-11-09 ENCOUNTER — Telehealth: Payer: Self-pay | Admitting: *Deleted

## 2020-11-09 NOTE — Telephone Encounter (Signed)
30 day Ortho bundle call completed with patient satisfaction survey.

## 2020-11-13 DIAGNOSIS — M25562 Pain in left knee: Secondary | ICD-10-CM | POA: Diagnosis not present

## 2020-11-13 DIAGNOSIS — Z96652 Presence of left artificial knee joint: Secondary | ICD-10-CM | POA: Diagnosis not present

## 2020-11-13 DIAGNOSIS — M25662 Stiffness of left knee, not elsewhere classified: Secondary | ICD-10-CM | POA: Diagnosis not present

## 2020-11-13 DIAGNOSIS — R269 Unspecified abnormalities of gait and mobility: Secondary | ICD-10-CM | POA: Diagnosis not present

## 2020-11-15 ENCOUNTER — Telehealth: Payer: Self-pay | Admitting: Family Medicine

## 2020-11-15 ENCOUNTER — Telehealth: Payer: Self-pay

## 2020-11-15 ENCOUNTER — Other Ambulatory Visit: Payer: Self-pay | Admitting: Orthopaedic Surgery

## 2020-11-15 DIAGNOSIS — R32 Unspecified urinary incontinence: Secondary | ICD-10-CM | POA: Diagnosis not present

## 2020-11-15 DIAGNOSIS — Z96652 Presence of left artificial knee joint: Secondary | ICD-10-CM | POA: Diagnosis not present

## 2020-11-15 DIAGNOSIS — M25662 Stiffness of left knee, not elsewhere classified: Secondary | ICD-10-CM | POA: Diagnosis not present

## 2020-11-15 DIAGNOSIS — M48061 Spinal stenosis, lumbar region without neurogenic claudication: Secondary | ICD-10-CM

## 2020-11-15 DIAGNOSIS — M25562 Pain in left knee: Secondary | ICD-10-CM | POA: Diagnosis not present

## 2020-11-15 DIAGNOSIS — G8929 Other chronic pain: Secondary | ICD-10-CM

## 2020-11-15 DIAGNOSIS — M545 Low back pain, unspecified: Secondary | ICD-10-CM

## 2020-11-15 DIAGNOSIS — R269 Unspecified abnormalities of gait and mobility: Secondary | ICD-10-CM | POA: Diagnosis not present

## 2020-11-15 MED ORDER — OXYCODONE HCL 5 MG PO TABS: 5.0000 mg | ORAL_TABLET | Freq: Four times a day (QID) | ORAL | 0 refills | Status: DC | PRN

## 2020-11-15 NOTE — Telephone Encounter (Signed)
Patient called he is requesting a rx refill for oxycodone call back:[phone]

## 2020-11-15 NOTE — Telephone Encounter (Signed)
Patient called asking if Dr Georgina Snell would willing to send over an order for physical therapy on his back. He is currently being seen at [facility] for his knee after a knee replacement and the therapist said he would be able to help him with his back as well. He thinks the back pain in causing him to compensate in other areas and giving more knee issues.  Please advise.

## 2020-11-16 NOTE — Telephone Encounter (Signed)
PT order in.  We should be faxing the order over to [facility] physical therapy today.

## 2020-11-17 ENCOUNTER — Other Ambulatory Visit: Payer: Self-pay | Admitting: Orthopaedic Surgery

## 2020-11-20 DIAGNOSIS — M25662 Stiffness of left knee, not elsewhere classified: Secondary | ICD-10-CM | POA: Diagnosis not present

## 2020-11-20 DIAGNOSIS — R269 Unspecified abnormalities of gait and mobility: Secondary | ICD-10-CM | POA: Diagnosis not present

## 2020-11-20 DIAGNOSIS — M25562 Pain in left knee: Secondary | ICD-10-CM | POA: Diagnosis not present

## 2020-11-20 DIAGNOSIS — Z96652 Presence of left artificial knee joint: Secondary | ICD-10-CM | POA: Diagnosis not present

## 2020-11-21 ENCOUNTER — Encounter: Payer: Self-pay | Admitting: Orthopaedic Surgery

## 2020-11-21 ENCOUNTER — Ambulatory Visit (INDEPENDENT_AMBULATORY_CARE_PROVIDER_SITE_OTHER): Payer: Medicare HMO | Admitting: Orthopaedic Surgery

## 2020-11-21 DIAGNOSIS — R269 Unspecified abnormalities of gait and mobility: Secondary | ICD-10-CM | POA: Diagnosis not present

## 2020-11-21 DIAGNOSIS — Z96652 Presence of left artificial knee joint: Secondary | ICD-10-CM

## 2020-11-21 DIAGNOSIS — M25662 Stiffness of left knee, not elsewhere classified: Secondary | ICD-10-CM | POA: Diagnosis not present

## 2020-11-21 DIAGNOSIS — M25562 Pain in left knee: Secondary | ICD-10-CM | POA: Diagnosis not present

## 2020-11-21 NOTE — Progress Notes (Signed)
The patient is now 6 weeks status post a left total knee arthroplasty.  He says he is about 85% better.  He is actually pushed himself very well through strengthening and motion of that left knee.  He is walking without assistive device.  He is still having appropriate postoperative pain and we have refilled his pain medication.  I talked him about weaning this over time.  Examination of his left operative knee shows a well-healed surgical incision.  There is moderate swelling to be expected.  His extension is full and his flexion is to about 100 degrees.  The left knee feels ligamentously stable.  At this point I will see him back in 3 months.  At that visit I would like a standing AP and lateral of his left knee.  At that point we can also work on scheduling him sometime in November for a right total knee replacement given the severe arthritis of his right knee.  All questions and concerns were answered and addressed.

## 2020-11-22 ENCOUNTER — Telehealth: Payer: Self-pay

## 2020-11-22 ENCOUNTER — Other Ambulatory Visit: Payer: Self-pay | Admitting: Orthopaedic Surgery

## 2020-11-22 MED ORDER — OXYCODONE HCL 5 MG PO TABS: 5.0000 mg | ORAL_TABLET | Freq: Four times a day (QID) | ORAL | 0 refills | Status: DC | PRN

## 2020-11-22 NOTE — Telephone Encounter (Signed)
Pt called and needs a refill on his oxycodone

## 2020-11-22 NOTE — Telephone Encounter (Signed)
Please advise 

## 2020-11-28 DIAGNOSIS — M25562 Pain in left knee: Secondary | ICD-10-CM | POA: Diagnosis not present

## 2020-11-28 DIAGNOSIS — M25662 Stiffness of left knee, not elsewhere classified: Secondary | ICD-10-CM | POA: Diagnosis not present

## 2020-11-28 DIAGNOSIS — Z96652 Presence of left artificial knee joint: Secondary | ICD-10-CM | POA: Diagnosis not present

## 2020-11-28 DIAGNOSIS — R269 Unspecified abnormalities of gait and mobility: Secondary | ICD-10-CM | POA: Diagnosis not present

## 2020-11-28 NOTE — Progress Notes (Signed)
I, Peterson Lombard, LAT, ATC acting as a scribe for Lynne Leader, MD.  Samuel [name] is a 65 y.o. male who presents to [facility] today for back pain.  He was last seen by Dr. Georgina Snell on 08/15/20 for R hip/thigh pain and chronic LBP.  He had a R hip XR and was advised to return for a potential hip injection prior to his scheduled L knee TKR on 10/09/20.  He's had prior lumbar ESI, L L3-4 epidural injection on 12/01/19 and 04/09/20 and repeat ESI on 05/14/20 (B L4-5).  Since his last visit, pt reports back pain is terrible. Pt did not get any relief from the prior ESI that was done in Nov. Pt had a TKR in April and is healing well. Pt has had 3 PT sessions focusing on his back.   Diagnostic imaging: R hip XR-08/15/20; L-spine MRI- 11/19/19; L-spine XR- 05/02/19  Pertinent review of systems: No fevers or chills  Relevant historical information: Hypertension.  History of alcohol abuse currently in remission.  Recent left total knee replacement.   Exam:  BP (!) 166/96 (BP Location: Right Arm, Patient Position: Sitting, Cuff Size: Normal)   Pulse 82   Ht 5\' 6"  (1.676 m)   Wt 242 lb (109.8 kg)   SpO2 99%   BMI 39.06 kg/m  General: Well Developed, well nourished, and in no acute distress.   MSK: L-spine normal.  Nontender midline.  Tender palpation bilateral SI joints.    Lab and Radiology Results  Procedure: Real-time Ultrasound Guided Injection of right SI joint Device: Philips Affiniti 50G Images permanently stored and available for review in PACS Verbal informed consent obtained.  Discussed risks and benefits of procedure. Warned about infection bleeding damage to structures skin hypopigmentation and fat atrophy among others. Patient expresses understanding and agreement Time-out conducted.   Noted no overlying erythema, induration, or other signs of local infection.   Skin prepped in a sterile fashion.   Local anesthesia: Topical Ethyl chloride.   With  sterile technique and under real time ultrasound guidance: 40 mg of Kenalog and 2 mL of Marcaine injected into SI joint. Fluid seen entering the joint capsule.   Completed without difficulty   Pain immediately resolved suggesting accurate placement of the medication.   Advised to call if fevers/chills, erythema, induration, drainage, or persistent bleeding.   Images permanently stored and available for review in the ultrasound unit.  Impression: Technically successful ultrasound guided injection.    Procedure: Real-time Ultrasound Guided Injection of left SI joint Device: Philips Affiniti 50G Images permanently stored and available for review in PACS Verbal informed consent obtained.  Discussed risks and benefits of procedure. Warned about infection bleeding damage to structures skin hypopigmentation and fat atrophy among others. Patient expresses understanding and agreement Time-out conducted.   Noted no overlying erythema, induration, or other signs of local infection.   Skin prepped in a sterile fashion.   Local anesthesia: Topical Ethyl chloride.   With sterile technique and under real time ultrasound guidance: 40 mg of Kenalog and 2 mL of Marcaine injected into SI joint. Fluid seen entering the joint capsule.   Completed without difficulty   Pain immediately resolved suggesting accurate placement of the medication.   Advised to call if fevers/chills, erythema, induration, drainage, or persistent bleeding.   Images permanently stored and available for review in the ultrasound unit.  Impression: Technically successful ultrasound guided injection.        Assessment and Plan:  65 y.o. male with bilateral low back pain.  Pain is thought to be multifactorial.  I do believe he has some pain due to facet joint but did not have good response to facet joint injection L4-L5 last year.  Today he is tender to palpation at bilateral SI joints so we will go ahead and try diagnostic and hopefully  therapeutic injection of bilateral SI joints.  He did have moderate benefit in pain after the injection in clinic today indicates that this is probably one of his pain generators.  Patient will keep me updated with how he responds to injection.  He may be a good candidate for pain management referral as he may benefit from further SI joint ablations or even other facet medial branch block and ablations or injections in the future.   PDMP not reviewed this encounter. Orders Placed This Encounter  Procedures   [place] LIMITED JOINT SPACE STRUCTURES LOW BILAT(NO LINKED CHARGES)    Order Specific Question:   Reason for Exam (SYMPTOM  OR DIAGNOSIS REQUIRED)    Answer:   si joint pain    Order Specific Question:   Preferred imaging location?    Answer:   [place]   No orders of the defined types were placed in this encounter.    Discussed warning signs or symptoms. Please see discharge instructions. Patient expresses understanding.   The above documentation has been reviewed and is accurate and complete Lynne Leader, M.D.  Total encounter time 30 minutes including face-to-face time with the patient and, reviewing past medical record, and charting on the date of service.   Discussed treatment plan and options and management strategies into the future.  Time-based billing excludes time perform injection today.

## 2020-11-29 ENCOUNTER — Ambulatory Visit (INDEPENDENT_AMBULATORY_CARE_PROVIDER_SITE_OTHER): Payer: Medicare HMO | Admitting: Family Medicine

## 2020-11-29 ENCOUNTER — Telehealth: Payer: Self-pay | Admitting: Orthopaedic Surgery

## 2020-11-29 ENCOUNTER — Other Ambulatory Visit: Payer: Self-pay | Admitting: Orthopaedic Surgery

## 2020-11-29 ENCOUNTER — Ambulatory Visit: Payer: Self-pay

## 2020-11-29 ENCOUNTER — Other Ambulatory Visit: Payer: Self-pay

## 2020-11-29 VITALS — BP 166/96 | HR 82 | Ht 66.0 in | Wt 242.0 lb

## 2020-11-29 DIAGNOSIS — M0088 Arthritis due to other bacteria, vertebrae: Secondary | ICD-10-CM

## 2020-11-29 MED ORDER — OXYCODONE HCL 5 MG PO TABS: 5.0000 mg | ORAL_TABLET | Freq: Four times a day (QID) | ORAL | 0 refills | Status: DC | PRN

## 2020-11-29 NOTE — Telephone Encounter (Signed)
Patient called needing Rx refilled (Oxycodone) The number to contact patient is [phone]

## 2020-11-29 NOTE — Telephone Encounter (Signed)
Lvm informing pt.

## 2020-11-29 NOTE — Telephone Encounter (Signed)
Please advise 

## 2020-11-29 NOTE — Patient Instructions (Signed)
Thank you for coming in today.   Call or go to the ER if you develop a large red swollen joint with extreme pain or oozing puss.    Let me know how you feel a few hours into the shot while it numb.   If this works but does not last we can get those nerves ablated.   I can also get you to pain management to discusses back injection mores.

## 2020-11-30 DIAGNOSIS — M25662 Stiffness of left knee, not elsewhere classified: Secondary | ICD-10-CM | POA: Diagnosis not present

## 2020-11-30 DIAGNOSIS — Z96652 Presence of left artificial knee joint: Secondary | ICD-10-CM | POA: Diagnosis not present

## 2020-11-30 DIAGNOSIS — M25562 Pain in left knee: Secondary | ICD-10-CM | POA: Diagnosis not present

## 2020-11-30 DIAGNOSIS — R269 Unspecified abnormalities of gait and mobility: Secondary | ICD-10-CM | POA: Diagnosis not present

## 2020-12-03 ENCOUNTER — Encounter: Payer: Self-pay | Admitting: Family Medicine

## 2020-12-03 ENCOUNTER — Other Ambulatory Visit: Payer: Self-pay

## 2020-12-03 ENCOUNTER — Ambulatory Visit (INDEPENDENT_AMBULATORY_CARE_PROVIDER_SITE_OTHER): Payer: Medicare HMO | Admitting: Family Medicine

## 2020-12-03 DIAGNOSIS — M25572 Pain in left ankle and joints of left foot: Secondary | ICD-10-CM | POA: Diagnosis not present

## 2020-12-03 DIAGNOSIS — M1711 Unilateral primary osteoarthritis, right knee: Secondary | ICD-10-CM | POA: Diagnosis not present

## 2020-12-03 NOTE — Progress Notes (Signed)
Office Visit Note   Patient: Samuel [name]           Date of Birth: March 24, 1956           MRN: [id number] Visit Date: 12/03/2020 Requested by: Eunice Blase, MD [address] [place],  [place] [address] PCP: Eunice Blase, MD  Subjective: Chief Complaint  Patient presents with   Left Great Toe - Pain    The great toe is starting to turn inward and under the 2nd toe -- it is hurting. He would like to stop the progress of this early.    HPI: He is here with 2 concerns.  His right foot has been bothering him for the past 4 to 5 months.  No injury, his great toe seems to be drifting laterally.  He has pain primarily at the first MTP joint.  He is doing very well from a left knee replacement standpoint but now his right knee is hurting even more, and he would like to proceed with right knee replacement.                ROS:   All other systems were reviewed and are negative.  Objective: Vital Signs: There were no vitals taken for this visit.  Physical Exam:  General:  Alert and oriented, in no acute distress. Pulm:  Breathing unlabored. Psy:  Normal mood, congruent affect.  Left foot: He has normal arches nonweightbearing but longitudinal arch collapse and hyperpronation with weightbearing as well as lateral deviation of the great toe.  He has hallux rigidus and tenderness to palpation on the dorsum of the first MTP joint.  No effusion, no warmth or erythema.    Imaging: No results found.  Assessment & Plan: Left great toe MTP DJD -Encouraged him to purchase over-the-counter arch supports and a bunion splint to wear at night. -Could consider custom orthotics if needed.  Consider first MTP injection if pain became more severe.  2.  Right knee osteoarthritis - Proceed with replacement when able with Dr. Ninfa Linden.     Procedures: No procedures performed        PMFS History: Patient Active Problem List   Diagnosis Date Noted   Status post left knee replacement  10/09/2020   Unilateral primary osteoarthritis, left knee 06/22/2018   Unilateral primary osteoarthritis, right knee 06/22/2018   Screening for colorectal cancer 05/17/2018   Nonobstructive atherosclerosis of coronary artery 03/05/2018   Obesity (BMI 30-39.9) 03/05/2018   Former heavy cigarette smoker (20-39 per day) 03/05/2018   Varicose veins of bilateral lower extremities with pain 02/77/4128   Umbilical hernia 78/67/6720   Alcohol abuse, in remission 09/17/2017   Osteoarthritis of knee 08/10/2017   Rosacea 06/11/2017   Subclinical hypothyroidism 06/05/2017   Essential hypertension 05/27/2017   Bilateral hearing loss 05/04/2014   Past Medical History:  Diagnosis Date   Anxiety    Arthritis    Basal cell carcinoma 04/29/2018   upper mid back, right mid back    Basal cell carcinoma 04/29/2018   right mid back    Basal cell carcinoma 06/10/2018   left lover back    Coronary artery disease    Mild-moderate non-obstructive CAD 06/15/17 (20% dLM, 50% mLAD, 30% OM3, 40% mRCA, 40% dRCA)   COVID-19 unknown   Depression    Former heavy cigarette smoker (20-39 per day) 03/05/2018   Quit 2000; 60 pak year history   HTN (hypertension)     Family History  Problem Relation Age of Onset  Dementia Mother        alcohol   Alcohol abuse Mother    Heart attack Father 83   Heart disease Father 24   COPD Father    Stroke Father    Healthy Daughter    Healthy Son    Healthy Son    CAD Brother    Heart attack Brother     Past Surgical History:  Procedure Laterality Date   HERNIA REPAIR     KNEE ARTHROSCOPY Bilateral    LEFT HEART CATH AND CORONARY ANGIOGRAPHY N/A 06/15/2017   Procedure: LEFT HEART CATH AND CORONARY ANGIOGRAPHY;  Surgeon: Nelva Bush, MD;  Location: [facility];  Service: Cardiovascular;  Laterality: N/A;   TONSILLECTOMY     TOTAL KNEE ARTHROPLASTY Left 10/09/2020   Procedure: LEFT TOTAL KNEE ARTHROPLASTY;  Surgeon: Mcarthur Rossetti, MD;   Location: [facility];  Service: Orthopedics;  Laterality: Left;   UMBILICAL HERNIA REPAIR N/A 11/03/2017   Procedure: UMBILICAL HERNIA REPAIR;  Surgeon: Clovis Riley, MD;  Location: [facility];  Service: General;  Laterality: N/A;   Social History   Occupational History   Occupation: disabled  Tobacco Use   Smoking status: Former    Packs/day: 2.00    Years: 30.00    Pack years: 60.00    Types: Cigarettes    Quit date: 06/16/1998    Years since quitting: 22.4   Smokeless tobacco: Never  Vaping Use   Vaping Use: Never used  Substance and Sexual Activity   Alcohol use: No    Comment: sober 08-28-2017   Drug use: Not Currently   Sexual activity: Yes    Partners: Female

## 2020-12-04 ENCOUNTER — Telehealth: Payer: Self-pay | Admitting: Family Medicine

## 2020-12-04 DIAGNOSIS — G8929 Other chronic pain: Secondary | ICD-10-CM

## 2020-12-04 DIAGNOSIS — M25662 Stiffness of left knee, not elsewhere classified: Secondary | ICD-10-CM | POA: Diagnosis not present

## 2020-12-04 DIAGNOSIS — M545 Low back pain, unspecified: Secondary | ICD-10-CM

## 2020-12-04 DIAGNOSIS — R269 Unspecified abnormalities of gait and mobility: Secondary | ICD-10-CM | POA: Diagnosis not present

## 2020-12-04 DIAGNOSIS — M48061 Spinal stenosis, lumbar region without neurogenic claudication: Secondary | ICD-10-CM

## 2020-12-04 DIAGNOSIS — Z96652 Presence of left artificial knee joint: Secondary | ICD-10-CM | POA: Diagnosis not present

## 2020-12-04 DIAGNOSIS — M25562 Pain in left knee: Secondary | ICD-10-CM | POA: Diagnosis not present

## 2020-12-04 NOTE — Telephone Encounter (Signed)
Pt seen 6/16 for back pain, the injections have not lasted ( especially not on the L side ). Pt would like to move to [facility] ( thinks this is [facility] ) as he wants to improve his back before he gets the other knee replacement.

## 2020-12-07 DIAGNOSIS — R269 Unspecified abnormalities of gait and mobility: Secondary | ICD-10-CM | POA: Diagnosis not present

## 2020-12-07 DIAGNOSIS — M25562 Pain in left knee: Secondary | ICD-10-CM | POA: Diagnosis not present

## 2020-12-07 DIAGNOSIS — Z96652 Presence of left artificial knee joint: Secondary | ICD-10-CM | POA: Diagnosis not present

## 2020-12-07 DIAGNOSIS — M25662 Stiffness of left knee, not elsewhere classified: Secondary | ICD-10-CM | POA: Diagnosis not present

## 2020-12-07 NOTE — Telephone Encounter (Signed)
Called pt and relayed Dr. Clovis Riley message and he verbalizes understanding.

## 2020-12-07 NOTE — Telephone Encounter (Signed)
Referral to pain management placed. It may take a few weeks

## 2020-12-10 ENCOUNTER — Encounter: Payer: Medicare HMO | Admitting: Family Medicine

## 2020-12-13 ENCOUNTER — Telehealth: Payer: Self-pay | Admitting: Orthopaedic Surgery

## 2020-12-13 DIAGNOSIS — M25662 Stiffness of left knee, not elsewhere classified: Secondary | ICD-10-CM | POA: Diagnosis not present

## 2020-12-13 DIAGNOSIS — M25562 Pain in left knee: Secondary | ICD-10-CM | POA: Diagnosis not present

## 2020-12-13 DIAGNOSIS — Z96652 Presence of left artificial knee joint: Secondary | ICD-10-CM | POA: Diagnosis not present

## 2020-12-13 DIAGNOSIS — R269 Unspecified abnormalities of gait and mobility: Secondary | ICD-10-CM | POA: Diagnosis not present

## 2020-12-13 NOTE — Telephone Encounter (Signed)
Pt called and want to talk about his right knee pain.  CB [phone]

## 2020-12-13 NOTE — Telephone Encounter (Signed)
Wanted you to know he is ready to go ahead and schedule his right knee replacement

## 2020-12-14 DIAGNOSIS — R32 Unspecified urinary incontinence: Secondary | ICD-10-CM | POA: Diagnosis not present

## 2020-12-14 NOTE — Telephone Encounter (Signed)
FYI   I lvm informing pt you would call him soon to schedule, thanks

## 2020-12-18 ENCOUNTER — Telehealth: Payer: Self-pay | Admitting: Family Medicine

## 2020-12-18 ENCOUNTER — Other Ambulatory Visit: Payer: Self-pay

## 2020-12-18 DIAGNOSIS — M48061 Spinal stenosis, lumbar region without neurogenic claudication: Secondary | ICD-10-CM

## 2020-12-18 DIAGNOSIS — I10 Essential (primary) hypertension: Secondary | ICD-10-CM

## 2020-12-18 MED ORDER — AMLODIPINE BESYLATE 5 MG PO TABS: 5.0000 mg | ORAL_TABLET | Freq: Every day | ORAL | 0 refills | Status: DC

## 2020-12-18 NOTE — Telephone Encounter (Signed)
Patient called asking if an order could be sent over to [facility] for another epidural. He said that the first time he had one it really seemed to help for a good two weeks but he felt that he over did it while working on a kitchen remodel so he would like to try again.  Please advise.

## 2020-12-19 ENCOUNTER — Encounter: Payer: Self-pay | Admitting: Physical Medicine & Rehabilitation

## 2020-12-19 NOTE — Telephone Encounter (Signed)
Epidural steroid injection ordered.  This is the same that was done in October of last year.  This is designed to mostly help leg pain.  You also had shortly thereafter facet injections which would mostly help back pain.  If you want me to order the facet injections instead please let me know now.  Please call [facility] at [phone] to schedule.

## 2020-12-19 NOTE — Telephone Encounter (Signed)
Left a VM relaying Dr. Clovis Riley message and to call us if he wants the order changed to a facet injection.

## 2020-12-24 DIAGNOSIS — M25562 Pain in left knee: Secondary | ICD-10-CM | POA: Diagnosis not present

## 2020-12-24 DIAGNOSIS — Z96652 Presence of left artificial knee joint: Secondary | ICD-10-CM | POA: Diagnosis not present

## 2020-12-24 DIAGNOSIS — M25662 Stiffness of left knee, not elsewhere classified: Secondary | ICD-10-CM | POA: Diagnosis not present

## 2020-12-24 DIAGNOSIS — R269 Unspecified abnormalities of gait and mobility: Secondary | ICD-10-CM | POA: Diagnosis not present

## 2020-12-26 ENCOUNTER — Ambulatory Visit
Admission: RE | Admit: 2020-12-26 | Discharge: 2020-12-26 | Disposition: A | Discharge status: Home / Self Care | Payer: Medicare HMO | Source: Ambulatory Visit | Attending: Family Medicine | Admitting: Family Medicine

## 2020-12-26 ENCOUNTER — Other Ambulatory Visit: Payer: Self-pay

## 2020-12-26 DIAGNOSIS — M47817 Spondylosis without myelopathy or radiculopathy, lumbosacral region: Secondary | ICD-10-CM | POA: Diagnosis not present

## 2020-12-26 DIAGNOSIS — M48061 Spinal stenosis, lumbar region without neurogenic claudication: Secondary | ICD-10-CM

## 2020-12-26 MED ORDER — IOPAMIDOL (ISOVUE-M 200) INJECTION 41%
1.0000 mL | Freq: Once | INTRAMUSCULAR | Status: AC
  Administered 2020-12-26: 1 mL via EPIDURAL

## 2020-12-26 MED ORDER — METHYLPREDNISOLONE ACETATE 40 MG/ML INJ SUSP (RADIOLOG
80.0000 mg | Freq: Once | INTRAMUSCULAR | Status: AC
  Administered 2020-12-26: 80 mg via EPIDURAL

## 2020-12-26 NOTE — Discharge Instructions (Signed)

## 2020-12-31 ENCOUNTER — Other Ambulatory Visit: Payer: Medicare HMO

## 2021-01-01 ENCOUNTER — Telehealth: Payer: Self-pay | Admitting: Family Medicine

## 2021-01-01 NOTE — Telephone Encounter (Signed)
Patient called to let Dr Georgina Snell know that he had has epidural last week and it has helped tremendously.  Just FYI.

## 2021-01-02 NOTE — Telephone Encounter (Signed)
I called and left voice mail for return call to discuss scheduling surgery.

## 2021-01-04 ENCOUNTER — Telehealth: Payer: Self-pay

## 2021-01-04 NOTE — Telephone Encounter (Signed)
Spoke with patient and planning R TKA for 02/19/21.  He is asking about going to rehab this time after surgery.  I advised I would ask you to call him to discuss.  Thanks!

## 2021-01-04 NOTE — Telephone Encounter (Signed)
Spoke with patient and scheduled surgery,

## 2021-01-07 ENCOUNTER — Other Ambulatory Visit: Payer: Self-pay

## 2021-01-10 ENCOUNTER — Telehealth: Payer: Self-pay

## 2021-01-10 NOTE — Telephone Encounter (Signed)
Patient called he is requesting information regarding rehab,patient also stated he has a new hospital bed,hoyer lift, quadriplegic wheel chair that he would like to donate to someone in need patient is requesting a call [phone]

## 2021-01-18 DIAGNOSIS — R32 Unspecified urinary incontinence: Secondary | ICD-10-CM | POA: Diagnosis not present

## 2021-02-01 ENCOUNTER — Other Ambulatory Visit: Payer: Self-pay

## 2021-02-01 ENCOUNTER — Encounter: Payer: Medicare HMO | Attending: Physical Medicine & Rehabilitation | Admitting: Physical Medicine & Rehabilitation

## 2021-02-01 ENCOUNTER — Encounter: Payer: Self-pay | Admitting: Physical Medicine & Rehabilitation

## 2021-02-01 VITALS — BP 132/79 | HR 68 | Ht 66.0 in | Wt 237.8 lb

## 2021-02-01 DIAGNOSIS — M48062 Spinal stenosis, lumbar region with neurogenic claudication: Secondary | ICD-10-CM | POA: Insufficient documentation

## 2021-02-01 NOTE — Progress Notes (Signed)
Subjective:    Patient ID: Samuel [name], male    DOB: Jan 01, 1956, 65 y.o.   MRN: [id number] Referred by Dr Georgina Snell HPI  CLINICAL DATA:  Low back pain. 65 year old male with a long history of low back pain.  He describes his pain as being sharp and stabbing affecting both sides of the low back upper buttock area going into his posterior thighs.  This pain can occur at any time although he does note that walking may exacerbated.  He can walk without an assistive device he climbs steps he drives.  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  He continues to help load and unload these appliances. The patient exercises 7 days a week by walking. He sleeps about 6 hours/day, sits around 4 hours a day stands about 2 hours a day and walks about 5 hours/day  He has seen primary care as well as sports medicine as well as musculoskeletal radiology for his back issues.  He has had 3 back injections, on 12/01/2019 underwent L3-L4 translaminar injection with good relief for 4 months.  On 04/09/2020 the patient had repeat L3-L4 translaminar injection without good relief.  On 12/26/2020 the patient had repeat L3-L4 translaminar injection with good relief The patient thinks his last injection was the best. Patient previously had tried physical therapy without much relief.  He has had intermittent narcotic analgesics last prescribed on 11/29/2020.  No longer taking EXAM: MRI LUMBAR SPINE WITHOUT CONTRAST   TECHNIQUE: Multiplanar, multisequence MR imaging of the lumbar spine was performed. No intravenous contrast was administered.   COMPARISON:  Plain films May 02, 2019   FINDINGS: Segmentation:  Standard.   Alignment:  Physiologic.   Vertebrae: No fracture, evidence of discitis, or bone lesion. Mild diffuse decrease of the T1 signal throughout the visualized spine, nonspecific, may represent red marrow reconversion.   Conus medullaris and cauda equina: Conus  extends to the L1 level. Conus and cauda equina appear normal.   Paraspinal and other soft tissues: Negative.   Disc levels:   T12-L1: No spinal canal or neural foraminal stenosis.   L1-2: No spinal canal or neural foraminal stenosis.   L2-3: Mild loss of disc height, mild facet degenerative changes with bilateral joint effusion and ligamentum flavum redundancy, resulting in mild spinal canal stenosis and mild bilateral neural foraminal narrowing.   L3-4: Disc bulge with superimposed small left foraminal central disc protrusion abutting the exiting left L3 nerve root. There also facet degenerative change ligamentum flavum redundancy contributing for mild to moderate spinal canal stenosis, narrowing of the subarticular zones and mild bilateral neural foraminal narrowing.   L4-5: Disc bulge, prominent facet degenerative changes ligamentum flavum redundancy resulting in severe spinal canal stenosis and mild bilateral neural foraminal narrowing.   L5-S1: Mild facet degenerative changes. No spinal canal or neural foraminal stenosis.   IMPRESSION: 1. Lumbar spondylosis most pronounced at L4-5 where there is severe spinal canal stenosis and mild bilateral neural foraminal narrowing. 2. At L3-4 there is a small left foraminal central disc protrusion abutting the exiting left L3 nerve root. There is also mild to moderate spinal canal stenosis, narrowing of the subarticular zones and mild bilateral neural foraminal narrowing at this level. 3. Mild spinal canal stenosis and mild bilateral neural foraminal narrowing at L2-3. 4. Mild diffuse decrease of the T1 signal throughout the visualized spine, nonspecific, may represent red marrow reconversion.     Electronically Signed   By:  Pedro Earls M.D.   On: 11/21/2019 09:08   Takes Tylenol or ibuprofen prn but did not need much since last injection  Left  Seen at    Left TKR  Upcoming Right TKR Pain  Inventory Average Pain 10 Pain Right Now 0 My pain is intermittent, sharp, and stabbing  In the last 24 hours, has pain interfered with the following? General activity 0 Relation with others 0 Enjoyment of life 0 What TIME of day is your pain at its worst? morning , daytime, evening, and night Sleep (in general) Fair  Pain is worse with: walking, bending, standing, and some activites Pain improves with: injections Relief from Meds:  no pain medication taken  Mobility Walks without assistance Climbs steps Drives  Function Retired  Neuro/Psych Anxiety   Family History  Problem Relation Age of Onset   Dementia Mother        alcohol   Alcohol abuse Mother    Heart attack Father 36   Heart disease Father 43   COPD Father    Stroke Father    Healthy Daughter    Healthy Son    Healthy Son    CAD Brother    Heart attack Brother    Social History   Socioeconomic History   Marital status: Married    Spouse name: Not on file   Number of children: 3   Years of education: Not on file   Highest education level: Not on file  Occupational History   Occupation: disabled  Tobacco Use   Smoking status: Former    Packs/day: 2.00    Years: 30.00    Pack years: 60.00    Types: Cigarettes    Quit date: 06/16/1998    Years since quitting: 22.6   Smokeless tobacco: Never  Vaping Use   Vaping Use: Never used  Substance and Sexual Activity   Alcohol use: No    Comment: sober 08-28-2017   Drug use: Not Currently   Sexual activity: Yes    Partners: Female  Other Topics Concern   Not on file  Social History Narrative   Not on file   Social Determinants of Health   Financial Resource Strain: Not on file  Food Insecurity: Not on file  Transportation Needs: Not on file  Physical Activity: Not on file  Stress: Not on file  Social Connections: Not on file   Past Surgical History:  Procedure Laterality Date   HERNIA REPAIR     KNEE ARTHROSCOPY Bilateral    LEFT HEART  CATH AND CORONARY ANGIOGRAPHY N/A 06/15/2017   Procedure: LEFT HEART CATH AND CORONARY ANGIOGRAPHY;  Surgeon: Nelva Bush, MD;  Location: [facility];  Service: Cardiovascular;  Laterality: N/A;   TONSILLECTOMY     TOTAL KNEE ARTHROPLASTY Left 10/09/2020   Procedure: LEFT TOTAL KNEE ARTHROPLASTY;  Surgeon: Mcarthur Rossetti, MD;  Location: [facility];  Service: Orthopedics;  Laterality: Left;   UMBILICAL HERNIA REPAIR N/A 11/03/2017   Procedure: UMBILICAL HERNIA REPAIR;  Surgeon: Clovis Riley, MD;  Location: [facility];  Service: General;  Laterality: N/A;   Past Surgical History:  Procedure Laterality Date   HERNIA REPAIR     KNEE ARTHROSCOPY Bilateral    LEFT HEART CATH AND CORONARY ANGIOGRAPHY N/A 06/15/2017   Procedure: LEFT HEART CATH AND CORONARY ANGIOGRAPHY;  Surgeon: Nelva Bush, MD;  Location: [facility];  Service: Cardiovascular;  Laterality: N/A;   TONSILLECTOMY     TOTAL KNEE  ARTHROPLASTY Left 10/09/2020   Procedure: LEFT TOTAL KNEE ARTHROPLASTY;  Surgeon: Mcarthur Rossetti, MD;  Location: [facility];  Service: Orthopedics;  Laterality: Left;   UMBILICAL HERNIA REPAIR N/A 11/03/2017   Procedure: UMBILICAL HERNIA REPAIR;  Surgeon: Clovis Riley, MD;  Location: [facility];  Service: General;  Laterality: N/A;   Past Medical History:  Diagnosis Date   Anxiety    Arthritis    Basal cell carcinoma 04/29/2018   upper mid back, right mid back    Basal cell carcinoma 04/29/2018   right mid back    Basal cell carcinoma 06/10/2018   left lover back    Coronary artery disease    Mild-moderate non-obstructive CAD 06/15/17 (20% dLM, 50% mLAD, 30% OM3, 40% mRCA, 40% dRCA)   COVID-19 unknown   Depression    Former heavy cigarette smoker (20-39 per day) 03/05/2018   Quit 2000; 60 pak year history   HTN (hypertension)    BP 132/79   Pulse 68   Ht '5\' 6"'$  (1.676 m)   Wt 237 lb 12.8 oz (107.9 kg)   SpO2 98%   BMI 38.38 kg/m   Opioid Risk Score:   Fall Risk  Score:  `1  Depression screen PHQ 2/9  Depression screen [facility] 2/9 12/27/2019 10/26/2018 06/11/2017 05/27/2017  Decreased Interest 0 0 0 0  Down, Depressed, Hopeless 0 0 0 0  PHQ - 2 Score 0 0 0 0  Altered sleeping - 0 - -  Tired, decreased energy - 0 - -  Change in appetite - 0 - -  Feeling bad or failure about yourself  - 0 - -  Trouble concentrating - 0 - -  Moving slowly or fidgety/restless - 0 - -  Suicidal thoughts - 0 - -  PHQ-9 Score - 0 - -  Difficult doing work/chores - Not difficult at all - -    Review of Systems  Musculoskeletal:  Positive for back pain and gait problem.  All other systems reviewed and are negative.     Objective:   Physical Exam Vitals and nursing note reviewed.  Constitutional:      Appearance: He is obese.  HENT:     Head: Normocephalic and atraumatic.  Eyes:     Extraocular Movements: Extraocular movements intact.     Conjunctiva/sclera: Conjunctivae normal.     Pupils: Pupils are equal, round, and reactive to light.  Cardiovascular:     Rate and Rhythm: Normal rate and regular rhythm.     Heart sounds: Normal heart sounds. No murmur heard. Pulmonary:     Effort: Pulmonary effort is normal. No respiratory distress.     Breath sounds: Normal breath sounds.  Abdominal:     General: Abdomen is flat. Bowel sounds are normal.     Palpations: Abdomen is soft.  Musculoskeletal:        General: No swelling.     Cervical back: Normal range of motion.     Right lower leg: No edema.     Left lower leg: No edema.     Comments: No tenderness palpation lumbar paraspinal No evidence of scoliosis. No pain with lumbar range of motion, 75% flexion 25% extension 50% lateral bending bilaterally. Hips have decreased range of motion bilaterally with internal and external rotation Left knee was healed surgical incision knee flexion to 100 degrees knee extension 4, right knee full range of motion    FABER's: Positive SI area Distraction (supine):  Negative Thigh thrust test: Negative  Skin:    General: Skin is warm and dry.  Neurological:     Mental Status: He is alert and oriented to person, place, and time.  Psychiatric:        Mood and Affect: Mood normal.        Behavior: Behavior normal.  1.        Assessment & Plan:   1.  Lumbar spinal stenosis with history of neurogenic claudication.  He has done well with at least 2 out of his 3 lumbar epidurals at L3-L4 translaminar.  We discussed that these cannot be done more frequently than every 3-4 months.  We also discussed that his heavy lifting activities may also exacerbate his pain. 2.  Lumbar spondylosis which may be contributing to some of his lumbar pain 3.  The patient has some exam findings consistent with sacroiliac pain.  He also had ultrasound-guided sacroiliac injections performed at the sports medicine physicians office which was helpful during the anesthetic phase. We discussed that at the current time he is doing quite well on his current management plan. If the epidural injections fail to relieve his pain he may benefit from diagnostic/therapeutic lumbar medial branch blocks and/or sacroiliac injections under fluoroscopic guidance. I will see him back on an as-needed basis.

## 2021-02-01 NOTE — Patient Instructions (Signed)
Sounds like lumbar epidural injections at L3-4 are working well , I can re evaluate you if these become less effective with time

## 2021-02-07 ENCOUNTER — Telehealth: Payer: Self-pay | Admitting: Orthopaedic Surgery

## 2021-02-07 NOTE — Telephone Encounter (Signed)
Pt called requesting a call back concerning his approval for his surgery scheduled 02/02/21. Pt also has a few more questions about his pro op appt. Please call pt at [phone].

## 2021-02-13 NOTE — Telephone Encounter (Signed)
I called patient and answered questions about pre-op being scheduled.  Samuel [name] - He is a bundle.  He wants an ice machine.  He is scheduled for 03/05/21.  Thanks!

## 2021-02-15 DIAGNOSIS — R32 Unspecified urinary incontinence: Secondary | ICD-10-CM | POA: Diagnosis not present

## 2021-02-15 NOTE — Pre-Procedure Instructions (Signed)
Surgical Instructions   Your procedure is scheduled on Tuesday, September 20th. Report to [facility] Main Entrance "A" at 10:00 A.M., then check in with the Admitting office. Call this number if you have problems the morning of surgery: [phone]   If you have any questions prior to your surgery date call [phone]: Open Monday-Friday 8am-4pm   Remember: Do not eat after midnight the night before your surgery  You may drink clear liquids until 09:00 AM the morning of your surgery.   Clear liquids allowed are: Water, Non-Citrus Juices (without pulp), Carbonated Beverages, Clear Tea, Black Coffee ONLY (NO MILK, CREAM OR POWDERED CREAMER of any kind), and Gatorade   Take these medicines the morning of surgery with A SIP OF WATER  amLODipine (NORVASC) FLUoxetine (PROZAC)   If needed: acetaminophen (TYLENOL)   As of today, STOP taking any Aspirin (unless otherwise instructed by your surgeon) Aleve, Naproxen, Ibuprofen, Motrin, Advil, Goody's, BC's, all herbal medications, fish oil, and all vitamins.          Do not wear jewelry  Do not wear lotions, powders, perfumes, or deodorant. Men may shave face and neck. Do not bring valuables to the hospital. [facility] is not responsible for any belongings or valuables.               Do NOT Smoke (Tobacco/Vaping)  24 hours prior to your procedure If you use a CPAP at night, you may bring all equipment for your overnight stay.   Contacts, glasses, dentures or bridgework may not be worn into surgery, please bring cases for these belongings   For patients admitted to the hospital, discharge time will be determined by your treatment team.   Patients discharged the day of surgery will not be allowed to drive home, and someone needs to stay with them for 24 hours.  ONLY 1 SUPPORT PERSON MAY BE PRESENT WHILE YOU ARE IN SURGERY. IF YOU ARE TO BE ADMITTED ONCE YOU ARE IN YOUR ROOM YOU WILL BE ALLOWED TWO (2) VISITORS.  Minor children may have  two parents present. Special consideration for safety and communication needs will be reviewed on a case by case basis.  Special instructions:    Oral Hygiene is also important to reduce your risk of infection.  Remember - BRUSH YOUR TEETH THE MORNING OF SURGERY WITH YOUR REGULAR TOOTHPASTE   Samuel [name]- Preparing For Surgery  Before surgery, you can play an important role. Because skin is not sterile, your skin needs to be as free of germs as possible. You can reduce the number of germs on your skin by washing with CHG (chlorahexidine gluconate) Soap before surgery.  CHG is an antiseptic cleaner which kills germs and bonds with the skin to continue killing germs even after washing.     Please do not use if you have an allergy to CHG or antibacterial soaps. If your skin becomes reddened/irritated stop using the CHG.  Do not shave (including legs and underarms) for at least 48 hours prior to first CHG shower. It is OK to shave your face.  Please follow these instructions carefully.     Shower the NIGHT BEFORE SURGERY and the MORNING OF SURGERY with CHG Soap.   If you chose to wash your hair, wash your hair first as usual with your normal shampoo. After you shampoo, rinse your hair and body thoroughly to remove the shampoo.  Then [employer] and genitals (private parts) with your normal soap and rinse thoroughly to remove  soap.  After that Use CHG Soap as you would any other liquid soap. You can apply CHG directly to the skin and wash gently with a scrungie or a clean washcloth.   Apply the CHG Soap to your body ONLY FROM THE NECK DOWN.  Do not use on open wounds or open sores. Avoid contact with your eyes, ears, mouth and genitals (private parts). Wash Face and genitals (private parts)  with your normal soap.   Wash thoroughly, paying special attention to the area where your surgery will be performed.  Thoroughly rinse your body with warm water from the neck down.  DO NOT shower/wash with  your normal soap after using and rinsing off the CHG Soap.  Pat yourself dry with a CLEAN TOWEL.  Wear CLEAN PAJAMAS to bed the night before surgery  Place CLEAN SHEETS on your bed the night before your surgery  DO NOT SLEEP WITH PETS.   Day of Surgery:  Take a shower with CHG soap. Wear Clean/Comfortable clothing the morning of surgery Do not apply any deodorants/lotions.   Remember to brush your teeth WITH YOUR REGULAR TOOTHPASTE.   Please read over the following fact sheets that you were given.

## 2021-02-19 ENCOUNTER — Other Ambulatory Visit: Payer: Self-pay

## 2021-02-19 ENCOUNTER — Encounter (HOSPITAL_COMMUNITY): Payer: Self-pay

## 2021-02-19 ENCOUNTER — Encounter (HOSPITAL_COMMUNITY)
Admission: RE | Admit: 2021-02-19 | Discharge: 2021-02-19 | Disposition: A | Discharge status: Home / Self Care | Payer: Medicare HMO | Source: Ambulatory Visit | Attending: Orthopaedic Surgery | Admitting: Orthopaedic Surgery

## 2021-02-19 DIAGNOSIS — M1711 Unilateral primary osteoarthritis, right knee: Secondary | ICD-10-CM | POA: Insufficient documentation

## 2021-02-19 DIAGNOSIS — Z7901 Long term (current) use of anticoagulants: Secondary | ICD-10-CM | POA: Diagnosis not present

## 2021-02-19 DIAGNOSIS — I10 Essential (primary) hypertension: Secondary | ICD-10-CM | POA: Insufficient documentation

## 2021-02-19 DIAGNOSIS — I2510 Atherosclerotic heart disease of native coronary artery without angina pectoris: Secondary | ICD-10-CM | POA: Diagnosis not present

## 2021-02-19 DIAGNOSIS — Z01812 Encounter for preprocedural laboratory examination: Secondary | ICD-10-CM | POA: Insufficient documentation

## 2021-02-19 HISTORY — DX: Other psychoactive substance abuse, uncomplicated: F19.10

## 2021-02-19 HISTORY — DX: Personal history of other diseases of the digestive system: Z87.19

## 2021-02-19 HISTORY — DX: Bronchitis, not specified as acute or chronic: J40

## 2021-02-19 LAB — BASIC METABOLIC PANEL
Anion gap: 7 (ref 5–15)
BUN: 10 mg/dL (ref 8–23)
CO2: 29 mmol/L (ref 22–32)
Calcium: 9.3 mg/dL (ref 8.9–10.3)
Chloride: 101 mmol/L (ref 98–111)
Creatinine, Ser: 0.87 mg/dL (ref 0.61–1.24)
GFR, Estimated: >60 mL/min (ref >60)
Glucose, Bld: 99 mg/dL (ref 70–99)
Potassium: 4.2 mmol/L (ref 3.5–5.1)
Sodium: 137 mmol/L (ref 135–145)

## 2021-02-19 LAB — CBC
HCT: 46.1 % (ref 39.0–52.0)
Hemoglobin: 14.9 g/dL (ref 13.0–17.0)
MCH: 28.4 pg (ref 26.0–34.0)
MCHC: 32.3 g/dL (ref 30.0–36.0)
MCV: 88 fL (ref 80.0–100.0)
Platelets: 277 10*3/uL (ref 150–400)
RBC: 5.24 MIL/uL (ref 4.22–5.81)
RDW: 14.1 % (ref 11.5–15.5)
WBC: 8.3 10*3/uL (ref 4.0–10.5)
nRBC: 0 % (ref 0.0–0.2)

## 2021-02-19 LAB — SURGICAL PCR SCREEN
MRSA, PCR: NEGATIVE
Staphylococcus aureus: NEGATIVE

## 2021-02-19 NOTE — Progress Notes (Signed)
PCP - Eunice Blase, MD Cardiologist - per pt, he saw one years ago, but has not needed to follow up since, does not remember who he saw.  PPM/ICD - Denies  Chest x-ray - N/A EKG - 10/05/20 Stress Test - 06/10/2017 ECHO - 06/10/2017 Cardiac Cath - 06/15/2017  Sleep Study - Denies  DM- Denies  Blood Thinner Instructions: N/A Aspirin Instructions: N/A  ERAS Protcol - Yes PRE-SURGERY Ensure or G2- Not ordered  COVID TEST- 03/04/21   Anesthesia review: Yes, cardiac hx.  Patient denies shortness of breath, fever, cough and chest pain at PAT appointment   All instructions explained to the patient, with a verbal understanding of the material. Patient agrees to go over the instructions while at home for a better understanding. The opportunity to ask questions was provided.

## 2021-02-20 ENCOUNTER — Other Ambulatory Visit: Payer: Self-pay | Admitting: Physician Assistant

## 2021-02-20 NOTE — Progress Notes (Signed)
Anesthesia Chart Review:   Case: [id number] Date/Time: 03/05/21 1145   Procedure: RIGHT TOTAL KNEE ARTHROPLASTY (Right: Knee)   Anesthesia type: Spinal   Pre-op diagnosis: osteoarthritis right knee   Location: [facility] OR ROOM 04 / [place] OR   Surgeons: Mcarthur Rossetti, MD       DISCUSSION:   Patient is a 65 year old male with history CAD (mild-moderate non-obstructive CAD 06/15/17), HTN  S/p L TKA 10/09/20   VS: BP (!) 155/85   Pulse 77   Temp 36.9 C (Oral)   Resp 18   Ht '5\' 8"'$  (1.727 m)   Wt 108.3 kg   SpO2 99%   BMI 36.31 kg/m   PROVIDERS: - PCP is Eunice Blase, MD - Cardiologist is Mertie Moores, MD. Last visit 07/20/17. Work-up in late 2018 had been done for increased SOB. Myoview abnormal, so LHC done and showed mild-moderate CAD with "distal branches are small and diffusely diseased". Dyspnea felt due to obesity and deconditioning. Patient had lost 11 lbs and was doing better. He wrote, "He will follow up with his primary MD" with as needed cardiology follow-up.  LABS: Labs reviewed: Acceptable for surgery. (all labs ordered are listed, but only abnormal results are displayed)  Labs Reviewed  SURGICAL PCR SCREEN  BASIC METABOLIC PANEL  CBC    EKG 10/05/20: NSR   CV: Cardiac cath 06/15/17 (done following abnormal stress test 06/10/17): LM: Distal LM 20%. LAD: Moderate size. Distal LAD is a small vessel with tapering before the apex. Mid LAD 50%. D1 is small D2 is moderate size. LCX: OM1 and OM2 are small in size. OM3 is moderate in size with 30% stenosis.  RCA: Mid RCA 40%. Distal RCA 40%. Conclusions: Mild to moderate, non-obstructive coronary artery disease. Distal branches are small and diffusely diseased. Normal left ventricular contraction. Mildly elevated left ventricular filling pressure. Recommendations: Medical therapy and risk factor modification to prevent progression of coronary artery disease. Continue medical therapy for component of diastolic  dysfunction.      Echo 06/10/17: Study Conclusions  - Left ventricle: The cavity size was normal. There was moderate   focal basal hypertrophy of the septum. Systolic function was   normal. The estimated ejection fraction was in the range of 50%   to 55%. There is hypokinesis of the apical myocardium. There is   hypokinesis of the apical anterior apical septal and inferior   myocardium.  - Mitral valve: Calcified annulus.  - Left atrium: The atrium was mildly dilated.     Past Medical History:  Diagnosis Date   Anxiety    Arthritis    Basal cell carcinoma 04/29/2018   upper mid back, right mid back    Basal cell carcinoma 04/29/2018   right mid back    Basal cell carcinoma 06/10/2018   left lover back    Bronchitis    Coronary artery disease    Mild-moderate non-obstructive CAD 06/15/17 (20% dLM, 50% mLAD, 30% OM3, 40% mRCA, 40% dRCA)   COVID-19 unknown   Depression    Former heavy cigarette smoker (20-39 per day) 03/05/2018   Quit 2000; 60 pak year history   History of hiatal hernia    HTN (hypertension)    Substance abuse ([place])    sober for 3 years    Past Surgical History:  Procedure Laterality Date   HERNIA REPAIR     KNEE ARTHROSCOPY Bilateral    LEFT HEART CATH AND CORONARY ANGIOGRAPHY N/A 06/15/2017   Procedure: LEFT HEART CATH AND  CORONARY ANGIOGRAPHY;  Surgeon: Nelva Bush, MD;  Location: [place] [facility];  Service: Cardiovascular;  Laterality: N/A;   TONSILLECTOMY     TOTAL KNEE ARTHROPLASTY Left 10/09/2020   Procedure: LEFT TOTAL KNEE ARTHROPLASTY;  Surgeon: Mcarthur Rossetti, MD;  Location: [place];  Service: Orthopedics;  Laterality: Left;   UMBILICAL HERNIA REPAIR N/A 11/03/2017   Procedure: UMBILICAL HERNIA REPAIR;  Surgeon: Clovis Riley, MD;  Location: [facility];  Service: General;  Laterality: N/A;    MEDICATIONS:  acetaminophen (TYLENOL) 500 MG tablet   amLODipine (NORVASC) 5 MG tablet   FLUoxetine (PROZAC) 20 MG capsule   VITAMIN D PO    No current facility-administered medications for this encounter.    If no changes, I anticipate pt can proceed with surgery as scheduled.   Willeen Cass, PhD, FNP-BC [facility] Short Stay [facility]/Anesthesiology Phone: [phone] 02/20/2021 2:23 PM

## 2021-02-20 NOTE — Anesthesia Preprocedure Evaluation (Addendum)
Anesthesia Evaluation  Patient identified by MRN, date of birth, ID band Patient awake    Reviewed: Allergy & Precautions, NPO status , Patient's Chart, lab work & pertinent test results, reviewed documented beta blocker date and time   Airway Mallampati: III  TM Distance: >3 FB Neck ROM: Full    Dental no notable dental hx. (+) Upper Dentures, Edentulous Lower   Pulmonary former smoker,    Pulmonary exam normal breath sounds clear to auscultation- rhonchi       Cardiovascular hypertension, Pt. on medications + CAD  Normal cardiovascular exam Rhythm:Regular Rate:Normal     Neuro/Psych PSYCHIATRIC DISORDERS Anxiety Depression negative neurological ROS     GI/Hepatic Neg liver ROS, hiatal hernia,   Endo/Other  Hypothyroidism Obesity  Renal/GU negative Renal ROS  negative genitourinary   Musculoskeletal  (+) Arthritis , Osteoarthritis,  OA right knee   Abdominal (+) + obese,   Peds  Hematology negative hematology ROS (+)   Anesthesia Other Findings   Reproductive/Obstetrics                            Anesthesia Physical Anesthesia Plan  ASA: 3  Anesthesia Plan: Spinal   Post-op Pain Management:  Regional for Post-op pain   Induction: Intravenous  PONV Risk Score and Plan: 2 and Treatment may vary due to age or medical condition, Ondansetron and Propofol infusion  Airway Management Planned: Natural Airway and Simple Face Mask  Additional Equipment:   Intra-op Plan:   Post-operative Plan:   Informed Consent: I have reviewed the patients History and Physical, chart, labs and discussed the procedure including the risks, benefits and alternatives for the proposed anesthesia with the patient or authorized representative who has indicated his/her understanding and acceptance.     Dental advisory given  Plan Discussed with: CRNA and Anesthesiologist  Anesthesia Plan Comments: (See  APP note by Durel Salts, FNP )       Anesthesia Quick Evaluation                                  Anesthesia Evaluation  Patient identified by MRN, date of birth, ID band Patient awake    Reviewed: Allergy & Precautions, NPO status , Patient's Chart, lab work & pertinent test results  History of Anesthesia Complications Negative for: history of anesthetic complications  Airway Mallampati: II  TM Distance: >3 FB Neck ROM: Full    Dental   Pulmonary neg pulmonary ROS, former smoker,    Pulmonary exam normal        Cardiovascular hypertension, Pt. on medications + CAD (nonobstructive by cath)  Normal cardiovascular exam     Neuro/Psych Anxiety Depression negative neurological ROS     GI/Hepatic negative GI ROS, Neg liver ROS,   Endo/Other  negative endocrine ROS  Renal/GU negative Renal ROS  negative genitourinary   Musculoskeletal  (+) Arthritis ,   Abdominal   Peds  Hematology negative hematology ROS (+)   Anesthesia Other Findings  Echo 06/10/17: Study Conclusions  - Left ventricle: The cavity size was normal. There was moderate  focal basal hypertrophy of the septum. Systolic function was  normal. The estimated ejection fraction was in the range of 50%  to 55%. There is hypokinesis of the apical myocardium. There is  hypokinesis of the apical anterior apical septal and inferior  myocardium.  - Mitral valve: Calcified annulus.  -  Left atrium: The atrium was mildly dilated.   Cath 06/15/17: Conclusions: 1. Mild to moderate, non-obstructive coronary artery disease. Distal branches are small and diffusely diseased. 2. Normal left ventricular contraction. 3. Mildly elevated left ventricular filling pressure.   Reproductive/Obstetrics                           Anesthesia Physical Anesthesia Plan  ASA: II  Anesthesia Plan: Spinal   Post-op Pain Management:    Induction:   PONV Risk Score and Plan: 1  and Propofol infusion, Treatment may vary due to age or medical condition, Ondansetron and TIVA  Airway Management Planned: Nasal Cannula and Simple Face Mask  Additional Equipment: None  Intra-op Plan:   Post-operative Plan:   Informed Consent: I have reviewed the patients History and Physical, chart, labs and discussed the procedure including the risks, benefits and alternatives for the proposed anesthesia with the patient or authorized representative who has indicated his/her understanding and acceptance.       Plan Discussed with:   Anesthesia Plan Comments: (PAT note written 10/08/2020 by Myra Gianotti, PA-C. )      Anesthesia Quick Evaluation

## 2021-03-04 ENCOUNTER — Telehealth: Payer: Self-pay | Admitting: *Deleted

## 2021-03-04 ENCOUNTER — Other Ambulatory Visit (HOSPITAL_COMMUNITY)
Admission: RE | Admit: 2021-03-04 | Discharge: 2021-03-04 | Disposition: A | Discharge status: Home / Self Care | Payer: Medicare HMO | Source: Ambulatory Visit | Attending: Orthopaedic Surgery | Admitting: Orthopaedic Surgery

## 2021-03-04 DIAGNOSIS — Z20822 Contact with and (suspected) exposure to covid-19: Secondary | ICD-10-CM | POA: Insufficient documentation

## 2021-03-04 DIAGNOSIS — Z01812 Encounter for preprocedural laboratory examination: Secondary | ICD-10-CM | POA: Insufficient documentation

## 2021-03-04 LAB — SARS CORONAVIRUS 2 (TAT 6-24 HRS): SARS Coronavirus 2: NEGATIVE

## 2021-03-04 NOTE — Care Plan (Signed)
[facility] RNCM call to patient to discuss his Right total knee replacement with Dr. Ninfa Linden. He is an Ortho bundle patient through [facility] and is agreeable to case management. He lives with his wife and plans to return home after short hospital stay. He has all DME (RW, 3in1/BSC). He really wants to make sure he receives a "ice man" ice wrap for the knee during his stay. Referral for HHPT made to [facility] prior to surgery after choice provided. He would like to attend OPPT at [facility] in [place] again, where he was seen after his left total knee replacement. Reviewed post op care instructions. Will continue to follow for needs.

## 2021-03-04 NOTE — Telephone Encounter (Signed)
Ortho bundle 90 day call completed. 

## 2021-03-04 NOTE — Telephone Encounter (Signed)
Ortho bundle pre-op call completed for upcoming Right total knee replacement.

## 2021-03-05 ENCOUNTER — Encounter: Payer: Medicare HMO | Admitting: Orthopaedic Surgery

## 2021-03-05 ENCOUNTER — Observation Stay (HOSPITAL_COMMUNITY)
Admission: RE | Admit: 2021-03-05 | Discharge: 2021-03-07 | Disposition: A | Discharge status: Home / Self Care | Payer: Medicare HMO | Source: Ambulatory Visit | Attending: Orthopaedic Surgery | Admitting: Orthopaedic Surgery

## 2021-03-05 ENCOUNTER — Other Ambulatory Visit: Payer: Self-pay

## 2021-03-05 ENCOUNTER — Encounter (HOSPITAL_COMMUNITY): Payer: Self-pay | Admitting: Orthopaedic Surgery

## 2021-03-05 ENCOUNTER — Ambulatory Visit (HOSPITAL_COMMUNITY): Payer: Medicare HMO | Admitting: Emergency Medicine

## 2021-03-05 ENCOUNTER — Ambulatory Visit (HOSPITAL_COMMUNITY): Payer: Medicare HMO | Admitting: Anesthesiology

## 2021-03-05 ENCOUNTER — Observation Stay (HOSPITAL_COMMUNITY): Payer: Medicare HMO

## 2021-03-05 ENCOUNTER — Encounter (HOSPITAL_COMMUNITY)
Admission: RE | Disposition: A | Discharge status: Home / Self Care | Payer: Self-pay | Source: Ambulatory Visit | Attending: Orthopaedic Surgery

## 2021-03-05 DIAGNOSIS — M65861 Other synovitis and tenosynovitis, right lower leg: Secondary | ICD-10-CM | POA: Diagnosis not present

## 2021-03-05 DIAGNOSIS — M25761 Osteophyte, right knee: Secondary | ICD-10-CM | POA: Diagnosis not present

## 2021-03-05 DIAGNOSIS — M1711 Unilateral primary osteoarthritis, right knee: Principal | ICD-10-CM | POA: Insufficient documentation

## 2021-03-05 DIAGNOSIS — I10 Essential (primary) hypertension: Secondary | ICD-10-CM | POA: Diagnosis not present

## 2021-03-05 DIAGNOSIS — Z85828 Personal history of other malignant neoplasm of skin: Secondary | ICD-10-CM | POA: Diagnosis not present

## 2021-03-05 DIAGNOSIS — Z96652 Presence of left artificial knee joint: Secondary | ICD-10-CM | POA: Insufficient documentation

## 2021-03-05 DIAGNOSIS — Z79899 Other long term (current) drug therapy: Secondary | ICD-10-CM | POA: Insufficient documentation

## 2021-03-05 DIAGNOSIS — Z96651 Presence of right artificial knee joint: Secondary | ICD-10-CM | POA: Diagnosis not present

## 2021-03-05 DIAGNOSIS — I2510 Atherosclerotic heart disease of native coronary artery without angina pectoris: Secondary | ICD-10-CM | POA: Diagnosis not present

## 2021-03-05 DIAGNOSIS — Z87891 Personal history of nicotine dependence: Secondary | ICD-10-CM | POA: Insufficient documentation

## 2021-03-05 DIAGNOSIS — Z8616 Personal history of COVID-19: Secondary | ICD-10-CM | POA: Insufficient documentation

## 2021-03-05 DIAGNOSIS — M25461 Effusion, right knee: Secondary | ICD-10-CM | POA: Diagnosis not present

## 2021-03-05 DIAGNOSIS — G8918 Other acute postprocedural pain: Secondary | ICD-10-CM | POA: Diagnosis not present

## 2021-03-05 DIAGNOSIS — Z471 Aftercare following joint replacement surgery: Secondary | ICD-10-CM | POA: Diagnosis not present

## 2021-03-05 HISTORY — PX: TOTAL KNEE ARTHROPLASTY: SHX125

## 2021-03-05 LAB — TYPE AND SCREEN
ABO/RH(D): O POS
Antibody Screen: NEGATIVE

## 2021-03-05 SURGERY — ARTHROPLASTY, KNEE, TOTAL
Anesthesia: Spinal | Site: Knee | Laterality: Right

## 2021-03-05 MED ORDER — FENTANYL CITRATE (PF) 100 MCG/2ML IJ SOLN: 100.0000 ug | Freq: Once | INTRAMUSCULAR | Status: AC

## 2021-03-05 MED ORDER — OXYCODONE HCL 5 MG PO TABS
10.0000 mg | ORAL_TABLET | ORAL | Status: DC | PRN
  Administered 2021-03-05 – 2021-03-07 (×8): 15 mg via ORAL
  Filled 2021-03-05 (×8): qty 3

## 2021-03-05 MED ORDER — FENTANYL CITRATE (PF) 100 MCG/2ML IJ SOLN
INTRAMUSCULAR | Status: AC
  Administered 2021-03-05: 100 ug via INTRAVENOUS
  Filled 2021-03-05: qty 2

## 2021-03-05 MED ORDER — 0.9 % SODIUM CHLORIDE (POUR BTL) OPTIME
TOPICAL | Status: DC | PRN
  Administered 2021-03-05: 1000 mL

## 2021-03-05 MED ORDER — ASPIRIN 81 MG PO CHEW
81.0000 mg | CHEWABLE_TABLET | Freq: Two times a day (BID) | ORAL | Status: DC
  Administered 2021-03-05 – 2021-03-07 (×4): 81 mg via ORAL
  Filled 2021-03-05 (×4): qty 1

## 2021-03-05 MED ORDER — OXYCODONE HCL 5 MG/5ML PO SOLN: 5.0000 mg | Freq: Once | ORAL | Status: AC | PRN

## 2021-03-05 MED ORDER — METHOCARBAMOL 500 MG PO TABS
500.0000 mg | ORAL_TABLET | Freq: Four times a day (QID) | ORAL | Status: DC | PRN
  Administered 2021-03-06 – 2021-03-07 (×3): 500 mg via ORAL
  Filled 2021-03-05 (×3): qty 1

## 2021-03-05 MED ORDER — METHOCARBAMOL 1000 MG/10ML IJ SOLN
500.0000 mg | Freq: Four times a day (QID) | INTRAVENOUS | Status: DC | PRN
  Filled 2021-03-05: qty 5

## 2021-03-05 MED ORDER — ALUM & MAG HYDROXIDE-SIMETH 200-200-20 MG/5ML PO SUSP: 30.0000 mL | ORAL | Status: DC | PRN

## 2021-03-05 MED ORDER — ONDANSETRON HCL 4 MG/2ML IJ SOLN: 4.0000 mg | Freq: Once | INTRAMUSCULAR | Status: DC | PRN

## 2021-03-05 MED ORDER — ONDANSETRON HCL 4 MG PO TABS: 4.0000 mg | ORAL_TABLET | Freq: Four times a day (QID) | ORAL | Status: DC | PRN

## 2021-03-05 MED ORDER — MENTHOL 3 MG MT LOZG: 1.0000 | LOZENGE | OROMUCOSAL | Status: DC | PRN

## 2021-03-05 MED ORDER — PANTOPRAZOLE SODIUM 40 MG PO TBEC
40.0000 mg | DELAYED_RELEASE_TABLET | Freq: Every day | ORAL | Status: DC
  Administered 2021-03-06 – 2021-03-07 (×2): 40 mg via ORAL
  Filled 2021-03-05 (×2): qty 1

## 2021-03-05 MED ORDER — DIPHENHYDRAMINE HCL 50 MG/ML IJ SOLN
6.2500 mg | Freq: Once | INTRAMUSCULAR | Status: AC
  Administered 2021-03-05: 6.25 mg via INTRAVENOUS

## 2021-03-05 MED ORDER — MIDAZOLAM HCL 2 MG/2ML IJ SOLN: 2.0000 mg | Freq: Once | INTRAMUSCULAR | Status: AC

## 2021-03-05 MED ORDER — DOCUSATE SODIUM 100 MG PO CAPS
100.0000 mg | ORAL_CAPSULE | Freq: Two times a day (BID) | ORAL | Status: DC
  Administered 2021-03-05 – 2021-03-07 (×4): 100 mg via ORAL
  Filled 2021-03-05 (×4): qty 1

## 2021-03-05 MED ORDER — DIPHENHYDRAMINE HCL 50 MG/ML IJ SOLN
INTRAMUSCULAR | Status: AC
  Filled 2021-03-05: qty 1

## 2021-03-05 MED ORDER — EPHEDRINE SULFATE-NACL 50-0.9 MG/10ML-% IV SOSY
PREFILLED_SYRINGE | INTRAVENOUS | Status: DC | PRN
  Administered 2021-03-05: 5 mg via INTRAVENOUS
  Administered 2021-03-05 (×2): 10 mg via INTRAVENOUS

## 2021-03-05 MED ORDER — CHLORHEXIDINE GLUCONATE 0.12 % MT SOLN
15.0000 mL | Freq: Once | OROMUCOSAL | Status: AC
  Administered 2021-03-05: 15 mL via OROMUCOSAL
  Filled 2021-03-05: qty 15

## 2021-03-05 MED ORDER — PHENYLEPHRINE HCL-NACL 20-0.9 MG/250ML-% IV SOLN
INTRAVENOUS | Status: DC | PRN
  Administered 2021-03-05: 20 ug/min via INTRAVENOUS

## 2021-03-05 MED ORDER — CEFAZOLIN SODIUM-DEXTROSE 2-4 GM/100ML-% IV SOLN
2.0000 g | INTRAVENOUS | Status: AC
  Administered 2021-03-05: 2 g via INTRAVENOUS
  Filled 2021-03-05: qty 100

## 2021-03-05 MED ORDER — MIDAZOLAM HCL 2 MG/2ML IJ SOLN
INTRAMUSCULAR | Status: AC
  Administered 2021-03-05: 2 mg via INTRAVENOUS
  Filled 2021-03-05: qty 2

## 2021-03-05 MED ORDER — ONDANSETRON HCL 4 MG/2ML IJ SOLN: 4.0000 mg | Freq: Four times a day (QID) | INTRAMUSCULAR | Status: DC | PRN

## 2021-03-05 MED ORDER — KETOROLAC TROMETHAMINE 15 MG/ML IJ SOLN
15.0000 mg | Freq: Four times a day (QID) | INTRAMUSCULAR | Status: AC
  Administered 2021-03-05 – 2021-03-06 (×3): 15 mg via INTRAVENOUS
  Filled 2021-03-05 (×3): qty 1

## 2021-03-05 MED ORDER — PHENOL 1.4 % MT LIQD: 1.0000 | OROMUCOSAL | Status: DC | PRN

## 2021-03-05 MED ORDER — DIPHENHYDRAMINE HCL 12.5 MG/5ML PO ELIX
12.5000 mg | ORAL_SOLUTION | ORAL | Status: DC | PRN
  Administered 2021-03-05 – 2021-03-07 (×4): 25 mg via ORAL
  Filled 2021-03-05 (×4): qty 10

## 2021-03-05 MED ORDER — POVIDONE-IODINE 10 % EX SWAB
2.0000 "application " | Freq: Once | CUTANEOUS | Status: AC
  Administered 2021-03-05: 2 via TOPICAL

## 2021-03-05 MED ORDER — PHENYLEPHRINE HCL (PRESSORS) 10 MG/ML IV SOLN
INTRAVENOUS | Status: AC
  Filled 2021-03-05: qty 2

## 2021-03-05 MED ORDER — METOCLOPRAMIDE HCL 5 MG/ML IJ SOLN: 5.0000 mg | Freq: Three times a day (TID) | INTRAMUSCULAR | Status: DC | PRN

## 2021-03-05 MED ORDER — LIDOCAINE 2% (20 MG/ML) 5 ML SYRINGE
INTRAMUSCULAR | Status: AC
  Filled 2021-03-05: qty 5

## 2021-03-05 MED ORDER — SODIUM CHLORIDE 0.9 % IR SOLN
Status: DC | PRN
  Administered 2021-03-05: 3000 mL

## 2021-03-05 MED ORDER — OXYCODONE HCL 5 MG PO TABS
5.0000 mg | ORAL_TABLET | ORAL | Status: DC | PRN
  Administered 2021-03-06: 10 mg via ORAL
  Filled 2021-03-05: qty 2

## 2021-03-05 MED ORDER — OXYCODONE HCL 5 MG PO TABS
5.0000 mg | ORAL_TABLET | Freq: Once | ORAL | Status: AC | PRN
Start: 2021-03-05 — End: 2021-03-05
  Administered 2021-03-05: 5 mg via ORAL

## 2021-03-05 MED ORDER — FLUOXETINE HCL 20 MG PO CAPS
20.0000 mg | ORAL_CAPSULE | Freq: Every day | ORAL | Status: DC
  Administered 2021-03-06 – 2021-03-07 (×2): 20 mg via ORAL
  Filled 2021-03-05 (×2): qty 1

## 2021-03-05 MED ORDER — ACETAMINOPHEN 325 MG PO TABS
325.0000 mg | ORAL_TABLET | Freq: Four times a day (QID) | ORAL | Status: DC | PRN
  Administered 2021-03-06: 650 mg via ORAL
  Filled 2021-03-05: qty 2

## 2021-03-05 MED ORDER — PROPOFOL 10 MG/ML IV BOLUS
INTRAVENOUS | Status: DC | PRN
  Administered 2021-03-05: 10 mg via INTRAVENOUS
  Administered 2021-03-05: 20 mg via INTRAVENOUS
  Administered 2021-03-05: 30 mg via INTRAVENOUS

## 2021-03-05 MED ORDER — FENTANYL CITRATE (PF) 100 MCG/2ML IJ SOLN
INTRAMUSCULAR | Status: AC
  Filled 2021-03-05: qty 2

## 2021-03-05 MED ORDER — BUPIVACAINE IN DEXTROSE 0.75-8.25 % IT SOLN
INTRATHECAL | Status: DC | PRN
  Administered 2021-03-05: 1.6 mL via INTRATHECAL

## 2021-03-05 MED ORDER — LACTATED RINGERS IV SOLN: INTRAVENOUS | Status: DC

## 2021-03-05 MED ORDER — METOCLOPRAMIDE HCL 5 MG PO TABS: 5.0000 mg | ORAL_TABLET | Freq: Three times a day (TID) | ORAL | Status: DC | PRN

## 2021-03-05 MED ORDER — TRANEXAMIC ACID-NACL 1000-0.7 MG/100ML-% IV SOLN
1000.0000 mg | INTRAVENOUS | Status: AC
  Administered 2021-03-05: 1000 mg via INTRAVENOUS
  Filled 2021-03-05: qty 100

## 2021-03-05 MED ORDER — LIDOCAINE 2% (20 MG/ML) 5 ML SYRINGE
INTRAMUSCULAR | Status: DC | PRN
  Administered 2021-03-05: 40 mg via INTRAVENOUS

## 2021-03-05 MED ORDER — ORAL CARE MOUTH RINSE: 15.0000 mL | Freq: Once | OROMUCOSAL | Status: AC

## 2021-03-05 MED ORDER — POLYETHYLENE GLYCOL 3350 17 G PO PACK: 17.0000 g | PACK | Freq: Every day | ORAL | Status: DC | PRN

## 2021-03-05 MED ORDER — HYDROMORPHONE HCL 1 MG/ML IJ SOLN
0.5000 mg | INTRAMUSCULAR | Status: DC | PRN
  Administered 2021-03-05 – 2021-03-07 (×5): 1 mg via INTRAVENOUS
  Filled 2021-03-05 (×5): qty 1

## 2021-03-05 MED ORDER — CEFAZOLIN SODIUM-DEXTROSE 1-4 GM/50ML-% IV SOLN
1.0000 g | Freq: Four times a day (QID) | INTRAVENOUS | Status: AC
  Administered 2021-03-05 – 2021-03-06 (×2): 1 g via INTRAVENOUS
  Filled 2021-03-05 (×2): qty 50

## 2021-03-05 MED ORDER — AMLODIPINE BESYLATE 5 MG PO TABS
5.0000 mg | ORAL_TABLET | Freq: Every day | ORAL | Status: DC
  Administered 2021-03-06 – 2021-03-07 (×2): 5 mg via ORAL
  Filled 2021-03-05 (×2): qty 1

## 2021-03-05 MED ORDER — FENTANYL CITRATE (PF) 100 MCG/2ML IJ SOLN
25.0000 ug | INTRAMUSCULAR | Status: DC | PRN
  Administered 2021-03-05 (×2): 50 ug via INTRAVENOUS

## 2021-03-05 MED ORDER — PROPOFOL 500 MG/50ML IV EMUL
INTRAVENOUS | Status: DC | PRN
  Administered 2021-03-05: 10 ug/kg/min via INTRAVENOUS

## 2021-03-05 MED ORDER — OXYCODONE HCL 5 MG PO TABS
ORAL_TABLET | ORAL | Status: AC
  Filled 2021-03-05: qty 1

## 2021-03-05 MED ORDER — SODIUM CHLORIDE 0.9 % IV SOLN: INTRAVENOUS | Status: DC

## 2021-03-05 SURGICAL SUPPLY — 65 items
BAG COUNTER SPONGE SURGICOUNT (BAG) ×2 IMPLANT
BANDAGE ESMARK 6X9 LF (GAUZE/BANDAGES/DRESSINGS) ×1 IMPLANT
BASEPLATE TIBIAL UNV SZ5 (Plate) ×2 IMPLANT
BLADE SAG 18X100X1.27 (BLADE) ×2 IMPLANT
BNDG ELASTIC 6X5.8 VLCR STR LF (GAUZE/BANDAGES/DRESSINGS) ×2 IMPLANT
BNDG ESMARK 6X9 LF (GAUZE/BANDAGES/DRESSINGS) ×2
BOWL SMART MIX CTS (DISPOSABLE) IMPLANT
CEMENT BONE SIMPLEX SPEEDSET (Cement) ×4 IMPLANT
COOLER ICEMAN CLASSIC (MISCELLANEOUS) ×2 IMPLANT
COVER SURGICAL LIGHT HANDLE (MISCELLANEOUS) ×2 IMPLANT
CUFF TOURN SGL QUICK 34 (TOURNIQUET CUFF) ×1
CUFF TRNQT CYL 34X4.125X (TOURNIQUET CUFF) ×1 IMPLANT
DRAPE EXTREMITY T 121X128X90 (DISPOSABLE) ×2 IMPLANT
DRAPE HALF SHEET 40X57 (DRAPES) ×2 IMPLANT
DRAPE U-SHAPE 47X51 STRL (DRAPES) ×2 IMPLANT
DRSG PAD ABDOMINAL 8X10 ST (GAUZE/BANDAGES/DRESSINGS) ×2 IMPLANT
DRSG XEROFORM 1X8 (GAUZE/BANDAGES/DRESSINGS) ×2 IMPLANT
DURAPREP 26ML APPLICATOR (WOUND CARE) ×2 IMPLANT
ELECT CAUTERY BLADE 6.4 (BLADE) ×2 IMPLANT
ELECT REM PT RETURN 9FT ADLT (ELECTROSURGICAL) ×2
ELECTRODE REM PT RTRN 9FT ADLT (ELECTROSURGICAL) ×1 IMPLANT
FACESHIELD WRAPAROUND (MASK) ×4 IMPLANT
FEMORAL PEG DISTAL FIXATION (Orthopedic Implant) ×2 IMPLANT
FEMORAL POST STABILIZED NO 6 (Orthopedic Implant) ×2 IMPLANT
GAUZE SPONGE 4X4 12PLY STRL (GAUZE/BANDAGES/DRESSINGS) ×2 IMPLANT
GAUZE XEROFORM 1X8 LF (GAUZE/BANDAGES/DRESSINGS) ×2 IMPLANT
GLOVE SRG 8 PF TXTR STRL LF DI (GLOVE) ×2 IMPLANT
GLOVE SURG ORTHO LTX SZ7.5 (GLOVE) ×2 IMPLANT
GLOVE SURG ORTHO LTX SZ8 (GLOVE) ×2 IMPLANT
GLOVE SURG UNDER POLY LF SZ8 (GLOVE) ×2
GOWN STRL REUS W/ TWL LRG LVL3 (GOWN DISPOSABLE) ×2 IMPLANT
GOWN STRL REUS W/ TWL XL LVL3 (GOWN DISPOSABLE) ×2 IMPLANT
GOWN STRL REUS W/TWL LRG LVL3 (GOWN DISPOSABLE) ×2
GOWN STRL REUS W/TWL XL LVL3 (GOWN DISPOSABLE) ×2
HANDPIECE INTERPULSE COAX TIP (DISPOSABLE) ×1
IMMOBILIZER KNEE 22 UNIV (SOFTGOODS) ×2 IMPLANT
INSERT TIB TRIATH 5X13 (Insert) ×2 IMPLANT
KIT BASIN OR (CUSTOM PROCEDURE TRAY) ×2 IMPLANT
KIT TURNOVER KIT B (KITS) ×2 IMPLANT
MANIFOLD NEPTUNE II (INSTRUMENTS) ×2 IMPLANT
NS IRRIG 1000ML POUR BTL (IV SOLUTION) ×2 IMPLANT
PACK TOTAL JOINT (CUSTOM PROCEDURE TRAY) ×2 IMPLANT
PAD ARMBOARD 7.5X6 YLW CONV (MISCELLANEOUS) ×2 IMPLANT
PADDING CAST ABS 6INX4YD NS (CAST SUPPLIES) ×1
PADDING CAST ABS COTTON 6X4 NS (CAST SUPPLIES) ×1 IMPLANT
PADDING CAST COTTON 6X4 STRL (CAST SUPPLIES) ×2 IMPLANT
PATELLA 32MMX10MM (Knees) ×2 IMPLANT
PIN FLUTED HEDLESS FIX 3.5X1/8 (PIN) ×2 IMPLANT
SET HNDPC FAN SPRY TIP SCT (DISPOSABLE) ×1 IMPLANT
SET PAD KNEE POSITIONER (MISCELLANEOUS) ×2 IMPLANT
STAPLER VISISTAT 35W (STAPLE) ×2 IMPLANT
STRIP CLOSURE SKIN 1/2X4 (GAUZE/BANDAGES/DRESSINGS) IMPLANT
SUCTION FRAZIER HANDLE 10FR (MISCELLANEOUS) ×1
SUCTION TUBE FRAZIER 10FR DISP (MISCELLANEOUS) ×1 IMPLANT
SUT VIC AB 0 CT1 27 (SUTURE) ×1
SUT VIC AB 0 CT1 27XBRD ANBCTR (SUTURE) ×1 IMPLANT
SUT VIC AB 1 CT1 27 (SUTURE) ×2
SUT VIC AB 1 CT1 27XBRD ANBCTR (SUTURE) ×2 IMPLANT
SUT VIC AB 2-0 CT1 27 (SUTURE) ×2
SUT VIC AB 2-0 CT1 TAPERPNT 27 (SUTURE) ×2 IMPLANT
SYR 50ML LL SCALE MARK (SYRINGE) IMPLANT
TOWEL GREEN STERILE (TOWEL DISPOSABLE) ×2 IMPLANT
TOWEL GREEN STERILE FF (TOWEL DISPOSABLE) ×2 IMPLANT
TRAY CATH 16FR W/PLASTIC CATH (SET/KITS/TRAYS/PACK) ×2 IMPLANT
WRAP KNEE MAXI GEL POST OP (GAUZE/BANDAGES/DRESSINGS) ×2 IMPLANT

## 2021-03-05 NOTE — Evaluation (Signed)
Physical Therapy Evaluation Patient Details Name: DOCTOR SHEAHAN MRN: [id number] DOB: 21-Oct-1955 Today's Date: 03/05/2021  History of Present Illness  Pt is 65 yo male s/p R TKA on 03/05/21.  Pt with hx including L TKA 10/09/20, arthritis, bil hearing loss, CAD, HTN, prior substance abuse-sober 3 years.  Clinical Impression  Pt is s/p TKA resulting in the deficits listed below (see PT Problem List). At baseline, pt is completely independent.  He had his L TKA earlier this year and reports did well and completely back to normal.  At evaluation, pt was significantly limited by pain and unable to tolerate sitting EOB.  Attempted mobility, repositioning, ice , and RN medicated - but no relief and unable to tolerate further therapy.  Pt is expected to progress well as pain is controlled. He has DME and support at home. Pt will benefit from skilled PT to increase their independence and safety with mobility to allow discharge to the venue listed below.         Recommendations for follow up therapy are one component of a multi-disciplinary discharge planning process, led by the attending physician.  Recommendations may be updated based on patient status, additional functional criteria and insurance authorization.  Follow Up Recommendations Follow surgeon's recommendation for DC plan and follow-up therapies    Equipment Recommendations  None recommended by PT (has RW and elevated toilet with grab bars)    Recommendations for Other Services       Precautions / Restrictions Precautions Precautions: Fall Required Braces or Orthoses: Knee Immobilizer - Right      Mobility  Bed Mobility Overal bed mobility: Needs Assistance Bed Mobility: Supine to Sit;Sit to Supine     Supine to sit: Min assist Sit to supine: Min assist   General bed mobility comments: Partial transfers supine to sit and directly back to supine due to pain.  Unable to get completely turned to EOB or feet on floor due to pain.   Therapist supporting R leg during transfers    Transfers                 General transfer comment: unable due to pain  Ambulation/Gait             General Gait Details: unable due to pain  Stairs            Wheelchair Mobility    Modified Rankin (Stroke Patients Only)       Balance Overall balance assessment: Needs assistance     Sitting balance - Comments: unable due to pain       Standing balance comment: unable due to pain                             Pertinent Vitals/Pain Pain Assessment: 0-10 Pain Score: 10-Worst pain ever Pain Location: R knee Pain Descriptors / Indicators: Grimacing;Restless;Sharp;Other (Comment) (deep) Pain Intervention(s): Limited activity within patient's tolerance;Monitored during session;Premedicated before session;RN gave pain meds during session;Ice applied;Repositioned    Home Living Family/patient expects to be discharged to:: Private residence Living Arrangements: Spouse/significant other Available Help at Discharge: Family;Available 24 hours/day Type of Home: House Home Access: Stairs to enter Entrance Stairs-Rails: Right Entrance Stairs-Number of Steps: 2 Home Layout: One level Home Equipment: Clinical cytogeneticist - 2 wheels;Grab bars - tub/shower;Grab bars - toilet      Prior Function Level of Independence: Independent  Hand Dominance        Extremity/Trunk Assessment   Upper Extremity Assessment Upper Extremity Assessment: Overall WFL for tasks assessed    Lower Extremity Assessment Lower Extremity Assessment: LLE deficits/detail;RLE deficits/detail RLE Deficits / Details: Severely limited by post-op pain;  ROM: ankle and hip WFL, ROM knee 8 to 20 degrees limited by pain; MMT: ankle 3/5, knee and hip 1/5 not further tested due to pain LLE Deficits / Details: WFL    Cervical / Trunk Assessment Cervical / Trunk Assessment: Normal  Communication   Communication: HOH   Cognition Arousal/Alertness: Awake/alert Behavior During Therapy: WFL for tasks assessed/performed Overall Cognitive Status: Within Functional Limits for tasks assessed                                        General Comments General comments (skin integrity, edema, etc.): Pt severely limited by pain.  He had received medication in PACU and RN administered Dilaudid during evaluation.  Tried repositioning R leg, sliding up in bed, and sitting but pain too severe and pt returned to supine.  Placed ice cuff over ACE wrap and assisted RN in placing SCDs. Did not replace KI as pt in siginificant pain and has leg straight    Exercises     Assessment/Plan    PT Assessment Patient needs continued PT services  PT Problem List Decreased strength;Decreased safety awareness;Decreased mobility;Decreased range of motion;Decreased activity tolerance;Decreased balance;Decreased knowledge of use of DME;Pain       PT Treatment Interventions DME instruction;Therapeutic activities;Modalities;Gait training;Therapeutic exercise;Patient/family education;Stair training;Balance training;Functional mobility training    PT Goals (Current goals can be found in the Care Plan section)  Acute Rehab PT Goals Patient Stated Goal: decrease pain PT Goal Formulation: With patient Time For Goal Achievement: 03/19/21 Potential to Achieve Goals: Good    Frequency 7X/week   Barriers to discharge        Co-evaluation               AM-PAC PT "6 Clicks" Mobility  Outcome Measure Help needed turning from your back to your side while in a flat bed without using bedrails?: A Little Help needed moving from lying on your back to sitting on the side of a flat bed without using bedrails?: A Little Help needed moving to and from a bed to a chair (including a wheelchair)?: A Lot Help needed standing up from a chair using your arms (e.g., wheelchair or bedside chair)?: A Lot Help needed to walk in  hospital room?: A Lot Help needed climbing 3-5 steps with a railing? : A Lot 6 Click Score: 14    End of Session   Activity Tolerance: Patient limited by pain Patient left: in bed;with call bell/phone within reach;with bed alarm set;with SCD's reapplied Nurse Communication: Mobility status PT Visit Diagnosis: Other abnormalities of gait and mobility (R26.89);Muscle weakness (generalized) (M62.81);Pain Pain - Right/Left: Right Pain - part of body: Knee    Time: 3491-7915 PT Time Calculation (min) (ACUTE ONLY): 22 min   Charges:   PT Evaluation $PT Eval Low Complexity: 1 Low          Eyana Stolze, PT Acute Rehab Services Pager [phone] [facility] [phone]   Karlton Lemon 03/05/2021, 5:35 PM

## 2021-03-05 NOTE — Anesthesia Postprocedure Evaluation (Signed)
Anesthesia Post Note  Patient: Samuel [name]  Procedure(s) Performed: RIGHT TOTAL KNEE ARTHROPLASTY (Right: Knee)     Patient location during evaluation: PACU Anesthesia Type: Spinal Level of consciousness: awake Pain management: pain level controlled Vital Signs Assessment: post-procedure vital signs reviewed and stable Respiratory status: spontaneous breathing Cardiovascular status: stable Postop Assessment: no apparent nausea or vomiting Anesthetic complications: no   No notable events documented.  Last Vitals:  Vitals:   03/05/21 1440 03/05/21 1455  BP: 125/64 119/77  Pulse: 66 68  Resp: 17 18  Temp:    SpO2: 98% 100%    Last Pain:  Vitals:   03/05/21 1455  TempSrc:   PainSc: 0-No pain                 Kumar Falwell

## 2021-03-05 NOTE — H&P (Signed)
TOTAL KNEE ADMISSION H&P  Patient is being admitted for right total knee arthroplasty.  Subjective:  Chief Complaint:right knee pain.  HPI: Samuel [name], 65 y.o. male, has a history of pain and functional disability in the right knee due to arthritis and has failed non-surgical conservative treatments for greater than 12 weeks to includeNSAID's and/or analgesics, corticosteriod injections, viscosupplementation injections, flexibility and strengthening excercises, use of assistive devices, weight reduction as appropriate, and activity modification.  Onset of symptoms was gradual, starting 2 years ago with gradually worsening course since that time. The patient noted prior procedures on the knee to include  arthroscopy on the right knee(s).  Patient currently rates pain in the right knee(s) at 10 out of 10 with activity. Patient has night pain, worsening of pain with activity and weight bearing, pain that interferes with activities of daily living, pain with passive range of motion, crepitus, and joint swelling.  Patient has evidence of subchondral sclerosis, periarticular osteophytes, and joint space narrowing by imaging studies. There is no active infection.  Patient Active Problem List   Diagnosis Date Noted  . Status post left knee replacement 10/09/2020  . Unilateral primary osteoarthritis, left knee 06/22/2018  . Unilateral primary osteoarthritis, right knee 06/22/2018  . Screening for colorectal cancer 05/17/2018  . Nonobstructive atherosclerosis of coronary artery 03/05/2018  . Obesity (BMI 30-39.9) 03/05/2018  . Former heavy cigarette smoker (20-39 per day) 03/05/2018  . Varicose veins of bilateral lower extremities with pain 12/28/2017  . Umbilical hernia 23/76/2831  . Alcohol abuse, in remission 09/17/2017  . Osteoarthritis of knee 08/10/2017  . Rosacea 06/11/2017  . Subclinical hypothyroidism 06/05/2017  . Essential hypertension 05/27/2017  . Bilateral hearing loss 05/04/2014    Past Medical History:  Diagnosis Date  . Anxiety   . Arthritis   . Basal cell carcinoma 04/29/2018   upper mid back, right mid back   . Basal cell carcinoma 04/29/2018   right mid back   . Basal cell carcinoma 06/10/2018   left lover back   . Bronchitis   . Coronary artery disease    Mild-moderate non-obstructive CAD 06/15/17 (20% dLM, 50% mLAD, 30% OM3, 40% mRCA, 40% dRCA)  . COVID-19 unknown  . Depression   . Former heavy cigarette smoker (20-39 per day) 03/05/2018   Quit 2000; 60 pak year history  . History of hiatal hernia   . HTN (hypertension)   . Substance abuse ([place])    sober for 3 years    Past Surgical History:  Procedure Laterality Date  . HERNIA REPAIR    . KNEE ARTHROSCOPY Bilateral   . LEFT HEART CATH AND CORONARY ANGIOGRAPHY N/A 06/15/2017   Procedure: LEFT HEART CATH AND CORONARY ANGIOGRAPHY;  Surgeon: Nelva Bush, MD;  Location: [facility];  Service: Cardiovascular;  Laterality: N/A;  . TONSILLECTOMY    . TOTAL KNEE ARTHROPLASTY Left 10/09/2020   Procedure: LEFT TOTAL KNEE ARTHROPLASTY;  Surgeon: Mcarthur Rossetti, MD;  Location: [facility];  Service: Orthopedics;  Laterality: Left;  . UMBILICAL HERNIA REPAIR N/A 11/03/2017   Procedure: UMBILICAL HERNIA REPAIR;  Surgeon: Clovis Riley, MD;  Location: [facility];  Service: General;  Laterality: N/A;    Current Facility-Administered Medications  Medication Dose Route Frequency Provider Last Rate Last Admin  . ceFAZolin (ANCEF) IVPB 2g/100 mL premix  2 g Intravenous On Call to OR Pete Pelt, PA-C      . fentaNYL (SUBLIMAZE) 100 MCG/2ML injection           .  lactated ringers infusion   Intravenous Continuous Ellender, Karyl Kinnier, MD 10 mL/hr at 03/05/21 1039 New Bag at 03/05/21 1039  . midazolam (VERSED) 2 MG/2ML injection           . tranexamic acid (CYKLOKAPRON) IVPB 1,000 mg  1,000 mg Intravenous To OR Pete Pelt, PA-C       No Known Allergies  Social History   Tobacco Use  .  Smoking status: Former    Packs/day: 2.00    Years: 30.00    Pack years: 60.00    Types: Cigarettes    Quit date: 06/16/1998    Years since quitting: 22.7  . Smokeless tobacco: Never  Substance Use Topics  . Alcohol use: No    Comment: sober 08-28-2017    Family History  Problem Relation Age of Onset  . Dementia Mother        alcohol  . Alcohol abuse Mother   . Heart attack Father 23  . Heart disease Father 19  . COPD Father   . Stroke Father   . Healthy Daughter   . Healthy Son   . Healthy Son   . CAD Brother   . Heart attack Brother      Review of Systems  Musculoskeletal:  Positive for gait problem and joint swelling.  All other systems reviewed and are negative.  Objective:  Physical Exam Vitals reviewed.  Constitutional:      Appearance: Normal appearance.  HENT:     Head: Normocephalic and atraumatic.  Eyes:     Extraocular Movements: Extraocular movements intact.     Pupils: Pupils are equal, round, and reactive to light.  Cardiovascular:     Rate and Rhythm: Normal rate and regular rhythm.     Pulses: Normal pulses.  Pulmonary:     Effort: Pulmonary effort is normal.     Breath sounds: Normal breath sounds.  Abdominal:     Palpations: Abdomen is soft.  Musculoskeletal:     Cervical back: Normal range of motion and neck supple.     Right knee: Effusion, bony tenderness and crepitus present. Decreased range of motion. Tenderness present over the medial joint line, lateral joint line and patellar tendon. Abnormal alignment.  Neurological:     Mental Status: He is alert and oriented to person, place, and time.  Psychiatric:        Behavior: Behavior normal.    Vital signs in last 24 hours: Temp:  [97.8 F (36.6 C)] 97.8 F (36.6 C) (09/20 1009) Pulse Rate:  [84] 84 (09/20 1009) Resp:  [18] 18 (09/20 1009) BP: (163)/(80) 163/80 (09/20 1009) SpO2:  [97 %] 97 % (09/20 1009) Weight:  [106.6 kg] 106.6 kg (09/20 1009)  Labs:   Estimated body mass  index is 36.81 kg/m as calculated from the following:   Height as of this encounter: 5\' 7"  (1.702 m).   Weight as of this encounter: 106.6 kg.   Imaging Review Plain radiographs demonstrate severe degenerative joint disease of the right knee(s). The overall alignment ismild varus. The bone quality appears to be good for age and reported activity level.      Assessment/Plan:  End stage arthritis, right knee   The patient history, physical examination, clinical judgment of the provider and imaging studies are consistent with end stage degenerative joint disease of the right knee(s) and total knee arthroplasty is deemed medically necessary. The treatment options including medical management, injection therapy arthroscopy and arthroplasty were discussed at length. The risks  and benefits of total knee arthroplasty were presented and reviewed. The risks due to aseptic loosening, infection, stiffness, patella tracking problems, thromboembolic complications and other imponderables were discussed. The patient acknowledged the explanation, agreed to proceed with the plan and consent was signed. Patient is being admitted for inpatient treatment for surgery, pain control, PT, OT, prophylactic antibiotics, VTE prophylaxis, progressive ambulation and ADL's and discharge planning. The patient is planning to be discharged home with home health services

## 2021-03-05 NOTE — Anesthesia Procedure Notes (Signed)
Procedure Name: MAC Date/Time: 03/05/2021 12:20 PM Performed by: Moshe Salisbury, CRNA Pre-anesthesia Checklist: Patient identified, Emergency Drugs available, Suction available and Patient being monitored Patient Re-evaluated:Patient Re-evaluated prior to induction Oxygen Delivery Method: Nasal cannula Ventilation: Oral airway inserted - appropriate to patient size Placement Confirmation: positive ETCO2 Dental Injury: Teeth and Oropharynx as per pre-operative assessment

## 2021-03-05 NOTE — Brief Op Note (Signed)
03/05/2021  2:06 PM  PATIENT:  Samuel [name]  65 y.o. male  PRE-OPERATIVE DIAGNOSIS:  osteoarthritis right knee  POST-OPERATIVE DIAGNOSIS:  osteoarthritis right knee  PROCEDURE:  Procedure(s): RIGHT TOTAL KNEE ARTHROPLASTY (Right)  SURGEON:  Surgeon(s) and Role:    Mcarthur Rossetti, MD - Primary  PHYSICIAN ASSISTANT:  Benita Stabile, PA-C  ANESTHESIA:   regional and spinal  COUNTS:  YES  TOURNIQUET:   Total Tourniquet Time Documented: Thigh (Right) - 70 minutes Total: Thigh (Right) - 70 minutes   DICTATION: .Other Dictation: Dictation Number [id number]  PLAN OF CARE: Admit for overnight observation  PATIENT DISPOSITION:  PACU - hemodynamically stable.   Delay start of Pharmacological VTE agent (>24hrs) due to surgical blood loss or risk of bleeding: no

## 2021-03-05 NOTE — Anesthesia Procedure Notes (Signed)
Anesthesia Regional Block: Adductor canal block   Pre-Anesthetic Checklist: , timeout performed,  Correct Patient, Correct Site, Correct Laterality,  Correct Procedure, Correct Position, site marked,  Risks and benefits discussed,  Surgical consent,  Pre-op evaluation,  At surgeon's request and post-op pain management  Laterality: Right  Prep: chloraprep       Needles:  Injection technique: Single-shot  Needle Type: Echogenic Stimulator Needle     Needle Length: 10cm  Needle Gauge: 21   Needle insertion depth: 8 cm   Additional Needles:   Narrative:  Start time: 03/05/2021 11:55 AM End time: 03/05/2021 12:00 PM  Performed by: Personally  Anesthesiologist: Josephine Igo, MD  Additional Notes: Timeout performed. Patient sedated. Relevant anatomy ID'd using [place]. Incremental 2-21ml injection of LA with frequent aspiration. Patient tolerated procedure well.    Right Adductor Canal Block

## 2021-03-05 NOTE — Anesthesia Procedure Notes (Signed)
Spinal  Patient location during procedure: OR Start time: 03/05/2021 12:08 PM End time: 03/05/2021 12:12 PM Reason for block: surgical anesthesia Preanesthetic Checklist Completed: patient identified, IV checked, site marked, risks and benefits discussed, surgical consent, monitors and equipment checked, pre-op evaluation and timeout performed Spinal Block Patient position: sitting Prep: DuraPrep and site prepped and draped Patient monitoring: heart rate, cardiac monitor, continuous pulse ox and blood pressure Approach: midline Location: L4-5 Injection technique: single-shot Needle Needle type: Sprotte and Pencan  Needle gauge: 24 G Needle length: 9 cm Needle insertion depth: 7 cm Assessment Sensory level: T6 Events: CSF return Additional Notes Patient tolerated procedure well. Adequate sensory level.

## 2021-03-05 NOTE — Transfer of Care (Signed)
Immediate Anesthesia Transfer of Care Note  Patient: Samuel [name]  Procedure(s) Performed: RIGHT TOTAL KNEE ARTHROPLASTY (Right: Knee)  Patient Location: PACU  Anesthesia Type:MAC combined with regional for post-op pain  Level of Consciousness: awake and pateint uncooperative  Airway & Oxygen Therapy: Patient Spontanous Breathing and Patient connected to nasal cannula oxygen  Post-op Assessment: Report given to RN and Post -op Vital signs reviewed and stable  Post vital signs: Reviewed and stable  Last Vitals:  Vitals Value Taken Time  BP 118/66 03/05/21 1424  Temp    Pulse 73 03/05/21 1426  Resp 14 03/05/21 1426  SpO2 99 % 03/05/21 1426  Vitals shown include unvalidated device data.  Last Pain:  Vitals:   03/05/21 1200  TempSrc:   PainSc: 0-No pain      Patients Stated Pain Goal: 5 (22/58/34 6219)  Complications: No notable events documented.

## 2021-03-05 NOTE — Op Note (Signed)
NAME: Samuel [name], Samuel [name] MEDICAL RECORD NO: [id number] ACCOUNT NO: [id number] DATE OF BIRTH: [birth year] FACILITY: [facility] LOCATION: [facility] PHYSICIAN: Lind Guest. Ninfa Linden, MD  Operative Report   DATE OF PROCEDURE: 03/05/2021  PREOPERATIVE DIAGNOSES:  Primary osteoarthritis and degenerative joint disease, right knee.  POSTOPERATIVE DIAGNOSES:  Primary osteoarthritis and degenerative joint disease, right knee.  PROCEDURE:  Right total knee arthroplasty.  IMPLANTS:  Stryker Triathlon cemented knee system with size 6 femur, size 5 tibial universal baseplate, size 13 constrained TS insert, polyethylene insert, size 32 patellar button.  SURGEON:  Lind Guest. Ninfa Linden, MD  ASSISTANT:  Erskine Emery, PA-C  ANESTHESIA: 1.  Right lower extremity adductor canal block. 2.  Spinal.  ANTIBIOTICS:  2 g IV Ancef.  TOURNIQUET TIME:  109 minutes.  BLOOD LOSS:  Less than 361 mL  COMPLICATIONS:  None.  INDICATIONS:  The patient is a 65 year old gentleman with debilitating arthritis involving his right knee.  I actually replaced his left knee in April of this year.  His right knee shows varus malalignment and significant osteophytes around the knee on  the x-rays.  There is complete loss of medial joint space in the patellofemoral joint.  His right knee pain is daily and it is detrimentally affecting his mobility, his quality of life and his activities of daily living to the point he does wish to  proceed with a total knee arthroplasty on the right side.  Having had this before, he is fully aware of the risk of acute blood loss anemia, nerve and vessel injury, fracture, infection, DVT, implant failure and skin and soft tissues issues.  He  understands our goals are decrease pain, improve mobility and overall improve quality of life.  DESCRIPTION OF PROCEDURE:  After informed consent was obtained, appropriate right knee was marked, he was brought to the operating room after an adductor canal block was  obtained in the right lower extremity in the holding room.  In the operating room,  he was sat up on the operating table and spinal anesthesia was obtained. He was laid in the supine position on the operating table.  Foley catheter was placed and a nonsterile tourniquet was placed around his upper right thigh.  His right thigh, knee,  leg, ankle and foot were prepped and draped with DuraPrep and sterile drapes.  A timeout was called and he was identified correct patient, correct right knee.  We then used Esmarch to wrap out the leg and tourniquet was inflated to 300 mm pressure.  We  then made a direct midline incision over the patella and carried this proximally and distally.  We dissected down the knee joint and carried out a medial parapatellar arthrotomy, finding a very large joint effusion.  With the knee in a flexed position,  we removed periarticular osteophytes from all 3 compartments of the knee.  There was a lot of synovitis in the knee, so we removed synovium and then remnants of the ACL, PCL, medial and lateral meniscus.  We then started with our proximal tibia cut using  an extramedullary guide, setting this for taking 9 mm off the high side and correcting for varus and valgus and neutral slope.  We did based on our cut backed this down 2 more millimeters.  We then went to the femur and used an intramedullary guide for  doing our distal femoral cut.  Since he had full extension prior to surgery, we made this set for an 8 mm distal femoral cut for a right knee  at 5 degrees externally rotated.  We held that knee, we made that cut without difficulty, and brought the knee  back down to full extension and it actually took an 11 mm extension block to have full extension.  He hyperextended with a 9 mm block.  We then went back to the femur, used our femoral sizing guide based off the epicondylar axis and Whiteside's line.   Based off of this, we chose a size 6 femur.  We put a 4-in-1 cutting block for  a size 6 femur, made our anterior and posterior cuts, followed by our chamfer cuts.  We then made our femoral box cut.  Attention was then turned back to the tibia.  We chose  a size 5 tibial tray for coverage on the tibial tubercle and this was similar to the other side.  We did our keel punch off of this and went with a size 5 tibial tray followed by size 6 right femur.  We trialled 11 and 13 mm insert and surprisingly even  though his flexion and extension was good, there was some instability to slightly varus and valgus stressing.  So at that point, I felt like he needed to have a constrained insert for this side.  We then went back to the tibia and did our keel punch  again but then we did a drill hole for universal baseplate.  We then went with a TS constrained insert and that really gave the stability.  We then made our patellar cut and drilled three holes for a size 32 patellar button.  I put the knee through  several cycles of motion with the trial implants in place and we were pleased with range of motion and stability.  We then removed all instruments from the knee.  We irrigated the knee with normal saline solution.  We then mixed our cement, with the knee  in a flexed position, cemented our real Stryker Triathlon universal baseplate for the tibia size 5 followed by cementing our size 6 right femur.  We placed our 13 mm TS constrained fixed bearing polyethylene insert and cemented our patellar button, size  32.  We then held the knee in a fully extended position to allow the cement to harden and compress.  Once we had accomplished that, we let the tourniquet down.  Hemostasis was obtained with electrocautery.  We then closed the arthrotomy with interrupted  #1 Vicryl suture followed by 0 Vicryl to close the deep tissue and 2-0 Vicryl to close the subcutaneous tissue.  The skin was reapproximated with staples.  A well-padded sterile dressing was applied.  He was taken to recovery room in stable  condition  with all final counts being correct.  No complications noted.  Of note, Benita Stabile, PA-C, did assist during the entire case and assistance was crucial for facilitating all aspects of this case.   SHW D: 03/05/2021 2:04:12 pm T: 03/05/2021 10:41:00 pm  JOB: [id number]/ [id number]

## 2021-03-06 ENCOUNTER — Encounter (HOSPITAL_COMMUNITY): Payer: Self-pay | Admitting: Orthopaedic Surgery

## 2021-03-06 DIAGNOSIS — M1711 Unilateral primary osteoarthritis, right knee: Secondary | ICD-10-CM | POA: Diagnosis not present

## 2021-03-06 DIAGNOSIS — Z96652 Presence of left artificial knee joint: Secondary | ICD-10-CM | POA: Diagnosis not present

## 2021-03-06 DIAGNOSIS — I10 Essential (primary) hypertension: Secondary | ICD-10-CM | POA: Diagnosis not present

## 2021-03-06 DIAGNOSIS — Z85828 Personal history of other malignant neoplasm of skin: Secondary | ICD-10-CM | POA: Diagnosis not present

## 2021-03-06 DIAGNOSIS — Z79899 Other long term (current) drug therapy: Secondary | ICD-10-CM | POA: Diagnosis not present

## 2021-03-06 DIAGNOSIS — Z87891 Personal history of nicotine dependence: Secondary | ICD-10-CM | POA: Diagnosis not present

## 2021-03-06 DIAGNOSIS — Z8616 Personal history of COVID-19: Secondary | ICD-10-CM | POA: Diagnosis not present

## 2021-03-06 LAB — CBC
HCT: 39.9 % (ref 39.0–52.0)
Hemoglobin: 13.4 g/dL (ref 13.0–17.0)
MCH: 29.5 pg (ref 26.0–34.0)
MCHC: 33.6 g/dL (ref 30.0–36.0)
MCV: 87.7 fL (ref 80.0–100.0)
Platelets: 199 10*3/uL (ref 150–400)
RBC: 4.55 MIL/uL (ref 4.22–5.81)
RDW: 13.7 % (ref 11.5–15.5)
WBC: 14.4 10*3/uL — ABNORMAL HIGH (ref 4.0–10.5)
nRBC: 0 % (ref 0.0–0.2)

## 2021-03-06 LAB — BASIC METABOLIC PANEL
Anion gap: 7 (ref 5–15)
BUN: 12 mg/dL (ref 8–23)
CO2: 25 mmol/L (ref 22–32)
Calcium: 8.7 mg/dL — ABNORMAL LOW (ref 8.9–10.3)
Chloride: 104 mmol/L (ref 98–111)
Creatinine, Ser: 1.1 mg/dL (ref 0.61–1.24)
GFR, Estimated: >60 mL/min (ref >60)
Glucose, Bld: 131 mg/dL — ABNORMAL HIGH (ref 70–99)
Potassium: 4.4 mmol/L (ref 3.5–5.1)
Sodium: 136 mmol/L (ref 135–145)

## 2021-03-06 NOTE — Care Plan (Signed)
Ortho Bundle Case Management Note  Patient Details  Name: Samuel [name] MRN: [id number] Date of Birth: [birth year]  [facility] call prior to surgery to discuss his Right total knee replacement with Dr. Ninfa Linden. He is an Ortho bundle patient through [facility] and is agreeable to case management. He lives with his wife and plans to return home after short hospital stay. He has all DME (RW, 3in1/BSC). He really wants to make sure he receives a "ice man" ice wrap for the knee during his stay. Referral for HHPT made to [facility] prior to surgery after choice provided. He would like to attend OPPT at [facility] in [place] again, where he was seen after his left total knee replacement.  CM will assist with this. Reviewed post op care instructions. Will continue to follow for needs.                  DME Arranged:   (Patient has all DME at home needed for discharge) DME Agency:     [place] Arranged:  PT [place]:  [place]  Additional Comments: Please contact me with any questions of if this plan should need to change.  Jamse Arn, RN, BSN, [employer]  [phone] 03/06/2021, 8:45 AM

## 2021-03-06 NOTE — Progress Notes (Signed)
Physical Therapy Treatment Patient Details Name: Samuel [name] MRN: [id number] DOB: 1955-07-11 Today's Date: 03/06/2021   History of Present Illness Pt is 65 yo male s/p R TKA on 03/05/21.  Pt with hx including L TKA 10/09/20, arthritis, bil hearing loss, CAD, HTN, prior substance abuse-sober 3 years.    PT Comments    Pt admitted with above diagnosis. Pt was able to ambulate with RW and incr distance today with min guard assist.  Pt reports pain but tolerated movement well overall.  Pt currently with functional limitations due to balance and endurance deficits. Pt will benefit from skilled PT to increase their independence and safety with mobility to allow discharge to the venue listed below.      Recommendations for follow up therapy are one component of a multi-disciplinary discharge planning process, led by the attending physician.  Recommendations may be updated based on patient status, additional functional criteria and insurance authorization.  Follow Up Recommendations  Follow surgeon's recommendation for DC plan and follow-up therapies     Equipment Recommendations  None recommended by PT (has RW and elevated toilet with grab bars)    Recommendations for Other Services       Precautions / Restrictions Precautions Precautions: Fall Required Braces or Orthoses: Knee Immobilizer - Right Restrictions Weight Bearing Restrictions: Yes RLE Weight Bearing: Weight bearing as tolerated     Mobility  Bed Mobility Overal bed mobility: Needs Assistance Bed Mobility: Supine to Sit;Sit to Supine     Supine to sit: Min assist     General bed mobility comments: Pt needed assist to initiate movement of right LE, incr time to come to EOB.    Transfers Overall transfer level: Needs assistance Equipment used: Rolling walker (2 wheeled) Transfers: Sit to/from Stand Sit to Stand: Min guard         General transfer comment: cues for hand placement. pt was able to stand and support  himself with some pain but no LOB  Ambulation/Gait Ambulation/Gait assistance: Min guard Gait Distance (Feet): 150 Feet Assistive device: Rolling walker (2 wheeled) Gait Pattern/deviations: Step-to pattern;Decreased stance time - right;Decreased weight shift to right   Gait velocity interpretation: <1.31 ft/sec, indicative of household ambulator General Gait Details: Pt able to progress ambultion to hallway with good sequencing of steps and rW.   Stairs             Wheelchair Mobility    Modified Rankin (Stroke Patients Only)       Balance Overall balance assessment: Needs assistance Sitting-balance support: No upper extremity supported;Feet supported Sitting balance-Leahy Scale: Fair     Standing balance support: Bilateral upper extremity supported;During functional activity Standing balance-Leahy Scale: Poor Standing balance comment: relies on UE support                            Cognition Arousal/Alertness: Awake/alert Behavior During Therapy: WFL for tasks assessed/performed Overall Cognitive Status: Within Functional Limits for tasks assessed                                        Exercises Total Joint Exercises Ankle Circles/Pumps: AROM;Both;10 reps;Supine Quad Sets: AROM;Both;10 reps;Supine Heel Slides: AAROM;Both;10 reps;Supine Straight Leg Raises: AAROM;Both;10 reps;Supine Long Arc Quad: AAROM;Both;5 reps;Seated Knee Flexion: AAROM;Both;5 reps;Seated Goniometric ROM: 8-70    General Comments General comments (skin integrity, edema, etc.): Ice present on  arrrival and replaced after session. VSS with 94% on RA      Pertinent Vitals/Pain Pain Assessment: 0-10 Pain Score: 7  Pain Location: R knee Pain Descriptors / Indicators: Grimacing;Restless;Sharp;Other (Comment) (deep) Pain Intervention(s): Limited activity within patient's tolerance;Monitored during session;Premedicated before session;Repositioned;Ice applied     Home Living                      Prior Function            PT Goals (current goals can now be found in the care plan section) Acute Rehab PT Goals Patient Stated Goal: decrease pain Progress towards PT goals: Progressing toward goals    Frequency    7X/week      PT Plan Current plan remains appropriate    Co-evaluation              AM-PAC PT "6 Clicks" Mobility   Outcome Measure  Help needed turning from your back to your side while in a flat bed without using bedrails?: A Little Help needed moving from lying on your back to sitting on the side of a flat bed without using bedrails?: A Little Help needed moving to and from a bed to a chair (including a wheelchair)?: A Little Help needed standing up from a chair using your arms (e.g., wheelchair or bedside chair)?: A Little Help needed to walk in hospital room?: A Little Help needed climbing 3-5 steps with a railing? : A Lot 6 Click Score: 17    End of Session Equipment Utilized During Treatment: Gait belt;Right knee immobilizer Activity Tolerance: Patient limited by pain;Patient limited by fatigue Patient left: with call bell/phone within reach;in chair;with chair alarm set Nurse Communication: Mobility status PT Visit Diagnosis: Other abnormalities of gait and mobility (R26.89);Muscle weakness (generalized) (M62.81);Pain Pain - Right/Left: Right Pain - part of body: Knee     Time: 0925-1010 PT Time Calculation (min) (ACUTE ONLY): 45 min  Charges:  $Gait Training: 8-22 mins $Therapeutic Exercise: 8-22 mins $Therapeutic Activity: 8-22 mins                     Bronwen Pendergraft M,PT Acute Rehab Services [phone] (pager)    Alvira Philips 03/06/2021, 1:22 PM

## 2021-03-06 NOTE — Discharge Instructions (Signed)

## 2021-03-06 NOTE — Progress Notes (Signed)
Subjective: 1 Day Post-Op Procedure(s) (LRB): RIGHT TOTAL KNEE ARTHROPLASTY (Right) Patient reports pain as moderate.    Objective: Vital signs in last 24 hours: Temp:  [97.1 F (36.2 C)-98 F (36.7 C)] 98 F (36.7 C) (09/21 0513) Pulse Rate:  [57-84] 75 (09/21 0513) Resp:  [13-18] 17 (09/21 0513) BP: (105-171)/(59-89) 105/59 (09/21 0513) SpO2:  [96 %-100 %] 96 % (09/21 0513) Weight:  [106.6 kg] 106.6 kg (09/20 1009)  Intake/Output from previous day: 09/20 0701 - 09/21 0700 In: 2835.7 [P.O.:360; I.V.:2275.7; IV Piggyback:200] Out: 465 [Urine:465] Intake/Output this shift: No intake/output data recorded.  Recent Labs    03/06/21 0205  HGB 13.4   Recent Labs    03/06/21 0205  WBC 14.4*  RBC 4.55  HCT 39.9  PLT 199   Recent Labs    03/06/21 0205  NA 136  K 4.4  CL 104  CO2 25  BUN 12  CREATININE 1.10  GLUCOSE 131*  CALCIUM 8.7*   No results for input(s): LABPT, INR in the last 72 hours.  Sensation intact distally Intact pulses distally Dorsiflexion/Plantar flexion intact Incision: scant drainage Compartment soft   Assessment/Plan: 1 Day Post-Op Procedure(s) (LRB): RIGHT TOTAL KNEE ARTHROPLASTY (Right) Up with therapy      Mcarthur Rossetti 03/06/2021, 7:36 AM

## 2021-03-06 NOTE — Progress Notes (Signed)
   03/06/21 1323  PT Visit Information  Last PT Received On 03/06/21  Assistance Needed +1  History of Present Illness Pt is 65 yo male s/p R TKA on 03/05/21.  Pt with hx including L TKA 10/09/20, arthritis, bil hearing loss, CAD, HTN, prior substance abuse-sober 3 years.  Subjective Data  Patient Stated Goal decrease pain  Precautions  Precautions Fall  Required Braces or Orthoses Knee Immobilizer - Right  Restrictions  Weight Bearing Restrictions Yes  RLE Weight Bearing WBAT  Pain Assessment  Pain Assessment 0-10  Pain Score 5  Pain Location R knee  Pain Descriptors / Indicators Grimacing;Restless;Sharp;Other (Comment) (deep)  Pain Intervention(s) Limited activity within patient's tolerance;Monitored during session;Repositioned;Patient requesting pain meds-RN notified  Cognition  Arousal/Alertness Awake/alert  Behavior During Therapy WFL for tasks assessed/performed  Overall Cognitive Status Within Functional Limits for tasks assessed  Bed Mobility  General bed mobility comments Pt in chair and agreed to exercsies only.  General Comments  General comments (skin integrity, edema, etc.) Removed ice and left off after exercises  Exercises  Exercises Total Joint  Total Joint Exercises  Ankle Circles/Pumps AROM;Both;10 reps;Supine  Quad Sets AROM;Both;10 reps;Supine  [place] Lure;Both;5 reps;Seated  Heel Slides AAROM;Both;10 reps;Supine  Straight Leg Raises AAROM;Both;10 reps;Supine  Knee Flexion AAROM;Both;5 reps;Seated  Goniometric ROM 6-75  PT - End of Session  Equipment Utilized During Treatment Gait belt;Right knee immobilizer  Activity Tolerance Patient limited by pain;Patient limited by fatigue  Patient left with call bell/phone within reach;in chair;with chair alarm set  Nurse Communication Mobility status   PT - Assessment/Plan  PT Plan Current plan remains appropriate  PT Visit Diagnosis Other abnormalities of gait and mobility (R26.89);Muscle weakness  (generalized) (M62.81);Pain  Pain - Right/Left Right  Pain - part of body Knee  PT Frequency (ACUTE ONLY) 7X/week  Follow Up Recommendations Follow surgeon's recommendation for DC plan and follow-up therapies  PT equipment None recommended by PT (has RW and elevated toilet with grab bars)  AM-PAC PT "6 Clicks" Mobility Outcome Measure (Version 2)  Help needed turning from your back to your side while in a flat bed without using bedrails? 3  Help needed moving from lying on your back to sitting on the side of a flat bed without using bedrails? 3  Help needed moving to and from a bed to a chair (including a wheelchair)? 3  Help needed standing up from a chair using your arms (e.g., wheelchair or bedside chair)? 3  Help needed to walk in hospital room? 3  Help needed climbing 3-5 steps with a railing?  2  6 Click Score 17  Consider Recommendation of Discharge To: Home with [facility]  Progressive Mobility  What is the highest level of mobility based on the progressive mobility assessment? Level 5 (Walks with assist in room/hall) - Balance while stepping forward/back and can walk in room with assist - Complete  Mobility Ambulated with assistance in room;Ambulated with assistance in hallway  PT Time Calculation  PT Start Time (ACUTE ONLY) 1157  PT Stop Time (ACUTE ONLY) 1220  PT Time Calculation (min) (ACUTE ONLY) 23 min  PT General Charges  $$ ACUTE PT VISIT 1 Visit  PT Treatments  $Therapeutic Exercise 23-37 mins  Pt was able to perform exercises in pm. Was not ready to go back to bed and declined a walk.  Pt progressing well. Kamdon Reisig M,PT Acute Rehab Services [phone] (pager)

## 2021-03-07 DIAGNOSIS — Z96652 Presence of left artificial knee joint: Secondary | ICD-10-CM | POA: Diagnosis not present

## 2021-03-07 DIAGNOSIS — Z8616 Personal history of COVID-19: Secondary | ICD-10-CM | POA: Diagnosis not present

## 2021-03-07 DIAGNOSIS — Z79899 Other long term (current) drug therapy: Secondary | ICD-10-CM | POA: Diagnosis not present

## 2021-03-07 DIAGNOSIS — M1711 Unilateral primary osteoarthritis, right knee: Secondary | ICD-10-CM | POA: Diagnosis not present

## 2021-03-07 DIAGNOSIS — Z85828 Personal history of other malignant neoplasm of skin: Secondary | ICD-10-CM | POA: Diagnosis not present

## 2021-03-07 DIAGNOSIS — I10 Essential (primary) hypertension: Secondary | ICD-10-CM | POA: Diagnosis not present

## 2021-03-07 DIAGNOSIS — Z87891 Personal history of nicotine dependence: Secondary | ICD-10-CM | POA: Diagnosis not present

## 2021-03-07 MED ORDER — OXYCODONE HCL 5 MG PO TABS: 5.0000 mg | ORAL_TABLET | ORAL | 0 refills | Status: DC | PRN

## 2021-03-07 MED ORDER — METHOCARBAMOL 500 MG PO TABS: 500.0000 mg | ORAL_TABLET | Freq: Four times a day (QID) | ORAL | 1 refills | Status: DC | PRN

## 2021-03-07 MED ORDER — ASPIRIN 81 MG PO CHEW: 81.0000 mg | CHEWABLE_TABLET | Freq: Two times a day (BID) | ORAL | 0 refills | Status: DC

## 2021-03-07 NOTE — Progress Notes (Signed)
Physical Therapy Treatment Patient Details Name: Samuel [name] MRN: [id number] DOB: 1955-09-04 Today's Date: 03/07/2021   History of Present Illness Pt is 65 yo male s/p R TKA on 03/05/21.  Pt with hx including L TKA 10/09/20, arthritis, bil hearing loss, CAD, HTN, prior substance abuse-sober 3 years.    PT Comments    Pt admitted with above diagnosis. Pt was able to ambulate in hallway and complete steps with min guard to supervision assist.  Pt plans to go home with wife today and all education was completed this am.  Pt feels ready to go home.  Pt currently with functional limitations due to balance and endurance deficits. Pt will benefit from skilled PT to increase their independence and safety with mobility to allow discharge to the venue listed below.      Recommendations for follow up therapy are one component of a multi-disciplinary discharge planning process, led by the attending physician.  Recommendations may be updated based on patient status, additional functional criteria and insurance authorization.  Follow Up Recommendations  Follow surgeon's recommendation for DC plan and follow-up therapies     Equipment Recommendations  None recommended by PT (has RW and elevated toilet with grab bars)    Recommendations for Other Services       Precautions / Restrictions Precautions Precautions: Fall Required Braces or Orthoses: Knee Immobilizer - Right Restrictions Weight Bearing Restrictions: Yes RLE Weight Bearing: Weight bearing as tolerated     Mobility  Bed Mobility Overal bed mobility: Needs Assistance Bed Mobility: Supine to Sit;Sit to Supine     Supine to sit: Supervision     General bed mobility comments: No assist to come to eOB just incr time for pt to move.  Pt able to move his right LE on his own.    Transfers Overall transfer level: Needs assistance Equipment used: Rolling walker (2 wheeled) Transfers: Sit to/from Stand Sit to Stand: Min guard          General transfer comment: cues for hand placement. pt was able to stand and support himself with some pain but no LOB  Ambulation/Gait Ambulation/Gait assistance: Min guard;Supervision Gait Distance (Feet): 200 Feet Assistive device: Rolling walker (2 wheeled) Gait Pattern/deviations: Step-to pattern;Decreased stance time - right;Decreased weight shift to right;Antalgic   Gait velocity interpretation: <1.8 ft/sec, indicate of risk for recurrent falls General Gait Details: Pt able to progress ambulation to hallway with good sequencing of steps and rW.   Stairs Stairs: Yes Stairs assistance: Min guard;Min assist Stair Management: One rail Right;Step to pattern;Sideways;Backwards;With walker Number of Stairs: 2 General stair comments: Pt was unable to ascend and descend steps going up with rail sideways as his right Le was in pain. Pt was able to ascend and descend steps with the RW backwards.  Given handout and pt reports he can instruct wife.   Wheelchair Mobility    Modified Rankin (Stroke Patients Only)       Balance Overall balance assessment: Needs assistance Sitting-balance support: No upper extremity supported;Feet supported Sitting balance-Leahy Scale: Fair     Standing balance support: Bilateral upper extremity supported;During functional activity Standing balance-Leahy Scale: Poor Standing balance comment: relies on UE support                            Cognition Arousal/Alertness: Awake/alert Behavior During Therapy: WFL for tasks assessed/performed Overall Cognitive Status: Within Functional Limits for tasks assessed  Exercises Total Joint Exercises Ankle Circles/Pumps: AROM;Both;10 reps;Supine Quad Sets: AROM;Both;10 reps;Supine Heel Slides: AAROM;Both;10 reps;Supine Straight Leg Raises: AAROM;Both;10 reps;Supine Long Arc Quad: AAROM;Both;5 reps;Seated Knee Flexion: AAROM;Both;5  reps;Seated Goniometric ROM: 6-78    General Comments        Pertinent Vitals/Pain Pain Assessment: Faces Faces Pain Scale: Hurts even more Pain Location: R knee Pain Descriptors / Indicators: Grimacing;Restless;Sharp;Other (Comment) (deep) Pain Intervention(s): Limited activity within patient's tolerance;Monitored during session;Repositioned;Premedicated before session    Home Living                      Prior Function            PT Goals (current goals can now be found in the care plan section) Acute Rehab PT Goals Patient Stated Goal: decrease pain Progress towards PT goals: Progressing toward goals    Frequency    7X/week      PT Plan Current plan remains appropriate    Co-evaluation              AM-PAC PT "6 Clicks" Mobility   Outcome Measure  Help needed turning from your back to your side while in a flat bed without using bedrails?: A Little Help needed moving from lying on your back to sitting on the side of a flat bed without using bedrails?: A Little Help needed moving to and from a bed to a chair (including a wheelchair)?: A Little Help needed standing up from a chair using your arms (e.g., wheelchair or bedside chair)?: A Little Help needed to walk in hospital room?: A Little Help needed climbing 3-5 steps with a railing? : A Little 6 Click Score: 18    End of Session Equipment Utilized During Treatment: Gait belt;Right knee immobilizer Activity Tolerance: Patient limited by pain;Patient limited by fatigue Patient left: with call bell/phone within reach;in chair;with chair alarm set Nurse Communication: Mobility status PT Visit Diagnosis: Other abnormalities of gait and mobility (R26.89);Muscle weakness (generalized) (M62.81);Pain Pain - Right/Left: Right Pain - part of body: Knee     Time: 0825-0900 PT Time Calculation (min) (ACUTE ONLY): 35 min  Charges:  $Gait Training: 8-22 mins $Therapeutic Exercise: 8-22 mins                      Glorimar Stroope M,PT Acute Rehab Services [phone] (pager)    Alvira Philips 03/07/2021, 10:39 AM

## 2021-03-07 NOTE — Progress Notes (Signed)
Patient ID: Samuel [name], male   DOB: 1956/03/31, 65 y.o.   MRN: [id number] No acute changes.  Doing well overall.  Knee stable.  Can be discharged to home today.

## 2021-03-07 NOTE — Progress Notes (Signed)
Pt discharged to home with wife via wheelchair with all belongings.

## 2021-03-07 NOTE — Progress Notes (Signed)
Discharge instructions given to pt. Pt verbalized understanding of all teaching. Pt currently waiting in room for wife to come pick him up.

## 2021-03-07 NOTE — Discharge Summary (Signed)
Patient ID: Samuel [name] MRN: [id number] DOB/AGE: 1956-06-01 65 y.o.  Admit date: 03/05/2021 Discharge date: 03/07/2021  Admission Diagnoses:  Principal Problem:   Unilateral primary osteoarthritis, right knee Active Problems:   Status post total right knee replacement   Discharge Diagnoses:  Same  Past Medical History:  Diagnosis Date   Anxiety    Arthritis    Basal cell carcinoma 04/29/2018   upper mid back, right mid back    Basal cell carcinoma 04/29/2018   right mid back    Basal cell carcinoma 06/10/2018   left lover back    Bronchitis    Coronary artery disease    Mild-moderate non-obstructive CAD 06/15/17 (20% dLM, 50% mLAD, 30% OM3, 40% mRCA, 40% dRCA)   COVID-19 unknown   Depression    Former heavy cigarette smoker (20-39 per day) 03/05/2018   Quit 2000; 60 pak year history   History of hiatal hernia    HTN (hypertension)    Substance abuse ([place])    sober for 3 years    Surgeries: Procedure(s): RIGHT TOTAL KNEE ARTHROPLASTY on 03/05/2021   Consultants:   Discharged Condition: Improved  Hospital Course: Samuel [name] is an 65 y.o. male who was admitted 03/05/2021 for operative treatment ofUnilateral primary osteoarthritis, right knee. Patient has severe unremitting pain that affects sleep, daily activities, and work/hobbies. After pre-op clearance the patient was taken to the operating room on 03/05/2021 and underwent  Procedure(s): RIGHT TOTAL KNEE ARTHROPLASTY.    Patient was given perioperative antibiotics:  Anti-infectives (From admission, onward)    Start     Dose/Rate Route Frequency Ordered Stop   03/05/21 1830  ceFAZolin (ANCEF) IVPB 1 g/50 mL premix        1 g 100 mL/hr over 30 Minutes Intravenous Every 6 hours 03/05/21 1642 03/06/21 0222   03/05/21 1015  ceFAZolin (ANCEF) IVPB 2g/100 mL premix        2 g 200 mL/hr over 30 Minutes Intravenous On call to O.R. 03/05/21 1001 03/05/21 1224        Patient was given sequential compression  devices, early ambulation, and chemoprophylaxis to prevent DVT.  Patient benefited maximally from hospital stay and there were no complications.    Recent vital signs: Patient Vitals for the past 24 hrs:  BP Temp Temp src Pulse Resp SpO2  03/07/21 0753 123/65 98.1 F (36.7 C) Oral 89 18 97 %  03/07/21 0552 (!) 115/58 98.4 F (36.9 C) Oral 87 18 93 %  03/06/21 2049 (!) 135/58 98.6 F (37 C) Oral 83 16 100 %  03/06/21 1714 121/61 98.3 F (36.8 C) Oral 85 18 98 %     Recent laboratory studies:  Recent Labs    03/06/21 0205  WBC 14.4*  HGB 13.4  HCT 39.9  PLT 199  NA 136  K 4.4  CL 104  CO2 25  BUN 12  CREATININE 1.10  GLUCOSE 131*  CALCIUM 8.7*     Discharge Medications:   Allergies as of 03/07/2021   No Known Allergies      Medication List     TAKE these medications    acetaminophen 500 MG tablet Commonly known as: TYLENOL Take 1,000 mg by mouth every 6 (six) hours as needed for moderate pain.   amLODipine 5 MG tablet Commonly known as: NORVASC Take 1 tablet (5 mg total) by mouth daily.   aspirin 81 MG chewable tablet Chew 1 tablet (81 mg total) by mouth 2 (two) times daily.  FLUoxetine 20 MG capsule Commonly known as: PROZAC Take 1 capsule (20 mg total) by mouth daily.   methocarbamol 500 MG tablet Commonly known as: ROBAXIN Take 1 tablet (500 mg total) by mouth every 6 (six) hours as needed for muscle spasms.   oxyCODONE 5 MG immediate release tablet Commonly known as: Oxy IR/ROXICODONE Take 1-2 tablets (5-10 mg total) by mouth every 4 (four) hours as needed for moderate pain (pain score 4-6).   VITAMIN D PO Take 3 tablets by mouth daily.               Durable Medical Equipment  (From admission, onward)           Start     Ordered   03/05/21 1643  DME 3 n 1  Once        03/05/21 1642   03/05/21 1643  DME Walker rolling  Once       Question Answer Comment  Walker: With 5 Inch Wheels   Patient needs a walker to treat with the  following condition Status post right knee replacement      03/05/21 1642            Diagnostic Studies: DG Knee Right Port  Result Date: 03/05/2021 CLINICAL DATA:  Status post right knee replacement EXAM: PORTABLE RIGHT KNEE - 2 VIEW COMPARISON:  None. FINDINGS: Right knee replacement is noted. No acute fracture or dislocation is noted. No acute soft tissue or bony abnormality is seen. IMPRESSION: Status post right knee replacement. Electronically Signed   By: Inez Catalina M.D.   On: 03/05/2021 18:04    Disposition: Discharge disposition: 01-Home or Self Care          Follow-up Information     Mcarthur Rossetti, MD. Go on 03/19/2021.   Specialty: Orthopedic Surgery Why: at 10;00 am for your first in office post op appointment with Dr. Clarita Leber information: [place] [address] [phone]         [facility] Follow up.   Specialty: Physical Therapy Why: This appointment will be scheduled for you by your office RN Case manager. Contact information: [facility] [place] [address] [phone]                  Signed: Mcarthur Rossetti 03/07/2021, 8:16 AM

## 2021-03-08 ENCOUNTER — Telehealth: Payer: Self-pay | Admitting: *Deleted

## 2021-03-08 DIAGNOSIS — E02 Subclinical iodine-deficiency hypothyroidism: Secondary | ICD-10-CM | POA: Diagnosis not present

## 2021-03-08 DIAGNOSIS — F419 Anxiety disorder, unspecified: Secondary | ICD-10-CM | POA: Diagnosis not present

## 2021-03-08 DIAGNOSIS — I8393 Asymptomatic varicose veins of bilateral lower extremities: Secondary | ICD-10-CM | POA: Diagnosis not present

## 2021-03-08 DIAGNOSIS — I10 Essential (primary) hypertension: Secondary | ICD-10-CM | POA: Diagnosis not present

## 2021-03-08 DIAGNOSIS — I2510 Atherosclerotic heart disease of native coronary artery without angina pectoris: Secondary | ICD-10-CM | POA: Diagnosis not present

## 2021-03-08 DIAGNOSIS — Z471 Aftercare following joint replacement surgery: Secondary | ICD-10-CM | POA: Diagnosis not present

## 2021-03-08 DIAGNOSIS — H9193 Unspecified hearing loss, bilateral: Secondary | ICD-10-CM | POA: Diagnosis not present

## 2021-03-08 DIAGNOSIS — F32A Depression, unspecified: Secondary | ICD-10-CM | POA: Diagnosis not present

## 2021-03-08 DIAGNOSIS — E669 Obesity, unspecified: Secondary | ICD-10-CM | POA: Diagnosis not present

## 2021-03-08 NOTE — Telephone Encounter (Signed)
Ortho bundle D/C call attempted. No answer and left VM. 

## 2021-03-10 DIAGNOSIS — E02 Subclinical iodine-deficiency hypothyroidism: Secondary | ICD-10-CM | POA: Diagnosis not present

## 2021-03-10 DIAGNOSIS — F419 Anxiety disorder, unspecified: Secondary | ICD-10-CM | POA: Diagnosis not present

## 2021-03-10 DIAGNOSIS — I2510 Atherosclerotic heart disease of native coronary artery without angina pectoris: Secondary | ICD-10-CM | POA: Diagnosis not present

## 2021-03-10 DIAGNOSIS — I10 Essential (primary) hypertension: Secondary | ICD-10-CM | POA: Diagnosis not present

## 2021-03-10 DIAGNOSIS — Z471 Aftercare following joint replacement surgery: Secondary | ICD-10-CM | POA: Diagnosis not present

## 2021-03-10 DIAGNOSIS — E669 Obesity, unspecified: Secondary | ICD-10-CM | POA: Diagnosis not present

## 2021-03-10 DIAGNOSIS — I8393 Asymptomatic varicose veins of bilateral lower extremities: Secondary | ICD-10-CM | POA: Diagnosis not present

## 2021-03-10 DIAGNOSIS — H9193 Unspecified hearing loss, bilateral: Secondary | ICD-10-CM | POA: Diagnosis not present

## 2021-03-10 DIAGNOSIS — F32A Depression, unspecified: Secondary | ICD-10-CM | POA: Diagnosis not present

## 2021-03-11 ENCOUNTER — Telehealth: Payer: Self-pay | Admitting: Orthopaedic Surgery

## 2021-03-11 ENCOUNTER — Other Ambulatory Visit: Payer: Self-pay | Admitting: Orthopaedic Surgery

## 2021-03-11 MED ORDER — OXYCODONE HCL 5 MG PO TABS: 5.0000 mg | ORAL_TABLET | ORAL | 0 refills | Status: DC | PRN

## 2021-03-11 NOTE — Telephone Encounter (Signed)
Please advise 

## 2021-03-11 NOTE — Telephone Encounter (Signed)
Pt would like a refill on oxycodone

## 2021-03-12 ENCOUNTER — Telehealth: Payer: Self-pay | Admitting: *Deleted

## 2021-03-12 ENCOUNTER — Telehealth: Payer: Self-pay | Admitting: Orthopaedic Surgery

## 2021-03-12 DIAGNOSIS — E669 Obesity, unspecified: Secondary | ICD-10-CM | POA: Diagnosis not present

## 2021-03-12 DIAGNOSIS — F32A Depression, unspecified: Secondary | ICD-10-CM | POA: Diagnosis not present

## 2021-03-12 DIAGNOSIS — Z471 Aftercare following joint replacement surgery: Secondary | ICD-10-CM | POA: Diagnosis not present

## 2021-03-12 DIAGNOSIS — I10 Essential (primary) hypertension: Secondary | ICD-10-CM | POA: Diagnosis not present

## 2021-03-12 DIAGNOSIS — H9193 Unspecified hearing loss, bilateral: Secondary | ICD-10-CM | POA: Diagnosis not present

## 2021-03-12 DIAGNOSIS — F419 Anxiety disorder, unspecified: Secondary | ICD-10-CM | POA: Diagnosis not present

## 2021-03-12 DIAGNOSIS — I2510 Atherosclerotic heart disease of native coronary artery without angina pectoris: Secondary | ICD-10-CM | POA: Diagnosis not present

## 2021-03-12 DIAGNOSIS — E02 Subclinical iodine-deficiency hypothyroidism: Secondary | ICD-10-CM | POA: Diagnosis not present

## 2021-03-12 DIAGNOSIS — I8393 Asymptomatic varicose veins of bilateral lower extremities: Secondary | ICD-10-CM | POA: Diagnosis not present

## 2021-03-12 NOTE — Telephone Encounter (Signed)
Pt calling stating he has follow up questions concerning his post op surg, surg happened 03/05/21. Lots of swelling and wants to know if there is anything he can do to help decrease it. Pt wanted to speak with the surg sch'er about the post op questions but advised I will send it to his assistant as well. The best call back number is [phone].

## 2021-03-12 NOTE — Telephone Encounter (Signed)
I called and talked to the pt. He stated he over did it yesterday and is having leg swelling. His therapist is coming out today and he was going to have them look at it but wanted to make sure this was normal. I advised him it was. He was instructed to ice and elevate. He stated understanding

## 2021-03-12 NOTE — Telephone Encounter (Signed)
Ortho bundle D/C and 7 day call completed.

## 2021-03-14 ENCOUNTER — Telehealth: Payer: Self-pay | Admitting: Orthopaedic Surgery

## 2021-03-14 ENCOUNTER — Other Ambulatory Visit: Payer: Self-pay | Admitting: Orthopaedic Surgery

## 2021-03-14 DIAGNOSIS — F32A Depression, unspecified: Secondary | ICD-10-CM | POA: Diagnosis not present

## 2021-03-14 DIAGNOSIS — F419 Anxiety disorder, unspecified: Secondary | ICD-10-CM | POA: Diagnosis not present

## 2021-03-14 DIAGNOSIS — I2510 Atherosclerotic heart disease of native coronary artery without angina pectoris: Secondary | ICD-10-CM | POA: Diagnosis not present

## 2021-03-14 DIAGNOSIS — H9193 Unspecified hearing loss, bilateral: Secondary | ICD-10-CM | POA: Diagnosis not present

## 2021-03-14 DIAGNOSIS — I8393 Asymptomatic varicose veins of bilateral lower extremities: Secondary | ICD-10-CM | POA: Diagnosis not present

## 2021-03-14 DIAGNOSIS — Z471 Aftercare following joint replacement surgery: Secondary | ICD-10-CM | POA: Diagnosis not present

## 2021-03-14 DIAGNOSIS — E02 Subclinical iodine-deficiency hypothyroidism: Secondary | ICD-10-CM | POA: Diagnosis not present

## 2021-03-14 DIAGNOSIS — E669 Obesity, unspecified: Secondary | ICD-10-CM | POA: Diagnosis not present

## 2021-03-14 DIAGNOSIS — I10 Essential (primary) hypertension: Secondary | ICD-10-CM | POA: Diagnosis not present

## 2021-03-14 MED ORDER — OXYCODONE HCL 5 MG PO TABS: 5.0000 mg | ORAL_TABLET | ORAL | 0 refills | Status: DC | PRN

## 2021-03-14 NOTE — Telephone Encounter (Signed)
Please advise 

## 2021-03-14 NOTE — Telephone Encounter (Signed)
Patient called. He would like oxycodone called in for pain. His call back number is [phone]

## 2021-03-16 DIAGNOSIS — I10 Essential (primary) hypertension: Secondary | ICD-10-CM | POA: Diagnosis not present

## 2021-03-16 DIAGNOSIS — Z471 Aftercare following joint replacement surgery: Secondary | ICD-10-CM | POA: Diagnosis not present

## 2021-03-16 DIAGNOSIS — F419 Anxiety disorder, unspecified: Secondary | ICD-10-CM | POA: Diagnosis not present

## 2021-03-16 DIAGNOSIS — I2510 Atherosclerotic heart disease of native coronary artery without angina pectoris: Secondary | ICD-10-CM | POA: Diagnosis not present

## 2021-03-16 DIAGNOSIS — E02 Subclinical iodine-deficiency hypothyroidism: Secondary | ICD-10-CM | POA: Diagnosis not present

## 2021-03-16 DIAGNOSIS — I8393 Asymptomatic varicose veins of bilateral lower extremities: Secondary | ICD-10-CM | POA: Diagnosis not present

## 2021-03-16 DIAGNOSIS — E669 Obesity, unspecified: Secondary | ICD-10-CM | POA: Diagnosis not present

## 2021-03-16 DIAGNOSIS — F32A Depression, unspecified: Secondary | ICD-10-CM | POA: Diagnosis not present

## 2021-03-16 DIAGNOSIS — H9193 Unspecified hearing loss, bilateral: Secondary | ICD-10-CM | POA: Diagnosis not present

## 2021-03-17 DIAGNOSIS — F419 Anxiety disorder, unspecified: Secondary | ICD-10-CM | POA: Diagnosis not present

## 2021-03-17 DIAGNOSIS — H9193 Unspecified hearing loss, bilateral: Secondary | ICD-10-CM | POA: Diagnosis not present

## 2021-03-17 DIAGNOSIS — I8393 Asymptomatic varicose veins of bilateral lower extremities: Secondary | ICD-10-CM | POA: Diagnosis not present

## 2021-03-17 DIAGNOSIS — F32A Depression, unspecified: Secondary | ICD-10-CM | POA: Diagnosis not present

## 2021-03-17 DIAGNOSIS — Z471 Aftercare following joint replacement surgery: Secondary | ICD-10-CM | POA: Diagnosis not present

## 2021-03-17 DIAGNOSIS — I2510 Atherosclerotic heart disease of native coronary artery without angina pectoris: Secondary | ICD-10-CM | POA: Diagnosis not present

## 2021-03-17 DIAGNOSIS — E669 Obesity, unspecified: Secondary | ICD-10-CM | POA: Diagnosis not present

## 2021-03-17 DIAGNOSIS — I10 Essential (primary) hypertension: Secondary | ICD-10-CM | POA: Diagnosis not present

## 2021-03-17 DIAGNOSIS — E02 Subclinical iodine-deficiency hypothyroidism: Secondary | ICD-10-CM | POA: Diagnosis not present

## 2021-03-18 ENCOUNTER — Other Ambulatory Visit: Payer: Self-pay | Admitting: Orthopaedic Surgery

## 2021-03-18 ENCOUNTER — Telehealth: Payer: Self-pay | Admitting: Orthopaedic Surgery

## 2021-03-18 DIAGNOSIS — R32 Unspecified urinary incontinence: Secondary | ICD-10-CM | POA: Diagnosis not present

## 2021-03-18 MED ORDER — OXYCODONE HCL 5 MG PO TABS: 5.0000 mg | ORAL_TABLET | Freq: Four times a day (QID) | ORAL | 0 refills | Status: DC | PRN

## 2021-03-18 NOTE — Telephone Encounter (Signed)
Please advise 

## 2021-03-18 NOTE — Telephone Encounter (Signed)
Pt called requesting a refill of pain medication. Please send to pharmacy on file. Pt phone number is [phone].

## 2021-03-19 ENCOUNTER — Encounter: Payer: Self-pay | Admitting: Orthopaedic Surgery

## 2021-03-19 ENCOUNTER — Ambulatory Visit (INDEPENDENT_AMBULATORY_CARE_PROVIDER_SITE_OTHER): Payer: Medicare HMO | Admitting: Orthopaedic Surgery

## 2021-03-19 DIAGNOSIS — Z96651 Presence of right artificial knee joint: Secondary | ICD-10-CM

## 2021-03-19 NOTE — Progress Notes (Signed)
HPI: Mr. Tippin returns today status post right total knee arthroplasty.  He is overall doing well.  No new shortness of breath fevers chills.  He is on aspirin 81 mg once daily only.  He is taking no aspirin prior to surgery.  He is still taking oxycodone for the pain.  He is ready to transition to outpatient therapy.  Review of systems: See HPI otherwise negative  Physical exam: General well-developed well-nourished male no acute distress ambulates with a cane with slight antalgic gait.  Right knee surgical incisions healing well no signs of infection.  He has full extension and flexion to approximately 70 degrees.  Calf supple and nontender.  Impression: Status post right total knee arthroplasty 03/05/2021  Plan: Recommend transition to outpatient therapy prescriptions given.  He will stay on 81 mg aspirin once daily for another week and then discontinue as he was on no aspirin prior to surgery.  Staples removed Steri-Strips applied.  Scar tissue mobilization encouraged.  We will see him back in 1 month sooner if there is any questions concerns.

## 2021-03-20 ENCOUNTER — Telehealth: Payer: Self-pay | Admitting: *Deleted

## 2021-03-20 NOTE — Telephone Encounter (Signed)
Attempted call to patient; left VM.

## 2021-03-21 ENCOUNTER — Other Ambulatory Visit: Payer: Self-pay | Admitting: Orthopaedic Surgery

## 2021-03-21 ENCOUNTER — Telehealth: Payer: Self-pay | Admitting: Orthopaedic Surgery

## 2021-03-21 DIAGNOSIS — F411 Generalized anxiety disorder: Secondary | ICD-10-CM

## 2021-03-21 MED ORDER — FLUOXETINE HCL 20 MG PO CAPS: 20.0000 mg | ORAL_CAPSULE | Freq: Every day | ORAL | 1 refills | Status: DC

## 2021-03-21 NOTE — Telephone Encounter (Signed)
I called and advised pt

## 2021-03-21 NOTE — Telephone Encounter (Signed)
Patient called asked if Dr. Ninfa Linden would fill his Rx for Prozac until he can get another primary care physician.. Per patient Dr Junius Roads was his primary care doctor.  The number to contact patient is [phone]

## 2021-03-22 ENCOUNTER — Telehealth: Payer: Self-pay | Admitting: *Deleted

## 2021-03-22 NOTE — Telephone Encounter (Signed)
14 day Ortho bundle call completed.  

## 2021-03-25 ENCOUNTER — Other Ambulatory Visit: Payer: Self-pay | Admitting: Physician Assistant

## 2021-03-25 ENCOUNTER — Telehealth: Payer: Self-pay | Admitting: Orthopaedic Surgery

## 2021-03-25 DIAGNOSIS — M25561 Pain in right knee: Secondary | ICD-10-CM | POA: Diagnosis not present

## 2021-03-25 DIAGNOSIS — M25661 Stiffness of right knee, not elsewhere classified: Secondary | ICD-10-CM | POA: Diagnosis not present

## 2021-03-25 DIAGNOSIS — Z96651 Presence of right artificial knee joint: Secondary | ICD-10-CM | POA: Diagnosis not present

## 2021-03-25 DIAGNOSIS — M6281 Muscle weakness (generalized): Secondary | ICD-10-CM | POA: Diagnosis not present

## 2021-03-25 MED ORDER — OXYCODONE HCL 5 MG PO TABS: 5.0000 mg | ORAL_TABLET | Freq: Four times a day (QID) | ORAL | 0 refills | Status: DC | PRN

## 2021-03-25 NOTE — Telephone Encounter (Signed)
Pt would like get a refill on oxycodone. He starts physical therapy this week

## 2021-03-27 DIAGNOSIS — M25561 Pain in right knee: Secondary | ICD-10-CM | POA: Diagnosis not present

## 2021-03-27 DIAGNOSIS — M6281 Muscle weakness (generalized): Secondary | ICD-10-CM | POA: Diagnosis not present

## 2021-03-27 DIAGNOSIS — Z96651 Presence of right artificial knee joint: Secondary | ICD-10-CM | POA: Diagnosis not present

## 2021-03-27 DIAGNOSIS — M25661 Stiffness of right knee, not elsewhere classified: Secondary | ICD-10-CM | POA: Diagnosis not present

## 2021-03-29 ENCOUNTER — Telehealth: Payer: Self-pay | Admitting: Orthopaedic Surgery

## 2021-03-29 ENCOUNTER — Other Ambulatory Visit: Payer: Self-pay | Admitting: Orthopaedic Surgery

## 2021-03-29 MED ORDER — OXYCODONE HCL 5 MG PO TABS: 5.0000 mg | ORAL_TABLET | Freq: Four times a day (QID) | ORAL | 0 refills | Status: DC | PRN

## 2021-03-29 NOTE — Telephone Encounter (Signed)
Pt called requesting a refill of pain medication. Please send to pharmacy on file. Pt phone number is [phone].

## 2021-03-29 NOTE — Telephone Encounter (Signed)
Please advise 

## 2021-04-02 ENCOUNTER — Other Ambulatory Visit: Payer: Self-pay | Admitting: Orthopaedic Surgery

## 2021-04-02 NOTE — Telephone Encounter (Signed)
ok 

## 2021-04-03 DIAGNOSIS — M6281 Muscle weakness (generalized): Secondary | ICD-10-CM | POA: Diagnosis not present

## 2021-04-03 DIAGNOSIS — Z96651 Presence of right artificial knee joint: Secondary | ICD-10-CM | POA: Diagnosis not present

## 2021-04-03 DIAGNOSIS — M25661 Stiffness of right knee, not elsewhere classified: Secondary | ICD-10-CM | POA: Diagnosis not present

## 2021-04-03 DIAGNOSIS — M25561 Pain in right knee: Secondary | ICD-10-CM | POA: Diagnosis not present

## 2021-04-04 ENCOUNTER — Telehealth: Payer: Self-pay | Admitting: Orthopaedic Surgery

## 2021-04-04 ENCOUNTER — Other Ambulatory Visit: Payer: Self-pay | Admitting: Orthopaedic Surgery

## 2021-04-04 MED ORDER — OXYCODONE HCL 5 MG PO TABS: 5.0000 mg | ORAL_TABLET | Freq: Four times a day (QID) | ORAL | 0 refills | Status: DC | PRN

## 2021-04-04 NOTE — Telephone Encounter (Signed)
Please advise 

## 2021-04-04 NOTE — Telephone Encounter (Signed)
Pt called requesting pain medication. Please send refill to pharmacy on file. Pt phone number is [phone].

## 2021-04-08 DIAGNOSIS — M25561 Pain in right knee: Secondary | ICD-10-CM | POA: Diagnosis not present

## 2021-04-08 DIAGNOSIS — Z96651 Presence of right artificial knee joint: Secondary | ICD-10-CM | POA: Diagnosis not present

## 2021-04-08 DIAGNOSIS — M6281 Muscle weakness (generalized): Secondary | ICD-10-CM | POA: Diagnosis not present

## 2021-04-08 DIAGNOSIS — M25661 Stiffness of right knee, not elsewhere classified: Secondary | ICD-10-CM | POA: Diagnosis not present

## 2021-04-10 DIAGNOSIS — M25661 Stiffness of right knee, not elsewhere classified: Secondary | ICD-10-CM | POA: Diagnosis not present

## 2021-04-10 DIAGNOSIS — M6281 Muscle weakness (generalized): Secondary | ICD-10-CM | POA: Diagnosis not present

## 2021-04-10 DIAGNOSIS — Z96651 Presence of right artificial knee joint: Secondary | ICD-10-CM | POA: Diagnosis not present

## 2021-04-10 DIAGNOSIS — M25561 Pain in right knee: Secondary | ICD-10-CM | POA: Diagnosis not present

## 2021-04-12 ENCOUNTER — Telehealth: Payer: Self-pay | Admitting: Orthopaedic Surgery

## 2021-04-12 ENCOUNTER — Other Ambulatory Visit: Payer: Self-pay | Admitting: Orthopaedic Surgery

## 2021-04-12 MED ORDER — OXYCODONE HCL 5 MG PO TABS: 5.0000 mg | ORAL_TABLET | Freq: Four times a day (QID) | ORAL | 0 refills | Status: DC | PRN

## 2021-04-12 NOTE — Telephone Encounter (Signed)
Patient called. He would like a refill on oxycodone. His call back number is [phone]

## 2021-04-17 ENCOUNTER — Encounter: Payer: Self-pay | Admitting: Family Medicine

## 2021-04-17 DIAGNOSIS — M48061 Spinal stenosis, lumbar region without neurogenic claudication: Secondary | ICD-10-CM

## 2021-04-17 DIAGNOSIS — M0088 Arthritis due to other bacteria, vertebrae: Secondary | ICD-10-CM

## 2021-04-17 DIAGNOSIS — G8929 Other chronic pain: Secondary | ICD-10-CM

## 2021-04-22 DIAGNOSIS — M25661 Stiffness of right knee, not elsewhere classified: Secondary | ICD-10-CM | POA: Diagnosis not present

## 2021-04-22 DIAGNOSIS — Z96651 Presence of right artificial knee joint: Secondary | ICD-10-CM | POA: Diagnosis not present

## 2021-04-22 DIAGNOSIS — M25561 Pain in right knee: Secondary | ICD-10-CM | POA: Diagnosis not present

## 2021-04-22 DIAGNOSIS — M6281 Muscle weakness (generalized): Secondary | ICD-10-CM | POA: Diagnosis not present

## 2021-04-23 ENCOUNTER — Telehealth: Payer: Self-pay | Admitting: *Deleted

## 2021-04-23 NOTE — Telephone Encounter (Signed)
Pt called stating he cannon get into Dr. Joycelyn Schmid office until January and he would like to go back to [facility] for another injection. Pt states he cannot wait until January.

## 2021-04-25 DIAGNOSIS — Z96651 Presence of right artificial knee joint: Secondary | ICD-10-CM | POA: Diagnosis not present

## 2021-04-25 DIAGNOSIS — M25661 Stiffness of right knee, not elsewhere classified: Secondary | ICD-10-CM | POA: Diagnosis not present

## 2021-04-25 DIAGNOSIS — M6281 Muscle weakness (generalized): Secondary | ICD-10-CM | POA: Diagnosis not present

## 2021-04-25 DIAGNOSIS — M25561 Pain in right knee: Secondary | ICD-10-CM | POA: Diagnosis not present

## 2021-04-25 NOTE — Telephone Encounter (Signed)
Please schedule a follow-up appoint with me.  [name] I want to make sure we order the right injection and try to do this over the phone is not working..  I have about 5 or 6 different options for injection location and its not clear based on my review of the chart over the last 2 years which ones have actually worked and not worked and you only have a limited amount of injections that we can do it is a period of time and it is essential that we get it right.  Samuel [name]

## 2021-04-25 NOTE — Telephone Encounter (Signed)
Called pt and LM for him to call the office to schedule a f/u visit w/ Dr. Georgina Snell per Dr.Corey's request.

## 2021-04-26 ENCOUNTER — Other Ambulatory Visit: Payer: Self-pay | Admitting: Orthopaedic Surgery

## 2021-04-26 ENCOUNTER — Telehealth: Payer: Self-pay | Admitting: Orthopaedic Surgery

## 2021-04-26 MED ORDER — OXYCODONE HCL 5 MG PO TABS: 5.0000 mg | ORAL_TABLET | Freq: Four times a day (QID) | ORAL | 0 refills | Status: DC | PRN

## 2021-04-26 NOTE — Telephone Encounter (Signed)
Please advise 

## 2021-04-26 NOTE — Telephone Encounter (Signed)
Patient called needing Rx refilled  Oxycodone. The number to contact patient is [phone]

## 2021-05-01 DIAGNOSIS — M25561 Pain in right knee: Secondary | ICD-10-CM | POA: Diagnosis not present

## 2021-05-01 DIAGNOSIS — Z96651 Presence of right artificial knee joint: Secondary | ICD-10-CM | POA: Diagnosis not present

## 2021-05-01 DIAGNOSIS — M25661 Stiffness of right knee, not elsewhere classified: Secondary | ICD-10-CM | POA: Diagnosis not present

## 2021-05-01 DIAGNOSIS — M6281 Muscle weakness (generalized): Secondary | ICD-10-CM | POA: Diagnosis not present

## 2021-05-03 DIAGNOSIS — M25661 Stiffness of right knee, not elsewhere classified: Secondary | ICD-10-CM | POA: Diagnosis not present

## 2021-05-03 DIAGNOSIS — M6281 Muscle weakness (generalized): Secondary | ICD-10-CM | POA: Diagnosis not present

## 2021-05-03 DIAGNOSIS — M25561 Pain in right knee: Secondary | ICD-10-CM | POA: Diagnosis not present

## 2021-05-03 DIAGNOSIS — Z96651 Presence of right artificial knee joint: Secondary | ICD-10-CM | POA: Diagnosis not present

## 2021-05-06 DIAGNOSIS — M25561 Pain in right knee: Secondary | ICD-10-CM | POA: Diagnosis not present

## 2021-05-06 DIAGNOSIS — Z96651 Presence of right artificial knee joint: Secondary | ICD-10-CM | POA: Diagnosis not present

## 2021-05-06 DIAGNOSIS — M6281 Muscle weakness (generalized): Secondary | ICD-10-CM | POA: Diagnosis not present

## 2021-05-06 DIAGNOSIS — M25661 Stiffness of right knee, not elsewhere classified: Secondary | ICD-10-CM | POA: Diagnosis not present

## 2021-05-06 NOTE — Progress Notes (Signed)
I, Samuel [name], LAT, ATC, am serving as scribe for Samuel [name].  Samuel [name] is a 65 y.o. male who presents to [facility] at [facility] today for f/u of chronic LBP.  He was last seen by Samuel [name] on 11/29/20 and noted "terrible" back pain.  He had B SIJ injections at his last visit.  He's had several prior lumbar ESI, L L3-4 ESI on 12/26/20, L L3-4 epidural injection on 12/01/19 and 04/09/20 and repeat ESI on 05/14/20 (B L4-5).  He's also seen Samuel [name] on 02/01/21 and was re-referred to see him again.  Additionally, he had a R TKR on 03/05/21 with Samuel [name].  Today, pt reports LBP flared up again right before he went in for the TKR. Pt notes the LBP is to the point where it's effecting PT for his knee. Pt has been procrastinating seeking treatment because he was dealing w/ rehabing his R knee.   Diagnostic imaging: R hip XR-08/15/20; L-spine MRI- 11/19/19; L-spine XR- 05/02/19  Pertinent review of systems: No fevers or chills  Relevant historical information: Total knee replacement September 20. Felt that the epidural steroid injection he had on 12/26/20 works pretty well to help his pain.   Exam:  BP 118/62   Pulse 62   Ht 5\' 7"  (1.702 m)   Wt 233 lb (105.7 kg)   SpO2 99%   BMI 36.49 kg/m  General: Well Developed, well nourished, and in no acute distress.   MSK: L-spine normal. Nontender midline.  Decreased lumbar motion. Lower extremity strength is intact. Reflexes are intact.    Lab and Radiology Results  EXAM: MRI LUMBAR SPINE WITHOUT CONTRAST   TECHNIQUE: Multiplanar, multisequence MR imaging of the lumbar spine was performed. No intravenous contrast was administered.   COMPARISON:  Plain films May 02, 2019   FINDINGS: Segmentation:  Standard.   Alignment:  Physiologic.   Vertebrae: No fracture, evidence of discitis, or bone lesion. Mild diffuse decrease of the T1 signal throughout the visualized spine, nonspecific, may represent  red marrow reconversion.   Conus medullaris and cauda equina: Conus extends to the L1 level. Conus and cauda equina appear normal.   Paraspinal and other soft tissues: Negative.   Disc levels:   T12-L1: No spinal canal or neural foraminal stenosis.   L1-2: No spinal canal or neural foraminal stenosis.   L2-3: Mild loss of disc height, mild facet degenerative changes with bilateral joint effusion and ligamentum flavum redundancy, resulting in mild spinal canal stenosis and mild bilateral neural foraminal narrowing.   L3-4: Disc bulge with superimposed small left foraminal central disc protrusion abutting the exiting left L3 nerve root. There also facet degenerative change ligamentum flavum redundancy contributing for mild to moderate spinal canal stenosis, narrowing of the subarticular zones and mild bilateral neural foraminal narrowing.   L4-5: Disc bulge, prominent facet degenerative changes ligamentum flavum redundancy resulting in severe spinal canal stenosis and mild bilateral neural foraminal narrowing.   L5-S1: Mild facet degenerative changes. No spinal canal or neural foraminal stenosis.   IMPRESSION: 1. Lumbar spondylosis most pronounced at L4-5 where there is severe spinal canal stenosis and mild bilateral neural foraminal narrowing. 2. At L3-4 there is a small left foraminal central disc protrusion abutting the exiting left L3 nerve root. There is also mild to moderate spinal canal stenosis, narrowing of the subarticular zones and mild bilateral neural foraminal narrowing at this level. 3. Mild spinal canal stenosis and mild bilateral neural foraminal narrowing at  L2-3. 4. Mild diffuse decrease of the T1 signal throughout the visualized spine, nonspecific, may represent red marrow reconversion.     Electronically Signed   By: Samuel [name] M.D.   On: 11/21/2019 09:08 I, Samuel [name], personally (independently) visualized and performed the  interpretation of the images attached in this note.    CLINICAL DATA:  65 year old male with lumbosacral spondylosis without myelopathy. He has bilateral lower back pain radiating into both legs. Patient has previously undergone L3-L4 epidural steroid injection with good symptom relief. More recently, he underwent bilateral facet injections which he found less helpful. He presents for repeat L3-L4 ESI.   FLUOROSCOPY TIME:  Dictate in minutes and seconds   PROCEDURE: The procedure, risks, benefits, and alternatives were explained to the patient. Questions regarding the procedure were encouraged and answered. The patient understands and consents to the procedure.   LUMBAR EPIDURAL INJECTION:   An interlaminar approach was performed on left at L3-L4. The overlying skin was cleansed and anesthetized. A 20 gauge epidural needle was advanced using loss-of-resistance technique.   DIAGNOSTIC EPIDURAL INJECTION:   Injection of Isovue-M 200 shows a good epidural pattern with spread above and below the level of needle placement, primarily on the left no vascular opacification is seen.   THERAPEUTIC EPIDURAL INJECTION:   80 mg of Depo-Medrol mixed with 2 mL 1% lidocaine were instilled. The procedure was well-tolerated, and the patient was discharged thirty minutes following the injection in good condition.   COMPLICATIONS: None immediate   IMPRESSION: Technically successful epidural injection on the left L3-L4.     Electronically Signed   By: Samuel [name] M.D.   On: 12/26/2020 10:55     Assessment and Plan: 65 y.o. male with low back pain with minimal right-sided lumbar radiculopathy. Patient has had chronic low back pain for years now.  This is an exacerbation of his chronic issue.  I had an extensive discussion with Samuel [name] about which injections in the past has worked best for him.  He notes that the epidural steroid injection he had most recently in July of this year  worked really well.  Plan to repeat this injection even though his main issue is chronic back pain and less radiculopathy.  His facet injections in the past have not worked all that well.  If needed certainly could refer to [facility].  Additionally I have discussed with his orthopedic surgeon Samuel [name].  Okay to proceed with epidural steroid injection now 2 months out from his total knee replacement.   PDMP not reviewed this encounter. Orders Placed This Encounter  Procedures   DG INJECT DIAG/THERA/INC NEEDLE/CATH/PLC EPI/LUMB/SAC W/IMG    LUMB EPI #2 HUMANA/MD PFFS 242 LBS PACS (11/19/19) NO THINS/OTC    Standing Status:   Future    Standing Expiration Date:   05/07/2022    Order Specific Question:   Reason for Exam (SYMPTOM  OR DIAGNOSIS REQUIRED)    Answer:   Rt low back and leg pain. Level and technique per radiology. Last inj worked well    Order Specific Question:   Preferred Imaging Location?    Answer:   [id number]    Order Specific Question:   Radiology Contrast Protocol - do NOT remove file path    Answer:   [email or web address]   No orders of the defined types were placed in this encounter.    Discussed warning signs or symptoms. Please see discharge instructions. Patient expresses understanding.   The above documentation  has been reviewed and is accurate and complete Samuel [name], M.D.

## 2021-05-07 ENCOUNTER — Telehealth: Payer: Self-pay | Admitting: *Deleted

## 2021-05-07 ENCOUNTER — Telehealth: Payer: Self-pay | Admitting: Family Medicine

## 2021-05-07 ENCOUNTER — Ambulatory Visit (INDEPENDENT_AMBULATORY_CARE_PROVIDER_SITE_OTHER): Payer: Medicare HMO | Admitting: Family Medicine

## 2021-05-07 ENCOUNTER — Other Ambulatory Visit: Payer: Self-pay

## 2021-05-07 VITALS — BP 118/62 | HR 62 | Ht 67.0 in | Wt 233.0 lb

## 2021-05-07 DIAGNOSIS — M545 Low back pain, unspecified: Secondary | ICD-10-CM | POA: Diagnosis not present

## 2021-05-07 DIAGNOSIS — M48061 Spinal stenosis, lumbar region without neurogenic claudication: Secondary | ICD-10-CM | POA: Diagnosis not present

## 2021-05-07 DIAGNOSIS — G8929 Other chronic pain: Secondary | ICD-10-CM

## 2021-05-07 NOTE — Telephone Encounter (Signed)
Ortho bundle 30 day call and survey completed. ?

## 2021-05-07 NOTE — Telephone Encounter (Signed)
I spoke with Dr Ninfa Linden. It is ok to schedule the Epidural now. Please call [phone] to schedule it.

## 2021-05-07 NOTE — Telephone Encounter (Signed)
Spoke to patient. Epidural has been scheduled.

## 2021-05-07 NOTE — Patient Instructions (Addendum)
Thank you for coming in today.   I've referred you to [facility] for another epidural but I want you to wait to schedule until I contact Dr. Ninfa Linden.  Once we contact you and let you know that it's ok to schedule, please call [phone] to schedule your epidural.  Follow-up: as needed

## 2021-05-07 NOTE — Telephone Encounter (Signed)
Called pt and left detailed message stating that it is ok for him to schedule his ESI at his convenience.

## 2021-05-08 DIAGNOSIS — Z96651 Presence of right artificial knee joint: Secondary | ICD-10-CM | POA: Diagnosis not present

## 2021-05-08 DIAGNOSIS — M6281 Muscle weakness (generalized): Secondary | ICD-10-CM | POA: Diagnosis not present

## 2021-05-08 DIAGNOSIS — M25561 Pain in right knee: Secondary | ICD-10-CM | POA: Diagnosis not present

## 2021-05-08 DIAGNOSIS — M25661 Stiffness of right knee, not elsewhere classified: Secondary | ICD-10-CM | POA: Diagnosis not present

## 2021-05-13 ENCOUNTER — Other Ambulatory Visit: Payer: Self-pay | Admitting: Orthopaedic Surgery

## 2021-05-13 ENCOUNTER — Telehealth: Payer: Self-pay | Admitting: Orthopaedic Surgery

## 2021-05-13 MED ORDER — HYDROCODONE-ACETAMINOPHEN 5-325 MG PO TABS: 1.0000 | ORAL_TABLET | Freq: Four times a day (QID) | ORAL | 0 refills | Status: DC | PRN

## 2021-05-13 NOTE — Telephone Encounter (Signed)
Patient called. He would like a refill on pain medication.

## 2021-05-13 NOTE — Telephone Encounter (Signed)
Pt called and advised and stated understanding  

## 2021-05-14 ENCOUNTER — Encounter: Payer: Self-pay | Admitting: Physical Medicine & Rehabilitation

## 2021-05-14 ENCOUNTER — Other Ambulatory Visit: Payer: Self-pay

## 2021-05-14 ENCOUNTER — Encounter: Payer: Medicare HMO | Attending: Physical Medicine & Rehabilitation | Admitting: Physical Medicine & Rehabilitation

## 2021-05-14 VITALS — BP 157/80 | HR 84 | Temp 98.7°F | Ht 67.0 in | Wt 234.0 lb

## 2021-05-14 DIAGNOSIS — M48062 Spinal stenosis, lumbar region with neurogenic claudication: Secondary | ICD-10-CM | POA: Diagnosis not present

## 2021-05-14 NOTE — Progress Notes (Signed)
Subjective:    Patient ID: Samuel [name], male    DOB: [birth year], 65 y.o.   MRN: [id number] 65 year old male with a long history of low back pain.  He describes his pain as being sharp and stabbing affecting both sides of the low back upper buttock area going into his posterior thighs.  This pain can occur at any time although he does note that walking may exacerbated.  He can walk without an assistive device he climbs steps he drives.  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  He continues to help load and unload these appliances. The patient exercises 7 days a week by walking. He sleeps about 6 hours/day, sits around 4 hours a day stands about 2 hours a day and walks about 5 hours/day   He has seen primary care as well as sports medicine as well as musculoskeletal radiology for his back issues.  He has had 3 back injections, on 12/01/2019 underwent L3-L4 translaminar injection with good relief for 4 months.  On 04/09/2020 the patient had repeat L3-L4 translaminar injection without good relief.  On 12/26/2020 the patient had repeat L3-L4 translaminar injection with good relief The patient thinks his last injection was the best. Patient previously had tried physical therapy without much relief.  He has had intermittent narcotic analgesics last prescribed on 11/29/2020.  No longer taking HPI 65 yo male with Chronic lumbar pain of many year duration, gives hx of fall while in high school  RIght TKR Sept 2022- still in PT  Left TKR April 2022 Pain Inventory Average Pain 6 Pain Right Now 6 My pain is constant  In the last 24 hours, has pain interfered with the following? General activity 5 Relation with others 3 Enjoyment of life 3 What TIME of day is your pain at its worst? morning , daytime, evening, and night Sleep (in general) Fair  Pain is worse with: walking Pain improves with: medication Relief from Meds: 0  Family History  Problem  Relation Age of Onset   Dementia Mother        alcohol   Alcohol abuse Mother    Heart attack Father 28   Heart disease Father 9   COPD Father    Stroke Father    Healthy Daughter    Healthy Son    Healthy Son    CAD Brother    Heart attack Brother    Social History   Socioeconomic History   Marital status: Married    Spouse name: Not on file   Number of children: 3   Years of education: Not on file   Highest education level: Not on file  Occupational History   Occupation: disabled  Tobacco Use   Smoking status: Former    Packs/day: 2.00    Years: 30.00    Pack years: 60.00    Types: Cigarettes    Quit date: 06/16/1998    Years since quitting: 22.9   Smokeless tobacco: Never  Vaping Use   Vaping Use: Never used  Substance and Sexual Activity   Alcohol use: No    Comment: sober 08-28-2017   Drug use: Not Currently   Sexual activity: Yes    Partners: Female  Other Topics Concern   Not on file  Social History Narrative   Not on file   Social Determinants of Health   Financial Resource Strain: Not on file  Food Insecurity: Not on file  Transportation  Needs: Not on file  Physical Activity: Not on file  Stress: Not on file  Social Connections: Not on file   Past Surgical History:  Procedure Laterality Date   HERNIA REPAIR     KNEE ARTHROSCOPY Bilateral    LEFT HEART CATH AND CORONARY ANGIOGRAPHY N/A 06/15/2017   Procedure: LEFT HEART CATH AND CORONARY ANGIOGRAPHY;  Surgeon: Nelva Bush, MD;  Location: [place] [facility];  Service: Cardiovascular;  Laterality: N/A;   TONSILLECTOMY     TOTAL KNEE ARTHROPLASTY Left 10/09/2020   Procedure: LEFT TOTAL KNEE ARTHROPLASTY;  Surgeon: Mcarthur Rossetti, MD;  Location: [place];  Service: Orthopedics;  Laterality: Left;   TOTAL KNEE ARTHROPLASTY Right 03/05/2021   Procedure: RIGHT TOTAL KNEE ARTHROPLASTY;  Surgeon: Mcarthur Rossetti, MD;  Location: [place];  Service: Orthopedics;  Laterality: Right;    UMBILICAL HERNIA REPAIR N/A 11/03/2017   Procedure: UMBILICAL HERNIA REPAIR;  Surgeon: Clovis Riley, MD;  Location: [facility];  Service: General;  Laterality: N/A;   Past Surgical History:  Procedure Laterality Date   HERNIA REPAIR     KNEE ARTHROSCOPY Bilateral    LEFT HEART CATH AND CORONARY ANGIOGRAPHY N/A 06/15/2017   Procedure: LEFT HEART CATH AND CORONARY ANGIOGRAPHY;  Surgeon: Nelva Bush, MD;  Location: [place] [facility];  Service: Cardiovascular;  Laterality: N/A;   TONSILLECTOMY     TOTAL KNEE ARTHROPLASTY Left 10/09/2020   Procedure: LEFT TOTAL KNEE ARTHROPLASTY;  Surgeon: Mcarthur Rossetti, MD;  Location: [place];  Service: Orthopedics;  Laterality: Left;   TOTAL KNEE ARTHROPLASTY Right 03/05/2021   Procedure: RIGHT TOTAL KNEE ARTHROPLASTY;  Surgeon: Mcarthur Rossetti, MD;  Location: [place];  Service: Orthopedics;  Laterality: Right;   UMBILICAL HERNIA REPAIR N/A 11/03/2017   Procedure: UMBILICAL HERNIA REPAIR;  Surgeon: Clovis Riley, MD;  Location: [facility];  Service: General;  Laterality: N/A;   Past Medical History:  Diagnosis Date   Anxiety    Arthritis    Basal cell carcinoma 04/29/2018   upper mid back, right mid back    Basal cell carcinoma 04/29/2018   right mid back    Basal cell carcinoma 06/10/2018   left lover back    Bronchitis    Coronary artery disease    Mild-moderate non-obstructive CAD 06/15/17 (20% dLM, 50% mLAD, 30% OM3, 40% mRCA, 40% dRCA)   COVID-19 unknown   Depression    Former heavy cigarette smoker (20-39 per day) 03/05/2018   Quit 2000; 60 pak year history   History of hiatal hernia    HTN (hypertension)    Substance abuse ([place])    sober for 3 years   BP (!) 157/80   Pulse 84   Temp 98.7 F (37.1 C) (Oral)   Ht 5\' 7"  (1.702 m)   Wt 234 lb (106.1 kg)   SpO2 98%   BMI 36.65 kg/m   Opioid Risk Score:   Fall Risk Score:  `1  Depression screen PHQ 2/9  Depression screen [facility] 2/9 05/14/2021 02/01/2021 12/27/2019  10/26/2018 06/11/2017 05/27/2017  Decreased Interest 0 0 0 0 0 0  Down, Depressed, Hopeless 0 0 0 0 0 0  PHQ - 2 Score 0 0 0 0 0 0  Altered sleeping - 0 - 0 - -  Tired, decreased energy - 0 - 0 - -  Change in appetite - 0 - 0 - -  Feeling bad or failure about yourself  - 0 - 0 - -  Trouble concentrating -  0 - 0 - -  Moving slowly or fidgety/restless - 0 - 0 - -  Suicidal thoughts - 0 - 0 - -  PHQ-9 Score - 0 - 0 - -  Difficult doing work/chores - - - Not difficult at all - -      Review of Systems  Psychiatric/Behavioral:  The patient is nervous/anxious.   All other systems reviewed and are negative.     Objective:   Physical Exam Vitals and nursing note reviewed.  Constitutional:      Appearance: He is normal weight.  HENT:     Head: Normocephalic and atraumatic.  Eyes:     Extraocular Movements: Extraocular movements intact.     Conjunctiva/sclera: Conjunctivae normal.     Pupils: Pupils are equal, round, and reactive to light.  Musculoskeletal:     Right lower leg: No edema.     Left lower leg: No edema.  Skin:    General: Skin is warm and dry.  Neurological:     Mental Status: He is alert and oriented to person, place, and time.     Cranial Nerves: No dysarthria.     Sensory: Sensation is intact. No sensory deficit.     Motor: No weakness.     Coordination: Coordination normal.     Gait: Gait is intact.     Deep Tendon Reflexes:     Reflex Scores:      Patellar reflexes are 1+ on the right side and 1+ on the left side.      Achilles reflexes are 1+ on the right side and 1+ on the left side. Psychiatric:        Mood and Affect: Mood normal.        Behavior: Behavior normal.          Assessment & Plan:   Chronic low back pain with radiation into post thighs but not below knees, rec repeat L3-4 translaminar injectionwhich has helped in 2/3 prior occasions.  RTC in 6 wks if no sig relief would do B L3-4-5 MBB, to eval potential utility of RF

## 2021-05-14 NOTE — Patient Instructions (Signed)
Please call in ~2wks to let us know about results with ESI

## 2021-05-15 DIAGNOSIS — M6281 Muscle weakness (generalized): Secondary | ICD-10-CM | POA: Diagnosis not present

## 2021-05-15 DIAGNOSIS — M25561 Pain in right knee: Secondary | ICD-10-CM | POA: Diagnosis not present

## 2021-05-15 DIAGNOSIS — M25661 Stiffness of right knee, not elsewhere classified: Secondary | ICD-10-CM | POA: Diagnosis not present

## 2021-05-15 DIAGNOSIS — Z96651 Presence of right artificial knee joint: Secondary | ICD-10-CM | POA: Diagnosis not present

## 2021-05-16 ENCOUNTER — Ambulatory Visit
Admission: RE | Admit: 2021-05-16 | Discharge: 2021-05-16 | Disposition: A | Discharge status: Home / Self Care | Payer: Medicare HMO | Source: Ambulatory Visit | Attending: Family Medicine | Admitting: Family Medicine

## 2021-05-16 ENCOUNTER — Other Ambulatory Visit: Payer: Self-pay

## 2021-05-16 DIAGNOSIS — M48061 Spinal stenosis, lumbar region without neurogenic claudication: Secondary | ICD-10-CM

## 2021-05-16 DIAGNOSIS — G8929 Other chronic pain: Secondary | ICD-10-CM

## 2021-05-16 DIAGNOSIS — M545 Low back pain, unspecified: Secondary | ICD-10-CM

## 2021-05-16 DIAGNOSIS — M4727 Other spondylosis with radiculopathy, lumbosacral region: Secondary | ICD-10-CM | POA: Diagnosis not present

## 2021-05-16 MED ORDER — METHYLPREDNISOLONE ACETATE 40 MG/ML INJ SUSP (RADIOLOG
80.0000 mg | Freq: Once | INTRAMUSCULAR | Status: AC
  Administered 2021-05-16: 80 mg via EPIDURAL

## 2021-05-16 MED ORDER — IOPAMIDOL (ISOVUE-M 200) INJECTION 41%
1.0000 mL | Freq: Once | INTRAMUSCULAR | Status: AC
  Administered 2021-05-16: 1 mL via EPIDURAL

## 2021-05-16 NOTE — Discharge Instructions (Signed)

## 2021-05-22 ENCOUNTER — Telehealth: Payer: Self-pay

## 2021-05-22 NOTE — Telephone Encounter (Signed)
Refill request received for pt's fluoxetine. Per Dr. Ninfa Linden this is denied. I lvm to let pt know he will need to get this by his pcp.

## 2021-06-06 ENCOUNTER — Other Ambulatory Visit: Payer: Self-pay | Admitting: Physician Assistant

## 2021-06-06 ENCOUNTER — Telehealth: Payer: Self-pay | Admitting: Orthopaedic Surgery

## 2021-06-06 MED ORDER — HYDROCODONE-ACETAMINOPHEN 5-325 MG PO TABS: 1.0000 | ORAL_TABLET | Freq: Four times a day (QID) | ORAL | 0 refills | Status: DC | PRN

## 2021-06-06 NOTE — Telephone Encounter (Signed)
Patient requesting medication refill for hydrocodone. Please advise.

## 2021-06-12 ENCOUNTER — Encounter: Payer: Self-pay | Admitting: Orthopaedic Surgery

## 2021-06-12 ENCOUNTER — Ambulatory Visit (INDEPENDENT_AMBULATORY_CARE_PROVIDER_SITE_OTHER): Payer: Medicare HMO

## 2021-06-12 ENCOUNTER — Ambulatory Visit (INDEPENDENT_AMBULATORY_CARE_PROVIDER_SITE_OTHER): Payer: Medicare HMO | Admitting: Orthopaedic Surgery

## 2021-06-12 ENCOUNTER — Telehealth: Payer: Self-pay | Admitting: *Deleted

## 2021-06-12 DIAGNOSIS — Z96651 Presence of right artificial knee joint: Secondary | ICD-10-CM

## 2021-06-12 DIAGNOSIS — Z96652 Presence of left artificial knee joint: Secondary | ICD-10-CM

## 2021-06-12 NOTE — Telephone Encounter (Signed)
Ortho bundle 90 day call completed for patient's S/P right TKA.

## 2021-06-12 NOTE — Progress Notes (Signed)
The patient is now 3 months status post a right total knee arthroplasty.  We replaced his left knee in April of this year.  He said he is doing well overall.  He did fall off a ladder about a month ago and he feels like he has a lump on the right knee.  He is walking without assistive device and both knees move smoothly and fluidly.  There is only some mild swelling of the right knee.  Both knees feel ligamentously stable.  A standing AP and lateral of the right knee shows well-seated total knee arthroplasty with no complicating features.  There is no evidence of fracture or acute findings.  He will continue to increase his activities as comfort allows and work on Forensic scientist.  I do not need to see him back for 6 months.  At that visit we will have an AP and lateral both knees.  All questions and concerns were answered and

## 2021-06-18 ENCOUNTER — Ambulatory Visit: Payer: Medicare HMO | Admitting: Physical Medicine & Rehabilitation

## 2021-07-02 ENCOUNTER — Encounter: Payer: Self-pay | Admitting: Registered Nurse

## 2021-07-02 ENCOUNTER — Ambulatory Visit (INDEPENDENT_AMBULATORY_CARE_PROVIDER_SITE_OTHER): Payer: Medicare HMO | Admitting: Registered Nurse

## 2021-07-02 VITALS — BP 140/78 | HR 78 | Temp 98.2°F | Resp 16 | Ht 67.0 in | Wt 233.8 lb

## 2021-07-02 DIAGNOSIS — Z23 Encounter for immunization: Secondary | ICD-10-CM | POA: Diagnosis not present

## 2021-07-02 DIAGNOSIS — F411 Generalized anxiety disorder: Secondary | ICD-10-CM | POA: Diagnosis not present

## 2021-07-02 DIAGNOSIS — I10 Essential (primary) hypertension: Secondary | ICD-10-CM

## 2021-07-02 DIAGNOSIS — Z87891 Personal history of nicotine dependence: Secondary | ICD-10-CM | POA: Diagnosis not present

## 2021-07-02 MED ORDER — AMLODIPINE BESYLATE 5 MG PO TABS: 5.0000 mg | ORAL_TABLET | Freq: Every day | ORAL | 1 refills | Status: DC

## 2021-07-02 MED ORDER — FLUOXETINE HCL 20 MG PO CAPS: 20.0000 mg | ORAL_CAPSULE | Freq: Every day | ORAL | 3 refills | Status: DC
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
## 2021-07-02 NOTE — Patient Instructions (Signed)
Mr. Njoku -   Doristine Devoid to meet you  Refills given x 6 months  See you then for a med check and labs  Call if you need anything sooner,  Thank you,  Denice Paradise

## 2021-07-04 ENCOUNTER — Encounter: Payer: Self-pay | Admitting: Physical Medicine & Rehabilitation

## 2021-07-04 ENCOUNTER — Encounter: Payer: Medicare HMO | Attending: Physical Medicine & Rehabilitation | Admitting: Physical Medicine & Rehabilitation

## 2021-07-04 ENCOUNTER — Other Ambulatory Visit: Payer: Self-pay

## 2021-07-04 VITALS — BP 146/81 | HR 76 | Temp 98.2°F | Ht 67.0 in | Wt 231.0 lb

## 2021-07-04 DIAGNOSIS — M48062 Spinal stenosis, lumbar region with neurogenic claudication: Secondary | ICD-10-CM | POA: Insufficient documentation

## 2021-07-04 NOTE — Progress Notes (Deleted)
°  PROCEDURE RECORD [facility]   Name: Samuel [name] DOB:1955/07/10 MRN: [id number]  Date:07/04/2021  Physician: Alysia Penna, MD    Nurse/[name]: ***  Allergies: No Known Allergies  Consent Signed: {yes no:314532}  Is patient diabetic? {yes no:314532}  CBG today? ***  Pregnant: {yes no:314532} LMP: No LMP for male patient. (age 68-55)  Anticoagulants: {Yes/No:19989} Anti-inflammatory: {Yes/No:19989} Antibiotics: {Yes/No:19989}  Procedure: Bilater Medial Branch Block  Position: Prone Start Time:   End Time:   Fluoro Time:   RN/[name]      Time      BP      Pulse      Respirations      O2 Sat      S/S      Pain Level       D/C home with ***, patient A & O X 3, D/C instructions reviewed, and sits independently.

## 2021-07-04 NOTE — Progress Notes (Signed)
Subjective:    Patient ID: Samuel [name], male    DOB: 01-Dec-1955, 66 y.o.   MRN: [id number]  HPI Hx LBP , lumbar stenosis  S/p L3-4 Interlaminar ESI 05/16/2021 at [facility], patient doing very well.  He is able to do all his self-care and mobility.  He has even done some fairly heavy lifting without exacerbation of pain. Pain Inventory Average Pain 5 Pain Right Now 0 My pain is intermittent, constant, and sharp  In the last 24 hours, has pain interfered with the following? General activity 5 Relation with others 0 Enjoyment of life 0 What TIME of day is your pain at its worst? evening and night Sleep (in general) Good  Pain is worse with: walking, bending, sitting, inactivity, standing, and some activites Pain improves with: injections Relief from Meds:  no pain medicine taken      Family History  Problem Relation Age of Onset   Dementia Mother        alcohol   Alcohol abuse Mother    Heart attack Father 73   Heart disease Father 43   COPD Father    Stroke Father    Healthy Daughter    Healthy Son    Healthy Son    CAD Brother    Heart attack Brother    Social History   Socioeconomic History   Marital status: Married    Spouse name: Not on file   Number of children: 3   Years of education: Not on file   Highest education level: Not on file  Occupational History   Occupation: disabled  Tobacco Use   Smoking status: Former    Packs/day: 2.00    Years: 30.00    Pack years: 60.00    Types: Cigarettes    Quit date: 06/16/1998    Years since quitting: 23.0   Smokeless tobacco: Never  Vaping Use   Vaping Use: Never used  Substance and Sexual Activity   Alcohol use: No    Comment: sober 08-28-2017   Drug use: Not Currently   Sexual activity: Yes    Partners: Female  Other Topics Concern   Not on file  Social History Narrative   Not on file   Social Determinants of Health   Financial Resource Strain: Not on file  Food Insecurity: Not on  file  Transportation Needs: Not on file  Physical Activity: Not on file  Stress: Not on file  Social Connections: Not on file   Past Surgical History:  Procedure Laterality Date   HERNIA REPAIR     KNEE ARTHROSCOPY Bilateral    LEFT HEART CATH AND CORONARY ANGIOGRAPHY N/A 06/15/2017   Procedure: LEFT HEART CATH AND CORONARY ANGIOGRAPHY;  Surgeon: Nelva Bush, MD;  Location: [facility];  Service: Cardiovascular;  Laterality: N/A;   TONSILLECTOMY     TOTAL KNEE ARTHROPLASTY Left 10/09/2020   Procedure: LEFT TOTAL KNEE ARTHROPLASTY;  Surgeon: Mcarthur Rossetti, MD;  Location: [facility];  Service: Orthopedics;  Laterality: Left;   TOTAL KNEE ARTHROPLASTY Right 03/05/2021   Procedure: RIGHT TOTAL KNEE ARTHROPLASTY;  Surgeon: Mcarthur Rossetti, MD;  Location: [facility];  Service: Orthopedics;  Laterality: Right;   UMBILICAL HERNIA REPAIR N/A 11/03/2017   Procedure: UMBILICAL HERNIA REPAIR;  Surgeon: Clovis Riley, MD;  [facility];  Service: General;  Laterality: N/A;   Past Medical History:  Diagnosis Date   Anxiety    Arthritis    Basal cell carcinoma 04/29/2018  upper mid back, right mid back    Basal cell carcinoma 04/29/2018   right mid back    Basal cell carcinoma 06/10/2018   left lover back    Bronchitis    Coronary artery disease    Mild-moderate non-obstructive CAD 06/15/17 (20% dLM, 50% mLAD, 30% OM3, 40% mRCA, 40% dRCA)   COVID-19 unknown   Depression    Former heavy cigarette smoker (20-39 per day) 03/05/2018   Quit 2000; 60 pak year history   History of hiatal hernia    HTN (hypertension)    Substance abuse ([place])    4 years as of march 2023   BP (!) 146/81 Comment: started b/p meds on yesterday   Pulse 76    Temp 98.2 F (36.8 C)    Ht 5\' 7"  (1.702 m)    Wt 231 lb (104.8 kg)    SpO2 98%    BMI 36.18 kg/m   Opioid Risk Score:   Fall Risk Score:  `1  Depression screen PHQ 2/9  Depression screen [facility] 2/9 07/02/2021 05/14/2021 02/01/2021  12/27/2019 10/26/2018 06/11/2017 05/27/2017  Decreased Interest 0 0 0 0 0 0 0  Down, Depressed, Hopeless 0 0 0 0 0 0 0  PHQ - 2 Score 0 0 0 0 0 0 0  Altered sleeping 0 - 0 - 0 - -  Tired, decreased energy 0 - 0 - 0 - -  Change in appetite 0 - 0 - 0 - -  Feeling bad or failure about yourself  0 - 0 - 0 - -  Trouble concentrating 0 - 0 - 0 - -  Moving slowly or fidgety/restless 0 - 0 - 0 - -  Suicidal thoughts 0 - 0 - 0 - -  PHQ-9 Score 0 - 0 - 0 - -  Difficult doing work/chores - - - - Not difficult at all - -    Review of Systems  All other systems reviewed and are negative.     Objective:   Physical Exam Constitutional:      Appearance: He is obese.  HENT:     Head: Normocephalic and atraumatic.  Eyes:     Extraocular Movements: Extraocular movements intact.     Conjunctiva/sclera: Conjunctivae normal.     Pupils: Pupils are equal, round, and reactive to light.  Musculoskeletal:     Right lower leg: No edema.     Left lower leg: No edema.     Comments: Normal range of motion bilateral upper and lower limbs  Skin:    General: Skin is warm and dry.  Neurological:     Mental Status: He is alert and oriented to person, place, and time.     Comments: Motor strength 5/5 bilateral deltoid bicep tricep grip hip flexor knee extensor ankle dorsiflexor and plantar flexor Negative straight leg raising bilaterally Sensation normal bilateral lower limbs  Psychiatric:        Mood and Affect: Mood normal.        Behavior: Behavior normal.          Assessment & Plan:  1.  Spinal stenosis lumbar, excellent response to L3-L4 translaminar epidural steroid injections under fluoroscopic guidance.  At this point he does not need to be scheduled for another procedure.  He will call once his pain starts recurring.

## 2021-07-09 DIAGNOSIS — H524 Presbyopia: Secondary | ICD-10-CM | POA: Diagnosis not present

## 2021-07-09 DIAGNOSIS — H2513 Age-related nuclear cataract, bilateral: Secondary | ICD-10-CM | POA: Diagnosis not present

## 2021-07-16 ENCOUNTER — Telehealth: Payer: Self-pay

## 2021-07-16 NOTE — Telephone Encounter (Signed)
Caller name:Boniface Stephanie Coup   On DPR? :Yes  Call back number:[phone]  Provider they see: Maximiano Coss   Reason for call:Pt is calling wanting to see if he can get a test order about having low testosterone.  Pt has wants to to see about having cologuard test as well he got a text that this is due as well.

## 2021-07-17 ENCOUNTER — Ambulatory Visit (INDEPENDENT_AMBULATORY_CARE_PROVIDER_SITE_OTHER): Payer: Medicare HMO | Admitting: Registered Nurse

## 2021-07-17 ENCOUNTER — Encounter: Payer: Self-pay | Admitting: Registered Nurse

## 2021-07-17 VITALS — BP 124/60 | HR 72 | Temp 98.3°F | Resp 16 | Wt 234.6 lb

## 2021-07-17 DIAGNOSIS — Z13220 Encounter for screening for lipoid disorders: Secondary | ICD-10-CM

## 2021-07-17 DIAGNOSIS — Z1329 Encounter for screening for other suspected endocrine disorder: Secondary | ICD-10-CM | POA: Diagnosis not present

## 2021-07-17 DIAGNOSIS — Z13228 Encounter for screening for other metabolic disorders: Secondary | ICD-10-CM

## 2021-07-17 DIAGNOSIS — Z1211 Encounter for screening for malignant neoplasm of colon: Secondary | ICD-10-CM

## 2021-07-17 DIAGNOSIS — Z125 Encounter for screening for malignant neoplasm of prostate: Secondary | ICD-10-CM

## 2021-07-17 DIAGNOSIS — Z130 Encounter for screening for diseases of the blood and blood-forming organs and certain disorders involving the immune mechanism: Secondary | ICD-10-CM | POA: Diagnosis not present

## 2021-07-17 DIAGNOSIS — R5383 Other fatigue: Secondary | ICD-10-CM | POA: Diagnosis not present

## 2021-07-17 LAB — LIPID PANEL
Cholesterol: 168 mg/dL (ref 0–200)
HDL: 39.5 mg/dL (ref >39.00)
LDL Cholesterol: 116 mg/dL — ABNORMAL HIGH (ref 0–99)
NonHDL: 128.03
Total CHOL/HDL Ratio: 4
Triglycerides: 62 mg/dL (ref 0.0–149.0)
VLDL: 12.4 mg/dL (ref 0.0–40.0)

## 2021-07-17 LAB — CBC WITH DIFFERENTIAL/PLATELET
Basophils Absolute: 0.1 10*3/uL (ref 0.0–0.1)
Basophils Relative: 0.9 % (ref 0.0–3.0)
Eosinophils Absolute: 0.4 10*3/uL (ref 0.0–0.7)
Eosinophils Relative: 4.8 % (ref 0.0–5.0)
HCT: 43.3 % (ref 39.0–52.0)
Hemoglobin: 14.2 g/dL (ref 13.0–17.0)
Lymphocytes Relative: 27.4 % (ref 12.0–46.0)
Lymphs Abs: 2.2 10*3/uL (ref 0.7–4.0)
MCHC: 32.7 g/dL (ref 30.0–36.0)
MCV: 85 fl (ref 78.0–100.0)
Monocytes Absolute: 0.8 10*3/uL (ref 0.1–1.0)
Monocytes Relative: 9.7 % (ref 3.0–12.0)
Neutro Abs: 4.6 10*3/uL (ref 1.4–7.7)
Neutrophils Relative %: 57.2 % (ref 43.0–77.0)
Platelets: 225 10*3/uL (ref 150.0–400.0)
RBC: 5.09 Mil/uL (ref 4.22–5.81)
RDW: 15.8 % — ABNORMAL HIGH (ref 11.5–15.5)
WBC: 8.1 10*3/uL (ref 4.0–10.5)

## 2021-07-17 LAB — PSA, MEDICARE: PSA: 2.56 ng/ml (ref 0.10–4.00)

## 2021-07-17 LAB — COMPREHENSIVE METABOLIC PANEL
ALT: 16 U/L (ref 0–53)
AST: 14 U/L (ref 0–37)
Albumin: 4.2 g/dL (ref 3.5–5.2)
Alkaline Phosphatase: 64 U/L (ref 39–117)
BUN: 16 mg/dL (ref 6–23)
CO2: 30 mEq/L (ref 19–32)
Calcium: 9.4 mg/dL (ref 8.4–10.5)
Chloride: 103 mEq/L (ref 96–112)
Creatinine, Ser: 0.88 mg/dL (ref 0.40–1.50)
GFR: 89.07 mL/min (ref >60.00)
Glucose, Bld: 81 mg/dL (ref 70–99)
Potassium: 4.4 mEq/L (ref 3.5–5.1)
Sodium: 139 mEq/L (ref 135–145)
Total Bilirubin: 0.4 mg/dL (ref 0.2–1.2)
Total Protein: 6.8 g/dL (ref 6.0–8.3)

## 2021-07-17 LAB — HEMOGLOBIN A1C: Hgb A1c MFr Bld: 5.9 % (ref 4.6–6.5)

## 2021-07-17 LAB — TSH: TSH: 2.39 u[IU]/mL (ref 0.35–5.50)

## 2021-07-17 LAB — TESTOSTERONE: Testosterone: 444.15 ng/dL (ref 300.00–890.00)

## 2021-07-17 NOTE — Telephone Encounter (Signed)
Patient was seen in the office today this test was done and cologuard was placed.

## 2021-07-17 NOTE — Patient Instructions (Signed)
Mr. Sciulli -   Doristine Devoid to see you  I'll let you know how labs look  Call if you need anything  Thanks,  Denice Paradise

## 2021-07-17 NOTE — Progress Notes (Signed)
Established Patient Office Visit  Subjective:  Patient ID: Samuel [name], male    DOB: 1955/10/09  Age: 66 y.o. MRN: [id number]  CC:  Chief Complaint  Patient presents with   testosterone    Patient wants testosterone levels checked    HPI Samuel [name] presents for check testosterone and cologuard.  Colon ca screen: Due for cologuard Has done in the past without concern No questions at this time.  Testosterone: Low energy. Starting to notice some ED Does not want medication at this time  Labs: Has been told he was prediabetic in the past Hx of alcohol abuse, sober for some time now. Would like routine labs checked.   Past Medical History:  Diagnosis Date   Anxiety    Arthritis    Basal cell carcinoma 04/29/2018   upper mid back, right mid back    Basal cell carcinoma 04/29/2018   right mid back    Basal cell carcinoma 06/10/2018   left lover back    Bronchitis    Coronary artery disease    Mild-moderate non-obstructive CAD 06/15/17 (20% dLM, 50% mLAD, 30% OM3, 40% mRCA, 40% dRCA)   COVID-19 unknown   Depression    Former heavy cigarette smoker (20-39 per day) 03/05/2018   Quit 2000; 60 pak year history   History of hiatal hernia    HTN (hypertension)    Substance abuse ([place])    4 years as of march 2023    Past Surgical History:  Procedure Laterality Date   HERNIA REPAIR     KNEE ARTHROSCOPY Bilateral    LEFT HEART CATH AND CORONARY ANGIOGRAPHY N/A 06/15/2017   Procedure: LEFT HEART CATH AND CORONARY ANGIOGRAPHY;  Surgeon: Nelva Bush, MD;  Location: [facility];  Service: Cardiovascular;  Laterality: N/A;   TONSILLECTOMY     TOTAL KNEE ARTHROPLASTY Left 10/09/2020   Procedure: LEFT TOTAL KNEE ARTHROPLASTY;  Surgeon: Mcarthur Rossetti, MD;  Location: [facility];  Service: Orthopedics;  Laterality: Left;   TOTAL KNEE ARTHROPLASTY Right 03/05/2021   Procedure: RIGHT TOTAL KNEE ARTHROPLASTY;  Surgeon: Mcarthur Rossetti, MD;  Location: [facility];  Service: Orthopedics;  Laterality: Right;   UMBILICAL HERNIA REPAIR N/A 11/03/2017   Procedure: UMBILICAL HERNIA REPAIR;  Surgeon: Clovis Riley, MD;  [facility];  Service: General;  Laterality: N/A;    Family History  Problem Relation Age of Onset   Dementia Mother        alcohol   Alcohol abuse Mother    Heart attack Father 22   Heart disease Father 23   COPD Father    Stroke Father    Healthy Daughter    Healthy Son    Healthy Son    CAD Brother    Heart attack Brother     Social History   Socioeconomic History   Marital status: Married    Spouse name: Not on file   Number of children: 3   Years of education: Not on file   Highest education level: Not on file  Occupational History   Occupation: disabled  Tobacco Use   Smoking status: Former    Packs/day: 2.00    Years: 30.00    Pack years: 60.00    Types: Cigarettes    Quit date: 06/16/1998    Years since quitting: 23.1   Smokeless tobacco: Never  Vaping Use   Vaping Use: Never used  Substance and Sexual Activity   Alcohol use: No  Comment: sober 08-28-2017   Drug use: Not Currently   Sexual activity: Yes    Partners: Female  Other Topics Concern   Not on file  Social History Narrative   Not on file   Social Determinants of Health   Financial Resource Strain: Not on file  Food Insecurity: Not on file  Transportation Needs: Not on file  Physical Activity: Not on file  Stress: Not on file  Social Connections: Not on file  Intimate Partner Violence: Not on file    Outpatient Medications Prior to Visit  Medication Sig Dispense Refill   acetaminophen (TYLENOL) 500 MG tablet Take 1,000 mg by mouth every 6 (six) hours as needed for moderate pain.     amLODipine (NORVASC) 5 MG tablet Take 1 tablet (5 mg total) by mouth daily. 90 tablet 1   aspirin 81 MG chewable tablet Chew 1 tablet (81 mg total) by mouth 2 (two) times daily. 60 tablet 0   FLUoxetine (PROZAC) 20 MG capsule Take 1-2  capsules (20-40 mg total) by mouth daily. 180 capsule 3   methocarbamol (ROBAXIN) 500 MG tablet Take 1 tablet (500 mg total) by mouth every 6 (six) hours as needed for muscle spasms. (Patient not taking: Reported on 07/17/2021) 40 tablet 1   VITAMIN D PO Take 3 tablets by mouth daily. (Patient not taking: Reported on 07/17/2021)     No facility-administered medications prior to visit.    No Known Allergies  ROS Review of Systems  Constitutional:  Positive for fatigue. Negative for activity change, appetite change, chills, diaphoresis, fever and unexpected weight change.  HENT: Negative.    Eyes: Negative.   Respiratory: Negative.    Cardiovascular: Negative.   Gastrointestinal: Negative.   Genitourinary: Negative.   Musculoskeletal: Negative.   Skin: Negative.   Neurological: Negative.   Psychiatric/Behavioral: Negative.    All other systems reviewed and are negative.    Objective:    Physical Exam Constitutional:      General: He is not in acute distress.    Appearance: Normal appearance. He is normal weight. He is not ill-appearing, toxic-appearing or diaphoretic.  Cardiovascular:     Rate and Rhythm: Normal rate and regular rhythm.     Heart sounds: Normal heart sounds. No murmur heard.   No friction rub. No gallop.  Pulmonary:     Effort: Pulmonary effort is normal. No respiratory distress.     Breath sounds: Normal breath sounds. No stridor. No wheezing, rhonchi or rales.  Chest:     Chest wall: No tenderness.  Neurological:     General: No focal deficit present.     Mental Status: He is alert and oriented to person, place, and time. Mental status is at baseline.  Psychiatric:        Mood and Affect: Mood normal.        Behavior: Behavior normal.        Thought Content: Thought content normal.        Judgment: Judgment normal.    BP 124/60    Pulse 72    Temp 98.3 F (36.8 C)    Resp 16    Wt 234 lb 9.6 oz (106.4 kg)    SpO2 99%    BMI 36.74 kg/m  Wt Readings  from Last 3 Encounters:  07/17/21 234 lb 9.6 oz (106.4 kg)  07/04/21 231 lb (104.8 kg)  07/02/21 233 lb 12.8 oz (106.1 kg)     Health Maintenance Due  Topic Date Due  Fecal DNA (Cologuard)  05/06/2021    There are no preventive care reminders to display for this patient.  Lab Results  Component Value Date   TSH 3.22 09/24/2020   Lab Results  Component Value Date   WBC 14.4 (H) 03/06/2021   HGB 13.4 03/06/2021   HCT 39.9 03/06/2021   MCV 87.7 03/06/2021   PLT 199 03/06/2021   Lab Results  Component Value Date   NA 136 03/06/2021   K 4.4 03/06/2021   CO2 25 03/06/2021   GLUCOSE 131 (H) 03/06/2021   BUN 12 03/06/2021   CREATININE 1.10 03/06/2021   BILITOT 0.4 09/24/2020   ALKPHOS 62 12/29/2019   AST 19 09/24/2020   ALT 18 09/24/2020   PROT 6.4 09/24/2020   ALBUMIN 4.5 12/29/2019   CALCIUM 8.7 (L) 03/06/2021   ANIONGAP 7 03/06/2021   GFR 79.58 04/30/2020   Lab Results  Component Value Date   CHOL 172 12/06/2018   Lab Results  Component Value Date   HDL 45.20 12/06/2018   Lab Results  Component Value Date   LDLCALC 111 (H) 12/06/2018   Lab Results  Component Value Date   TRIG 83.0 12/06/2018   Lab Results  Component Value Date   CHOLHDL 4 12/06/2018   Lab Results  Component Value Date   HGBA1C 5.8 12/06/2018      Assessment & Plan:   Problem List Items Addressed This Visit   None Visit Diagnoses     Low energy    -  Primary   Relevant Orders   Testosterone   Hemoglobin A1c   TSH   Colon cancer screening       Relevant Orders   Cologuard   Screening for endocrine, metabolic and immunity disorder       Relevant Orders   CBC with Differential/Platelet   Comprehensive metabolic panel   Hemoglobin A1c   TSH   Lipid screening       Relevant Orders   Lipid panel   Screening PSA (prostate specific antigen)       Relevant Orders   PSA, Medicare ( [place] only)       No orders of the defined types were placed in this  encounter.   Follow-up: No follow-ups on file.   PLAN Labs collected. Will follow up with the patient as warranted. Cologuard sent Patient encouraged to call clinic with any questions, comments, or concerns.   Maximiano Coss, NP

## 2021-07-22 DIAGNOSIS — Z1211 Encounter for screening for malignant neoplasm of colon: Secondary | ICD-10-CM | POA: Diagnosis not present

## 2021-07-31 LAB — COLOGUARD: COLOGUARD: NEGATIVE

## 2021-08-13 ENCOUNTER — Telehealth: Payer: Self-pay | Admitting: Registered Nurse

## 2021-08-13 NOTE — Telephone Encounter (Signed)
Chief Complaint Prescription Refill or Medication Request (non symptomatic) Reason for Call Medication Question / Request Initial Comment Caller states he needs Dr. to send in a RX for Prozac. Translation No Nurse Assessment Nurse: Velta Addison, RN, [place] Date/Time (Eastern Time): 08/12/2021 5:47:36 PM Confirm and document reason for call. If symptomatic, describe symptoms. ---Caller states he needs Dr. to send in a RX for Prozac. Caller states he has 7 pills left, just getting low. Caller denies any new or worsening symptoms. States he needs it sent to the pharmacy that is on file. States he is still responding well to the medication and is not having any problems or side effects. Does the patient have any new or worsening symptoms? ---No Disp. Time Eilene Ghazi Time) Disposition Final User 08/12/2021 5:17:29 PM Attempt made - message left Velta Addison, RN, [place] 08/12/2021 5:26:58 PM Attempt made - message left Velta Addison, RN, [place] 08/12/2021 5:49:33 PM Clinical Call Yes Parrott, RN, [employer]

## 2021-08-13 NOTE — Telephone Encounter (Signed)
Called and spoke with the patient to let him know he should have medication refills at the pharmacy 180 capsules were dispensed on 07/02/2021 with 3 refills. Patient is calling the pharmacy.

## 2021-09-11 ENCOUNTER — Ambulatory Visit (INDEPENDENT_AMBULATORY_CARE_PROVIDER_SITE_OTHER): Payer: Medicare HMO | Admitting: Orthopaedic Surgery

## 2021-09-11 ENCOUNTER — Other Ambulatory Visit: Payer: Self-pay

## 2021-09-11 ENCOUNTER — Ambulatory Visit (INDEPENDENT_AMBULATORY_CARE_PROVIDER_SITE_OTHER): Payer: Medicare HMO

## 2021-09-11 DIAGNOSIS — G8929 Other chronic pain: Secondary | ICD-10-CM | POA: Diagnosis not present

## 2021-09-11 DIAGNOSIS — M25512 Pain in left shoulder: Secondary | ICD-10-CM | POA: Diagnosis not present

## 2021-09-11 MED ORDER — METHYLPREDNISOLONE ACETATE 40 MG/ML IJ SUSP
40.0000 mg | INTRAMUSCULAR | Status: AC | PRN
  Administered 2021-09-11: 40 mg via INTRA_ARTICULAR

## 2021-09-11 MED ORDER — LIDOCAINE HCL 1 % IJ SOLN
3.0000 mL | INTRAMUSCULAR | Status: AC | PRN
  Administered 2021-09-11: 3 mL

## 2021-09-11 NOTE — Progress Notes (Signed)
? ?Office Visit Note ?  ?Patient: Samuel [name]           ?Date of Birth: 06/06/1956           ?MRN: [id number] ?Visit Date: 09/11/2021 ?             ?Requested by: Maximiano Coss, NP ?[address] ?[place],  [place] [address] ?PCP: Maximiano Coss, NP ? ? ?Assessment & Plan: ?Visit Diagnoses:  ?1. Chronic left shoulder pain   ? ? ?Plan: I went over the x-rays with him of his left shoulder.  I recommended a steroid injection today in the left shoulder subacromial outlet and then setting him up for an intra-articular steroid injection by Dr. Ernestina Patches under fluoroscopy into the left shoulder joint.  He agrees with this treatment plan.  The next time I need to see him back is 3 months from now.  At that visit we will have a standing AP and lateral of both operative knees. ? ?Follow-Up Instructions: Return in about 3 months (around 12/12/2021).  ? ?Orders:  ?Orders Placed This Encounter  ?Procedures  ? Large Joint Inj  ? XR Shoulder Left  ? ?No orders of the defined types were placed in this encounter. ? ? ? ? Procedures: ?Large Joint Inj: L subacromial bursa on 09/11/2021 1:59 PM ?Indications: pain and diagnostic evaluation ?Details: 22 G 1.5 in needle ? ?Arthrogram: No ? ?Medications: 3 mL lidocaine 1 %; 40 mg methylPREDNISolone acetate 40 MG/ML ?Outcome: tolerated well, no immediate complications ?Procedure, treatment alternatives, risks and benefits explained, specific risks discussed. Consent was given by the patient. Immediately prior to procedure a time out was called to verify the correct patient, procedure, equipment, support staff and site/side marked as required. Patient was prepped and draped in the usual sterile fashion.  ? ? ? ? ?Clinical Data: ?No additional findings. ? ? ?Subjective: ?Chief Complaint  ?Patient presents with  ? Left Shoulder - Pain  ?The patient comes in today for evaluation treatment of left chronic shoulder pain.  I actually have replaced both of his knees last year.  He reports a remote  steroid injection in his left shoulder years ago that did help.  He denies any specific injuries but his shoulder does hurt at times with different activities and sometimes chronically aches as well.  He denies any numbness and tingling or neck pain. ? ?HPI ? ?Review of Systems ?There is currently listed no fever, chills, nausea, vomiting ? ?Objective: ?Vital Signs: There were no vitals taken for this visit. ? ?Physical Exam ?He is alert and oriented x3 and in no acute distress ?Ortho Exam ?Examination of his left shoulder shows some slight grinding at the glenohumeral joint with good range of motion overall.  There is just slight weakness in the rotator cuff but pain throughout the arc of motion.  There is positive Neer and Hawkins signs. ?Specialty Comments:  ?No specialty comments available. ? ?Imaging: ?XR Shoulder Left ? ?Result Date: 09/11/2021 ?3 views of the left shoulder show no acute findings.  The shoulder is well located.  There is moderate glenohumeral arthritic changes.  ? ? ?PMFS History: ?Patient Active Problem List  ? Diagnosis Date Noted  ? Anxiety state 07/02/2021  ? Status post total right knee replacement 03/05/2021  ? Status post left knee replacement 10/09/2020  ? Unilateral primary osteoarthritis, left knee 06/22/2018  ? Unilateral primary osteoarthritis, right knee 06/22/2018  ? Screening for colorectal cancer 05/17/2018  ? Nonobstructive atherosclerosis of  coronary artery 03/05/2018  ? Obesity (BMI 30-39.9) 03/05/2018  ? Former heavy cigarette smoker (20-39 per day) 03/05/2018  ? Varicose veins of bilateral lower extremities with pain 12/28/2017  ? Umbilical hernia 77/82/4235  ? Alcohol abuse, in remission 09/17/2017  ? Osteoarthritis of knee 08/10/2017  ? Rosacea 06/11/2017  ? Subclinical hypothyroidism 06/05/2017  ? Essential hypertension 05/27/2017  ? Bilateral hearing loss 05/04/2014  ? ?Past Medical History:  ?Diagnosis Date  ? Anxiety   ? Arthritis   ? Basal cell carcinoma 04/29/2018   ? upper mid back, right mid back   ? Basal cell carcinoma 04/29/2018  ? right mid back   ? Basal cell carcinoma 06/10/2018  ? left lover back   ? Bronchitis   ? Coronary artery disease   ? Mild-moderate non-obstructive CAD 06/15/17 (20% dLM, 50% mLAD, 30% OM3, 40% mRCA, 40% dRCA)  ? COVID-19 unknown  ? Depression   ? Former heavy cigarette smoker (20-39 per day) 03/05/2018  ? Quit 2000; 60 pak year history  ? History of hiatal hernia   ? HTN (hypertension)   ? Substance abuse ([place])   ? 4 years as of march 2023  ?  ?Family History  ?Problem Relation Age of Onset  ? Dementia Mother   ?     alcohol  ? Alcohol abuse Mother   ? Heart attack Father 76  ? Heart disease Father 43  ? COPD Father   ? Stroke Father   ? Healthy Daughter   ? Healthy Son   ? Healthy Son   ? CAD Brother   ? Heart attack Brother   ?  ?Past Surgical History:  ?Procedure Laterality Date  ? HERNIA REPAIR    ? KNEE ARTHROSCOPY Bilateral   ? LEFT HEART CATH AND CORONARY ANGIOGRAPHY N/A 06/15/2017  ? Procedure: LEFT HEART CATH AND CORONARY ANGIOGRAPHY;  Surgeon: Nelva Bush, MD;  Location: [facility];  Service: Cardiovascular;  Laterality: N/A;  ? TONSILLECTOMY    ? TOTAL KNEE ARTHROPLASTY Left 10/09/2020  ? Procedure: LEFT TOTAL KNEE ARTHROPLASTY;  Surgeon: Mcarthur Rossetti, MD;  Location: [facility];  Service: Orthopedics;  Laterality: Left;  ? TOTAL KNEE ARTHROPLASTY Right 03/05/2021  ? Procedure: RIGHT TOTAL KNEE ARTHROPLASTY;  Surgeon: Mcarthur Rossetti, MD;  Location: [facility];  Service: Orthopedics;  Laterality: Right;  ? UMBILICAL HERNIA REPAIR N/A 11/03/2017  ? Procedure: UMBILICAL HERNIA REPAIR;  Surgeon: Clovis Riley, MD;  Location: [facility];  Service: General;  Laterality: N/A;  ? ?Social History  ? ?Occupational History  ? Occupation: disabled  ?Tobacco Use  ? Smoking status: Former  ?  Packs/day: 2.00  ?  Years: 30.00  ?  Pack years: 60.00  ?  Types: Cigarettes  ?  Quit date: 06/16/1998  ?  Years since quitting: 23.2  ?  Smokeless tobacco: Never  ?Vaping Use  ? Vaping Use: Never used  ?Substance and Sexual Activity  ? Alcohol use: No  ?  Comment: sober 08-28-2017  ? Drug use: Not Currently  ? Sexual activity: Yes  ?  Partners: Female  ? ? ? ? ? ? ?

## 2021-09-19 ENCOUNTER — Encounter: Payer: Self-pay | Admitting: Physical Medicine & Rehabilitation

## 2021-09-19 ENCOUNTER — Encounter: Payer: Medicare HMO | Attending: Physical Medicine & Rehabilitation | Admitting: Physical Medicine & Rehabilitation

## 2021-09-19 ENCOUNTER — Encounter: Payer: Medicare HMO | Admitting: Physical Medicine & Rehabilitation

## 2021-09-19 VITALS — BP 148/74 | HR 61 | Ht 67.0 in | Wt 238.0 lb

## 2021-09-19 DIAGNOSIS — M48062 Spinal stenosis, lumbar region with neurogenic claudication: Secondary | ICD-10-CM | POA: Diagnosis not present

## 2021-09-19 NOTE — Progress Notes (Signed)
Left  paramedian L3-4 Lumbar interlaminar epidural steroid injection under fluoroscopic guidance  Indication: Lumbosacral radiculitis is not relieved by medication management or other conservative care and interfering with self-care and mobility.  No  anticoagulant use.  Informed consent was obtained after describing risk and benefits of the procedure with the patient, this includes bleeding, bruising, infection, paralysis and medication side effects.  The patient wishes to proceed and has given written consent.  Patient was placed in a prone position.  The lumbar area was marked and prepped with Betadine.  It was entered with a 25-gauge 1-1/2 inch needle and one mL of 1% lidocaine was injected into the skin and subcutaneous tissue.  Then a 17-gauge spinal needle was inserted under fluoroscopic guidance into the L3-4 interlaminar space under AP and Lateral imaging.  Once needle tip of approximated the posterior elements, a loss of resistance technique was utilized with lateral imaging.  A positive loss of resistance was obtained and then confirmed by injecting 2 mL's of Omnipaque 180.  Then a solution containing 1.5 mL's of 6mg/ml Celestone and 1.5 mL's of 1% lidocaine was injected.  The patient tolerated procedure well.  Post procedure instructions were given.  Please see post procedure form.  

## 2021-09-19 NOTE — Patient Instructions (Signed)

## 2021-09-19 NOTE — Progress Notes (Signed)
?  PROCEDURE RECORD ?[facility] ? ? ?Name: Samuel [name] ?DOB:09-Feb-1956 ?MRN: [id number] ? ?Date:09/19/2021  Physician: Alysia Penna, MD   ? ?Nurse/[name]: Ronnald Ramp, RMA ? ?Allergies: No Known Allergies ? ?Consent Signed: Yes.    Is patient diabetic? No.  CBG today? . ? ?Pregnant: No. LMP: No LMP for male patient. (age 68-55) ? ?Anticoagulants: no ?Anti-inflammatory: no ?Antibiotics: no ? ?Procedure: L3-4 Interlaminar Epidural Steroid Injection  Position: Prone ?Start Time: 10:11 am  End Time: 10:15  Fluoro Time: 19 ? ?RN/[name] Ronnald Ramp, RMA Ronnald Ramp, RMA    ?Time 9:33 am 10:20 am    ?BP 148/85 148/74    ?Pulse 68 61    ?Respirations 16 18    ?O2 Sat 99 99    ?S/S 6 6    ?Pain Level 0/10 0/10    ? ?D/C home with wife, patient A & O X 3, D/C instructions reviewed, and sits independently. ? ? ? ? ? ? ? ?

## 2021-10-03 ENCOUNTER — Encounter: Payer: Medicare HMO | Admitting: Physical Medicine and Rehabilitation

## 2021-10-24 ENCOUNTER — Ambulatory Visit: Payer: Self-pay

## 2021-10-24 ENCOUNTER — Ambulatory Visit (INDEPENDENT_AMBULATORY_CARE_PROVIDER_SITE_OTHER): Payer: Medicare HMO | Admitting: Physical Medicine and Rehabilitation

## 2021-10-24 DIAGNOSIS — G8929 Other chronic pain: Secondary | ICD-10-CM

## 2021-10-25 DIAGNOSIS — M25512 Pain in left shoulder: Secondary | ICD-10-CM | POA: Diagnosis not present

## 2021-10-25 DIAGNOSIS — G8929 Other chronic pain: Secondary | ICD-10-CM

## 2021-10-28 MED ORDER — BUPIVACAINE HCL 0.25 % IJ SOLN
5.0000 mL | INTRAMUSCULAR | Status: AC | PRN
  Administered 2021-10-25: 5 mL via INTRA_ARTICULAR

## 2021-10-28 MED ORDER — TRIAMCINOLONE ACETONIDE 40 MG/ML IJ SUSP
40.0000 mg | INTRAMUSCULAR | Status: AC | PRN
  Administered 2021-10-25: 40 mg via INTRA_ARTICULAR

## 2021-10-28 NOTE — Progress Notes (Signed)
? ?  Samuel [name] - 66 y.o. male MRN [id number]  Date of birth: [birth year] ? ?Office Visit Note: ?Visit Date: 10/24/2021 ?PCP: Maximiano Coss, NP ?Referred by: Mcarthur Rossetti* ? ?Subjective: ?Chief Complaint  ?Patient presents with  ? Left Shoulder - Pain  ? ?HPI:  Samuel [name] is a 66 y.o. male who comes in today at the request of Dr. Jean Rosenthal for planned Left anesthetic glenohumeral arthrogram with fluoroscopic guidance.  The patient has failed conservative care including home exercise, medications, time and activity modification.  This injection will be diagnostic and hopefully therapeutic.  Please see requesting physician notes for further details and justification.  ? ?ROS Otherwise per HPI. ? ?Assessment & Plan: ?Visit Diagnoses:  ?  ICD-10-CM   ?1. Chronic left shoulder pain  M25.512 XR C-ARM NO REPORT  ? G89.29   ?  ?  ?Plan: No additional findings.  ? ?Meds & Orders: No orders of the defined types were placed in this encounter. ?  ?Orders Placed This Encounter  ?Procedures  ? Large Joint Inj  ? XR C-ARM NO REPORT  ?  ?Follow-up: Return if symptoms worsen or fail to improve.  ? ?Procedures: ?Large Joint Inj: L glenohumeral on 10/25/2021 8:20 AM ?Indications: pain and diagnostic evaluation ?Details: 22 G 3.5 in needle, fluoroscopy-guided anteromedial approach ? ?Arthrogram: No ? ?Medications: 40 mg triamcinolone acetonide 40 MG/ML; 5 mL bupivacaine 0.25 % ?Outcome: tolerated well, no immediate complications ? ?There was excellent flow of contrast producing a partial arthrogram of the glenohumeral joint. The patient did have relief of symptoms during the anesthetic phase of the injection. ?Procedure, treatment alternatives, risks and benefits explained, specific risks discussed. Consent was given by the patient. Immediately prior to procedure a time out was called to verify the correct patient, procedure, equipment, support staff and site/side marked as required. Patient was prepped and  draped in the usual sterile fashion.  ? ?  ?   ? ?Clinical History: ?No specialty comments available.  ? ? ? ?Objective:  VS:  HT:    WT:   BMI:     BP:   HR: bpm  TEMP: ( )  RESP:  ?Physical Exam  ? ?Imaging: ?No results found. ?

## 2021-11-01 ENCOUNTER — Ambulatory Visit: Payer: Medicare HMO | Admitting: Physical Medicine & Rehabilitation

## 2021-12-09 ENCOUNTER — Telehealth: Payer: Self-pay | Admitting: Physical Medicine & Rehabilitation

## 2021-12-09 NOTE — Telephone Encounter (Signed)
Patient would like another ESI if possible at his appt 7/11 please advise if we may change or needs to be a follow up

## 2021-12-11 ENCOUNTER — Ambulatory Visit: Payer: Medicare HMO | Admitting: Orthopaedic Surgery

## 2021-12-24 ENCOUNTER — Encounter: Payer: Self-pay | Admitting: Physical Medicine & Rehabilitation

## 2021-12-24 ENCOUNTER — Encounter: Payer: Medicare HMO | Attending: Physical Medicine & Rehabilitation | Admitting: Physical Medicine & Rehabilitation

## 2021-12-24 VITALS — BP 159/74 | HR 68 | Ht 67.0 in | Wt 250.8 lb

## 2021-12-24 DIAGNOSIS — M5416 Radiculopathy, lumbar region: Secondary | ICD-10-CM | POA: Diagnosis not present

## 2021-12-24 NOTE — Progress Notes (Signed)
  PROCEDURE RECORD [facility]   Name: Samuel [name] DOB:[birth year] MRN: [id number]  Date:12/24/2021  Physician: Alysia Penna, MD    Nurse/[name]: Ronnald Ramp, RMA  Allergies: No Known Allergies  Consent Signed: Yes.    Is patient diabetic? No.  CBG today? .  Pregnant: No. LMP: No LMP for male patient. (age 68-55)  Anticoagulants: no Anti-inflammatory: no Antibiotics: no  Procedure: Left L3-4 Parmedian Translaminar Epidural Steroid Injection  Position: Prone Start Time: 10:25 am  End Time: 10:29 am  Fluoro Time: 23  RN/[name] Tera Helper, RMA    Time 10:05 am 10:37 am    BP 155/69 159/74    Pulse 68 68    Respirations 16 18    O2 Sat 97 96    S/S 6 6    Pain Level 4/10 0/10     D/C home with spouse, patient A & O X 3, D/C instructions reviewed, and sits independently.

## 2021-12-24 NOTE — Patient Instructions (Signed)

## 2021-12-24 NOTE — Progress Notes (Signed)
Left  paramedian L3-4 Lumbar interlaminar epidural steroid injection under fluoroscopic guidance  Indication: Lumbosacral radiculitis is not relieved by medication management or other conservative care and interfering with self-care and mobility.  No  anticoagulant use.  Informed consent was obtained after describing risk and benefits of the procedure with the patient, this includes bleeding, bruising, infection, paralysis and medication side effects.  The patient wishes to proceed and has given written consent.  Patient was placed in a prone position.  The lumbar area was marked and prepped with Betadine.  It was entered with a 25-gauge 1-1/2 inch needle and one mL of 1% lidocaine was injected into the skin and subcutaneous tissue.  Then a 17-gauge spinal needle was inserted under fluoroscopic guidance into the L3-4 interlaminar space under AP and Lateral imaging.  Once needle tip of approximated the posterior elements, a loss of resistance technique was utilized with lateral imaging.  A positive loss of resistance was obtained and then confirmed by injecting 2 mL's of Omnipaque 180.  Then a solution containing 1.5 mL's of '6mg'$ /ml Celestone and 1.5 mL's of 1% lidocaine was injected.  The patient tolerated procedure well.  Post procedure instructions were given.  Please see post procedure form.

## 2021-12-25 ENCOUNTER — Ambulatory Visit (INDEPENDENT_AMBULATORY_CARE_PROVIDER_SITE_OTHER): Payer: Medicare HMO | Admitting: Orthopaedic Surgery

## 2021-12-25 ENCOUNTER — Encounter: Payer: Self-pay | Admitting: Orthopaedic Surgery

## 2021-12-25 DIAGNOSIS — Z96651 Presence of right artificial knee joint: Secondary | ICD-10-CM

## 2021-12-25 DIAGNOSIS — G8929 Other chronic pain: Secondary | ICD-10-CM

## 2021-12-25 DIAGNOSIS — Z96653 Presence of artificial knee joint, bilateral: Secondary | ICD-10-CM

## 2021-12-25 DIAGNOSIS — Z96652 Presence of left artificial knee joint: Secondary | ICD-10-CM | POA: Diagnosis not present

## 2021-12-25 DIAGNOSIS — M25512 Pain in left shoulder: Secondary | ICD-10-CM

## 2021-12-25 NOTE — Progress Notes (Signed)
The patient comes in today for follow-up after having a steroid injection in his left shoulder back 2 months ago.  He says the shoulder is doing great.  We replaced both of his knees last year.  He had a ESI by Dr. Ernestina Patches yesterday.  He says everything is doing well right now he has no complaints.  His left shoulder moves much better.  Both knees move great.  There are no issues on exam today at all.  We will see him back for follow-up visit in 6 months.  We will have an AP and lateral of both knees at that visit.  If there are issues before then he needs to come see [place].

## 2021-12-31 ENCOUNTER — Other Ambulatory Visit: Payer: Self-pay

## 2021-12-31 ENCOUNTER — Encounter: Payer: Self-pay | Admitting: Registered Nurse

## 2021-12-31 ENCOUNTER — Ambulatory Visit (INDEPENDENT_AMBULATORY_CARE_PROVIDER_SITE_OTHER): Payer: Medicare HMO | Admitting: Registered Nurse

## 2021-12-31 VITALS — BP 130/66 | HR 87 | Temp 98.0°F | Resp 18 | Ht 67.0 in | Wt 252.0 lb

## 2021-12-31 DIAGNOSIS — I10 Essential (primary) hypertension: Secondary | ICD-10-CM

## 2021-12-31 DIAGNOSIS — B369 Superficial mycosis, unspecified: Secondary | ICD-10-CM | POA: Diagnosis not present

## 2021-12-31 DIAGNOSIS — D229 Melanocytic nevi, unspecified: Secondary | ICD-10-CM

## 2021-12-31 DIAGNOSIS — I2510 Atherosclerotic heart disease of native coronary artery without angina pectoris: Secondary | ICD-10-CM | POA: Diagnosis not present

## 2021-12-31 LAB — LIPID PANEL
Cholesterol: 172 mg/dL (ref 0–200)
HDL: 44 mg/dL (ref >39.00)
LDL Cholesterol: 108 mg/dL — ABNORMAL HIGH (ref 0–99)
NonHDL: 127.96
Total CHOL/HDL Ratio: 4
Triglycerides: 101 mg/dL (ref 0.0–149.0)
VLDL: 20.2 mg/dL (ref 0.0–40.0)

## 2021-12-31 LAB — COMPREHENSIVE METABOLIC PANEL
ALT: 16 U/L (ref 0–53)
AST: 15 U/L (ref 0–37)
Albumin: 4.4 g/dL (ref 3.5–5.2)
Alkaline Phosphatase: 60 U/L (ref 39–117)
BUN: 13 mg/dL (ref 6–23)
CO2: 29 mEq/L (ref 19–32)
Calcium: 9.6 mg/dL (ref 8.4–10.5)
Chloride: 101 mEq/L (ref 96–112)
Creatinine, Ser: 0.88 mg/dL (ref 0.40–1.50)
GFR: 88.79 mL/min (ref >60.00)
Glucose, Bld: 93 mg/dL (ref 70–99)
Potassium: 4.3 mEq/L (ref 3.5–5.1)
Sodium: 136 mEq/L (ref 135–145)
Total Bilirubin: 0.5 mg/dL (ref 0.2–1.2)
Total Protein: 7.3 g/dL (ref 6.0–8.3)

## 2021-12-31 MED ORDER — CLOTRIMAZOLE-BETAMETHASONE 1-0.05 % EX CREA
1.0000 | TOPICAL_CREAM | Freq: Every day | CUTANEOUS | 1 refills | Status: DC
Start: 2021-12-31 — End: 2022-08-25

## 2021-12-31 NOTE — Patient Instructions (Addendum)
Samuel [name] -   Doristine Devoid to see you  Glad things are going well.  I recommend: Berniece Pap, MD Dimas Chyle, MD Agustina Caroli, MD Myrna Blazer Early, NP Jeralyn Ruths, DNP  Thank you,  Rich    If you have lab work done today you will be contacted with your lab results within the next 2 weeks.  If you have not heard from [place] then please contact us. The fastest way to get your results is to register for My Chart.   IF you received an x-ray today, you will receive an invoice from [facility]. Please contact [facility] at [phone] with questions or concerns regarding your invoice.   IF you received labwork today, you will receive an invoice from [facility]. Please contact [facility] at [phone] with questions or concerns regarding your invoice.   Our billing staff will not be able to assist you with questions regarding bills from these companies.  You will be contacted with the lab results as soon as they are available. The fastest way to get your results is to activate your My Chart account. Instructions are located on the last page of this paperwork. If you have not heard from [place] regarding the results in 2 weeks, please contact this office.

## 2021-12-31 NOTE — Addendum Note (Signed)
Addended by: Maximiano Coss on: 12/31/2021 09:25 AM   Modules accepted: Orders

## 2021-12-31 NOTE — Progress Notes (Signed)
Established Patient Office Visit  Subjective:  Patient ID: Samuel [name], male    DOB: [birth year]  Age: 66 y.o. MRN: [id number]  CC:  Chief Complaint  Patient presents with   Follow-up    Patient states he is here for a follow up and discuss a referral to dermatology.    HPI Samuel [name] presents for follow up   Hypertension: Patient Currently taking: amlodipine '5mg'$  po qd Good effect. No AEs. Denies CV symptoms including: chest pain, shob, doe, headache, visual changes, fatigue, claudication, and dependent edema.   Previous readings and labs: BP Readings from Last 3 Encounters:  12/31/21 130/66  12/24/21 (!) 159/74  09/19/21 (!) 148/74   Lab Results  Component Value Date   CREATININE 0.88 07/17/2021    Anxiety Doing well with fluoxetine '20mg'$  po qd Good effect, no AE. Hopes to continue.  No hi / si  Derm History of benign nevi. Had been seen in the past - years ago - and had some excisions of suspicious spots.   Does note a cyst in the past, this is recurring. No drainage, but does note discomfort when he is leaning back in a chair.  Does note rash in folds on chest. Itching, red, only in skin folds.  Has not treated this year.   Outpatient Medications Prior to Visit  Medication Sig Dispense Refill   acetaminophen (TYLENOL) 500 MG tablet Take 1,000 mg by mouth every 6 (six) hours as needed for moderate pain.     amLODipine (NORVASC) 5 MG tablet Take 1 tablet (5 mg total) by mouth daily. 90 tablet 1   aspirin 81 MG chewable tablet Chew 1 tablet (81 mg total) by mouth 2 (two) times daily. 60 tablet 0   FLUoxetine (PROZAC) 20 MG capsule Take 1-2 capsules (20-40 mg total) by mouth daily. 180 capsule 3   methocarbamol (ROBAXIN) 500 MG tablet Take 1 tablet (500 mg total) by mouth every 6 (six) hours as needed for muscle spasms. (Patient not taking: Reported on 07/17/2021) 40 tablet 1   VITAMIN D PO Take 3 tablets by mouth daily. (Patient not taking: Reported on 07/17/2021)      No facility-administered medications prior to visit.    Review of Systems  Constitutional: Negative.   HENT: Negative.    Eyes: Negative.   Respiratory: Negative.    Cardiovascular: Negative.   Gastrointestinal: Negative.   Genitourinary: Negative.   Musculoskeletal: Negative.   Skin: Negative.   Neurological: Negative.   Psychiatric/Behavioral: Negative.    All other systems reviewed and are negative.     Objective:     BP 130/66   Pulse 87   Temp 98 F (36.7 C) (Temporal)   Resp 18   Ht '5\' 7"'$  (1.702 m)   Wt 252 lb (114.3 kg)   SpO2 99%   BMI 39.47 kg/m   Wt Readings from Last 3 Encounters:  12/31/21 252 lb (114.3 kg)  12/24/21 250 lb 12.8 oz (113.8 kg)  09/19/21 238 lb (108 kg)   Physical Exam Constitutional:      General: He is not in acute distress.    Appearance: Normal appearance. He is normal weight. He is not ill-appearing, toxic-appearing or diaphoretic.  Cardiovascular:     Rate and Rhythm: Normal rate and regular rhythm.     Heart sounds: Normal heart sounds. No murmur heard.    No friction rub. No gallop.  Pulmonary:     Effort: Pulmonary effort is normal. No respiratory  distress.     Breath sounds: Normal breath sounds. No stridor. No wheezing, rhonchi or rales.  Chest:     Chest wall: No tenderness.  Skin:    General: Skin is warm and dry.     Findings: Lesion (multiple benign appearing nevi. sebaceous cyst on back) present.  Neurological:     General: No focal deficit present.     Mental Status: He is alert and oriented to person, place, and time. Mental status is at baseline.  Psychiatric:        Mood and Affect: Mood normal.        Behavior: Behavior normal.        Thought Content: Thought content normal.        Judgment: Judgment normal.     No results found for any visits on 12/31/21.    The 10-year ASCVD risk score (Arnett DK, et al., 2019) is: 17.7%    Assessment & Plan:   Problem List Items Addressed This Visit        Cardiovascular and Mediastinum   Essential hypertension - Primary   Relevant Orders   Comprehensive metabolic panel   Nonobstructive atherosclerosis of coronary artery   Relevant Orders   Lipid panel   Other Visit Diagnoses     Multiple nevi       Relevant Orders   Ambulatory referral to Dermatology       No orders of the defined types were placed in this encounter.   Return in about 6 months (around 07/03/2022) for CPE and labs.   PLAN Refer to derm Refill meds as above. Return in 6 mo for CPE and labs with new provider. Patient encouraged to call clinic with any questions, comments, or concerns.  Maximiano Coss, NP

## 2022-01-03 ENCOUNTER — Other Ambulatory Visit: Payer: Self-pay | Admitting: Registered Nurse

## 2022-01-03 DIAGNOSIS — I10 Essential (primary) hypertension: Secondary | ICD-10-CM

## 2022-01-06 DIAGNOSIS — R32 Unspecified urinary incontinence: Secondary | ICD-10-CM | POA: Diagnosis not present

## 2022-01-13 ENCOUNTER — Telehealth: Payer: Self-pay | Admitting: *Deleted

## 2022-01-13 NOTE — Telephone Encounter (Signed)
(  Late entries for 12/25/21). Patient seen in office with Dr. Ninfa Linden today. Doing well overall from Left total knee done on 10/09/20 and Right total knee done on 03/05/21. Survey completed.

## 2022-01-26 DIAGNOSIS — R32 Unspecified urinary incontinence: Secondary | ICD-10-CM | POA: Diagnosis not present

## 2022-02-13 ENCOUNTER — Telehealth: Payer: Self-pay | Admitting: Physical Medicine & Rehabilitation

## 2022-02-13 NOTE — Telephone Encounter (Signed)
Patient stats Right side is screaming bloody murder he would like to know what can be done to help he was in for an ESI prior on Left and he is doing great. He has had knee replacement and took Vicodin and it did not help

## 2022-02-14 NOTE — Telephone Encounter (Signed)
Notified and he will schedule a follow up.

## 2022-02-16 DIAGNOSIS — R32 Unspecified urinary incontinence: Secondary | ICD-10-CM | POA: Diagnosis not present

## 2022-02-19 ENCOUNTER — Telehealth: Payer: Self-pay | Admitting: Physical Medicine and Rehabilitation

## 2022-02-19 NOTE — Telephone Encounter (Signed)
Pt called said the shoulder injection worked great for him so he was wanting to see [place] for his back  CB [phone]

## 2022-03-10 ENCOUNTER — Encounter: Payer: Self-pay | Admitting: Physical Medicine and Rehabilitation

## 2022-03-10 ENCOUNTER — Ambulatory Visit (INDEPENDENT_AMBULATORY_CARE_PROVIDER_SITE_OTHER): Payer: Medicare HMO | Admitting: Physical Medicine and Rehabilitation

## 2022-03-10 ENCOUNTER — Ambulatory Visit: Payer: Medicare HMO | Admitting: Physical Medicine and Rehabilitation

## 2022-03-10 VITALS — BP 156/86 | HR 69 | Wt 252.0 lb

## 2022-03-10 DIAGNOSIS — M5416 Radiculopathy, lumbar region: Secondary | ICD-10-CM

## 2022-03-10 DIAGNOSIS — G8929 Other chronic pain: Secondary | ICD-10-CM

## 2022-03-10 DIAGNOSIS — M5441 Lumbago with sciatica, right side: Secondary | ICD-10-CM | POA: Diagnosis not present

## 2022-03-10 DIAGNOSIS — M48062 Spinal stenosis, lumbar region with neurogenic claudication: Secondary | ICD-10-CM

## 2022-03-10 DIAGNOSIS — M4726 Other spondylosis with radiculopathy, lumbar region: Secondary | ICD-10-CM | POA: Diagnosis not present

## 2022-03-10 NOTE — Progress Notes (Unsigned)
Right sided buttock pain rad down to foot. Numbness in foot following right total knee replacement.  Rates pain 8 constant al the time Ibuprofen for pain daily

## 2022-03-10 NOTE — Progress Notes (Unsigned)
Samuel [name] - 66 y.o. male MRN [id number]  Date of birth: Aug 24, 1955  Office Visit Note: Visit Date: 03/10/2022 PCP: Maximiano Coss, NP Referred by: Maximiano Coss, NP  Subjective: No chief complaint on file.  HPI: Samuel [name] is a 66 y.o. male who comes in today for evaluation of chronic, worsening and severe right sided lower back pain radiating to buttock and down right leg. Pain ongoing for several years, pain increased over the last couple of months. He describes pain as a numbness/tingling sensation, currently rates as 9 out of 10. States severe pain wakes him up at night. Some relief of pain with home exercise regimen, rest and use of medications. History of formal physical therapy at [facility], no relief of pain with these treatments. Lumbar MRI imaging from 2021 exhibits multilevel facet arthropathy, severe spinal canal stenosis at L4-L5 and mild to moderate spinal canal stenosis at L3-L4. Patient has history of multiple lumbar epidural steroid injections at [facility] with significant and sustained relief. He is currently being treated by Dr. Alysia Penna at [facility], most recent injection was left L3-L4 interlaminar epidural steroid injection in July that provided good relief of pain until recently. Patient has history of left glenohumeral injection in our office. Patient states he is not able to get back in with Dr. Letta Pate until end of October and would like to have injection as soon as possible. 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 Patient denies focal weakness. Patient denies recent trauma or falls.    Review of Systems  Musculoskeletal:  Positive for back pain.  Neurological:  Positive for tingling and sensory change. Negative for focal weakness and weakness.  All other systems reviewed and are negative.  Otherwise per HPI.  Assessment & Plan: Visit Diagnoses:     ICD-10-CM   1. Chronic right-sided low back pain with right-sided sciatica  M54.41    G89.29     2. Lumbar radiculopathy  M54.16     3. Spinal stenosis of lumbar region with neurogenic claudication  M48.062     4. Other spondylosis with radiculopathy, lumbar region  M47.26        Plan: Findings:  Chronic, worsening and severe right sided lower back pain radiating to buttock and down right leg. Patient continues to have severe pain despite good conservative therapies such as formal physical therapy, home exercise regimen, rest and use of medications. Patients clinical presentation and exam are consistent with neurogenic claudication as a result of spinal canal stenosis. There is severe spinal canal stenosis noted at the level of L4-L5. Next step is to perform diagnostic and hopefully therapeutic right L3-L4 interlaminar epidural steroid injection under fluoroscopic guidance.  If patient gets significant and sustained relief with lumbar epidural steroid injection we did discuss the possibility of repeating this procedure infrequently as needed.  If his pain persists post injection we could look at performing a transforaminal approach.  I also discussed possible surgical consultation, could be a candidate for decompressive lumbar laminectomy. If we are unable to get patient in quickly, I did speak with him about referral to [facility] for injection, he is agreeable with plan. No red flag symptoms noted upon exam today.     Meds & Orders: No orders of the defined types were placed in this encounter.  No orders of the defined types were placed in this encounter.   Follow-up: Return for  Right L3-L4 interlaminar epidural steroid injection.   Procedures: No procedures performed      Clinical History: EXAM: MRI LUMBAR SPINE WITHOUT CONTRAST   TECHNIQUE: Multiplanar, multisequence MR imaging of the lumbar spine was performed. No intravenous contrast was administered.   COMPARISON:   Plain films May 02, 2019   FINDINGS: Segmentation:  Standard.   Alignment:  Physiologic.   Vertebrae: No fracture, evidence of discitis, or bone lesion. Mild diffuse decrease of the T1 signal throughout the visualized spine, nonspecific, may represent red marrow reconversion.   Conus medullaris and cauda equina: Conus extends to the L1 level. Conus and cauda equina appear normal.   Paraspinal and other soft tissues: Negative.   Disc levels:   T12-L1: No spinal canal or neural foraminal stenosis.   L1-2: No spinal canal or neural foraminal stenosis.   L2-3: Mild loss of disc height, mild facet degenerative changes with bilateral joint effusion and ligamentum flavum redundancy, resulting in mild spinal canal stenosis and mild bilateral neural foraminal narrowing.   L3-4: Disc bulge with superimposed small left foraminal central disc protrusion abutting the exiting left L3 nerve root. There also facet degenerative change ligamentum flavum redundancy contributing for mild to moderate spinal canal stenosis, narrowing of the subarticular zones and mild bilateral neural foraminal narrowing.   L4-5: Disc bulge, prominent facet degenerative changes ligamentum flavum redundancy resulting in severe spinal canal stenosis and mild bilateral neural foraminal narrowing.   L5-S1: Mild facet degenerative changes. No spinal canal or neural foraminal stenosis.   IMPRESSION: 1. Lumbar spondylosis most pronounced at L4-5 where there is severe spinal canal stenosis and mild bilateral neural foraminal narrowing. 2. At L3-4 there is a small left foraminal central disc protrusion abutting the exiting left L3 nerve root. There is also mild to moderate spinal canal stenosis, narrowing of the subarticular zones and mild bilateral neural foraminal narrowing at this level. 3. Mild spinal canal stenosis and mild bilateral neural foraminal narrowing at L2-3. 4. Mild diffuse decrease of the T1  signal throughout the visualized spine, nonspecific, may represent red marrow reconversion.     Electronically Signed   By: Pedro Earls M.D.   On: 11/21/2019 09:08   He reports that he quit smoking about 23 years ago. His smoking use included cigarettes. He has a 60.00 pack-year smoking history. He has never used smokeless tobacco.  Recent Labs    07/17/21 0829  HGBA1C 5.9    Objective:  VS:  HT:    WT:252 lb (114.3 kg)  BMI:     BP: (!) 156/86  HR:69bpm  TEMP: ( )  RESP:  Physical Exam Vitals and nursing note reviewed.  HENT:     Head: Normocephalic and atraumatic.     Right Ear: External ear normal.     Left Ear: External ear normal.     Nose: Nose normal.     Mouth/Throat:     Mouth: Mucous membranes are moist.  Eyes:     Extraocular Movements: Extraocular movements intact.  Cardiovascular:     Rate and Rhythm: Normal rate.     Pulses: Normal pulses.  Pulmonary:     Effort: Pulmonary effort is normal.  Abdominal:     General: Abdomen is flat. There is no distension.  Musculoskeletal:        General: Tenderness present.     Cervical back: Normal range of motion.     Comments: Pt is slow to rise from seated position to standing. Good lumbar  range of motion. Strong distal strength without clonus, no pain upon palpation of greater trochanters. Sensation intact bilaterally. Walks independently, gait steady.   Skin:    General: Skin is warm and dry.     Capillary Refill: Capillary refill takes less than 2 seconds.  Neurological:     General: No focal deficit present.     Mental Status: He is alert and oriented to person, place, and time.  Psychiatric:        Mood and Affect: Mood normal.        Behavior: Behavior normal.     Ortho Exam  Imaging: No results found.  Past Medical/Family/Surgical/Social History: Medications & Allergies reviewed per EMR, new medications updated. Patient Active Problem List   Diagnosis Date Noted   Anxiety  state 07/02/2021   Status post total right knee replacement 03/05/2021   Status post left knee replacement 10/09/2020   Unilateral primary osteoarthritis, left knee 06/22/2018   Unilateral primary osteoarthritis, right knee 06/22/2018   Screening for colorectal cancer 05/17/2018   Nonobstructive atherosclerosis of coronary artery 03/05/2018   Obesity (BMI 30-39.9) 03/05/2018   Former heavy cigarette smoker (20-39 per day) 03/05/2018   Varicose veins of bilateral lower extremities with pain 67/61/9509   Umbilical hernia 32/67/1245   Alcohol abuse, in remission 09/17/2017   Osteoarthritis of knee 08/10/2017   Rosacea 06/11/2017   Subclinical hypothyroidism 06/05/2017   Essential hypertension 05/27/2017   Bilateral hearing loss 05/04/2014   Past Medical History:  Diagnosis Date   Anxiety    Arthritis    Basal cell carcinoma 04/29/2018   upper mid back, right mid back    Basal cell carcinoma 04/29/2018   right mid back    Basal cell carcinoma 06/10/2018   left lover back    Bronchitis    Coronary artery disease    Mild-moderate non-obstructive CAD 06/15/17 (20% dLM, 50% mLAD, 30% OM3, 40% mRCA, 40% dRCA)   COVID-19 unknown   Depression    Former heavy cigarette smoker (20-39 per day) 03/05/2018   Quit 2000; 60 pak year history   History of hiatal hernia    HTN (hypertension)    Substance abuse ([place])    4 years as of march 2023   Family History  Problem Relation Age of Onset   Dementia Mother        alcohol   Alcohol abuse Mother    Heart attack Father 86   Heart disease Father 76   COPD Father    Stroke Father    Healthy Daughter    Healthy Son    Healthy Son    CAD Brother    Heart attack Brother    Past Surgical History:  Procedure Laterality Date   HERNIA REPAIR     KNEE ARTHROSCOPY Bilateral    LEFT HEART CATH AND CORONARY ANGIOGRAPHY N/A 06/15/2017   Procedure: LEFT HEART CATH AND CORONARY ANGIOGRAPHY;  Surgeon: Nelva Bush, MD;  Location: [facility];  Service: Cardiovascular;  Laterality: N/A;   TONSILLECTOMY     TOTAL KNEE ARTHROPLASTY Left 10/09/2020   Procedure: LEFT TOTAL KNEE ARTHROPLASTY;  Surgeon: Mcarthur Rossetti, MD;  Location: [facility];  Service: Orthopedics;  Laterality: Left;   TOTAL KNEE ARTHROPLASTY Right 03/05/2021   Procedure: RIGHT TOTAL KNEE ARTHROPLASTY;  Surgeon: Mcarthur Rossetti, MD;  Location: [facility];  Service: Orthopedics;  Laterality: Right;   UMBILICAL HERNIA REPAIR N/A 11/03/2017   Procedure: UMBILICAL HERNIA REPAIR;  Surgeon: Clovis Riley,  MD;  Location: [facility];  Service: General;  Laterality: N/A;   Social History   Occupational History   Occupation: disabled  Tobacco Use   Smoking status: Former    Packs/day: 2.00    Years: 30.00    Total pack years: 60.00    Types: Cigarettes    Quit date: 06/16/1998    Years since quitting: 23.7   Smokeless tobacco: Never  Vaping Use   Vaping Use: Never used  Substance and Sexual Activity   Alcohol use: No    Comment: sober 08-28-2017   Drug use: Not Currently   Sexual activity: Yes    Partners: Female

## 2022-03-16 DIAGNOSIS — R32 Unspecified urinary incontinence: Secondary | ICD-10-CM | POA: Diagnosis not present

## 2022-04-04 ENCOUNTER — Encounter: Payer: Medicare HMO | Attending: Physical Medicine & Rehabilitation | Admitting: Physical Medicine & Rehabilitation

## 2022-04-04 DIAGNOSIS — M5416 Radiculopathy, lumbar region: Secondary | ICD-10-CM | POA: Insufficient documentation

## 2022-05-29 DIAGNOSIS — C44619 Basal cell carcinoma of skin of left upper limb, including shoulder: Secondary | ICD-10-CM | POA: Diagnosis not present

## 2022-05-29 DIAGNOSIS — L723 Sebaceous cyst: Secondary | ICD-10-CM | POA: Diagnosis not present

## 2022-05-29 DIAGNOSIS — C44519 Basal cell carcinoma of skin of other part of trunk: Secondary | ICD-10-CM | POA: Diagnosis not present

## 2022-05-29 DIAGNOSIS — L988 Other specified disorders of the skin and subcutaneous tissue: Secondary | ICD-10-CM | POA: Diagnosis not present

## 2022-05-29 DIAGNOSIS — L821 Other seborrheic keratosis: Secondary | ICD-10-CM | POA: Diagnosis not present

## 2022-05-29 DIAGNOSIS — D485 Neoplasm of uncertain behavior of skin: Secondary | ICD-10-CM | POA: Diagnosis not present

## 2022-05-29 DIAGNOSIS — D225 Melanocytic nevi of trunk: Secondary | ICD-10-CM | POA: Diagnosis not present

## 2022-05-29 DIAGNOSIS — D2371 Other benign neoplasm of skin of right lower limb, including hip: Secondary | ICD-10-CM | POA: Diagnosis not present

## 2022-07-03 DIAGNOSIS — H2512 Age-related nuclear cataract, left eye: Secondary | ICD-10-CM | POA: Diagnosis not present

## 2022-07-03 DIAGNOSIS — H25811 Combined forms of age-related cataract, right eye: Secondary | ICD-10-CM | POA: Diagnosis not present

## 2022-07-03 DIAGNOSIS — H16423 Pannus (corneal), bilateral: Secondary | ICD-10-CM | POA: Diagnosis not present

## 2022-07-03 DIAGNOSIS — H5211 Myopia, right eye: Secondary | ICD-10-CM | POA: Diagnosis not present

## 2022-07-03 DIAGNOSIS — H52223 Regular astigmatism, bilateral: Secondary | ICD-10-CM | POA: Diagnosis not present

## 2022-07-03 DIAGNOSIS — H5202 Hypermetropia, left eye: Secondary | ICD-10-CM | POA: Diagnosis not present

## 2022-07-03 DIAGNOSIS — H524 Presbyopia: Secondary | ICD-10-CM | POA: Diagnosis not present

## 2022-07-03 DIAGNOSIS — D3132 Benign neoplasm of left choroid: Secondary | ICD-10-CM | POA: Diagnosis not present

## 2022-07-21 DIAGNOSIS — H524 Presbyopia: Secondary | ICD-10-CM | POA: Diagnosis not present

## 2022-07-31 ENCOUNTER — Other Ambulatory Visit: Payer: Self-pay

## 2022-07-31 ENCOUNTER — Telehealth: Payer: Self-pay | Admitting: Registered Nurse

## 2022-07-31 DIAGNOSIS — I10 Essential (primary) hypertension: Secondary | ICD-10-CM

## 2022-07-31 MED ORDER — AMLODIPINE BESYLATE 5 MG PO TABS: 5.0000 mg | ORAL_TABLET | Freq: Every day | ORAL | 0 refills | Status: DC

## 2022-07-31 NOTE — Telephone Encounter (Signed)
Pt called in to the office stating that he is out of his BP medication but is at [facility] and will not be able to come to an available appointment on 08/01/2022 with Dr. Carlota Raspberry to obtain a refill.  He was wanting to see if it can be called in so that he will not be without until his appointment on 08/25/2022 with the new provider. The pharmacy that he uses [facility] [employer] in [place].

## 2022-08-07 ENCOUNTER — Other Ambulatory Visit: Payer: Self-pay

## 2022-08-07 DIAGNOSIS — I10 Essential (primary) hypertension: Secondary | ICD-10-CM

## 2022-08-20 ENCOUNTER — Other Ambulatory Visit: Payer: Self-pay

## 2022-08-20 DIAGNOSIS — I10 Essential (primary) hypertension: Secondary | ICD-10-CM

## 2022-08-20 MED ORDER — AMLODIPINE BESYLATE 5 MG PO TABS: 5.0000 mg | ORAL_TABLET | Freq: Every day | ORAL | 0 refills | Status: DC

## 2022-08-25 ENCOUNTER — Telehealth: Payer: Self-pay

## 2022-08-25 ENCOUNTER — Ambulatory Visit (INDEPENDENT_AMBULATORY_CARE_PROVIDER_SITE_OTHER): Payer: Medicare HMO | Admitting: Family Medicine

## 2022-08-25 ENCOUNTER — Encounter: Payer: Self-pay | Admitting: Family Medicine

## 2022-08-25 VITALS — BP 128/64 | HR 69 | Temp 98.7°F | Ht 67.0 in | Wt 263.2 lb

## 2022-08-25 DIAGNOSIS — I10 Essential (primary) hypertension: Secondary | ICD-10-CM | POA: Diagnosis not present

## 2022-08-25 DIAGNOSIS — Z7689 Persons encountering health services in other specified circumstances: Secondary | ICD-10-CM

## 2022-08-25 DIAGNOSIS — Z13220 Encounter for screening for lipoid disorders: Secondary | ICD-10-CM

## 2022-08-25 DIAGNOSIS — Z6841 Body Mass Index (BMI) 40.0 and over, adult: Secondary | ICD-10-CM | POA: Diagnosis not present

## 2022-08-25 DIAGNOSIS — F411 Generalized anxiety disorder: Secondary | ICD-10-CM | POA: Diagnosis not present

## 2022-08-25 LAB — LIPID PANEL
Cholesterol: 185 mg/dL (ref 0–200)
HDL: 40.9 mg/dL (ref >39.00)
LDL Cholesterol: 123 mg/dL — ABNORMAL HIGH (ref 0–99)
NonHDL: 144.12
Total CHOL/HDL Ratio: 5
Triglycerides: 106 mg/dL (ref 0.0–149.0)
VLDL: 21.2 mg/dL (ref 0.0–40.0)

## 2022-08-25 LAB — COMPREHENSIVE METABOLIC PANEL
ALT: 17 U/L (ref 0–53)
AST: 16 U/L (ref 0–37)
Albumin: 4.2 g/dL (ref 3.5–5.2)
Alkaline Phosphatase: 61 U/L (ref 39–117)
BUN: 15 mg/dL (ref 6–23)
CO2: 30 mEq/L (ref 19–32)
Calcium: 9.5 mg/dL (ref 8.4–10.5)
Chloride: 100 mEq/L (ref 96–112)
Creatinine, Ser: 0.96 mg/dL (ref 0.40–1.50)
GFR: 81.24 mL/min (ref >60.00)
Glucose, Bld: 96 mg/dL (ref 70–99)
Potassium: 4.6 mEq/L (ref 3.5–5.1)
Sodium: 137 mEq/L (ref 135–145)
Total Bilirubin: 0.3 mg/dL (ref 0.2–1.2)
Total Protein: 7.4 g/dL (ref 6.0–8.3)

## 2022-08-25 NOTE — Assessment & Plan Note (Signed)
Blood pressure is stable with no complaints. Continue with Amlodipine '5mg'$  daily. Ordered CMP.

## 2022-08-25 NOTE — Progress Notes (Signed)
Left Voicemail for pt tpo call office to discuss lab results.   [place]  [phone]

## 2022-08-25 NOTE — Patient Instructions (Signed)
It was a pleasure to meet you today and I look forward to taking care of you.  Continue with Amlodipine for blood pressure.  Continue Prozac for anxiety.  Follow up in 6 months for a physical.

## 2022-08-25 NOTE — Progress Notes (Signed)
New Patient Office Visit  Subjective    Patient ID: Samuel [name], male    DOB: 02/09/1956  Age: 67 y.o. MRN: [id number]  CC:  Chief Complaint  Patient presents with   Establish Care    Pt is here today to [facility]. Pt has no concerns today. Pt is not FASTING    HPI Samuel [name] presents to establish care with new provider.   HTN: Chronic. Patient takes Amlodipine '5mg'$  daily.  He denies that he monitors his blood pressure at home. Denies chest pain, SHOB, headaches, dizziness, lightheadedness, and lower extremity edema. Eye exam about a month ago, [facility] [place].   Anxiety: Chronic. Patient takes Prozac '20mg'$  daily. He reports it helps with his anxiety. No complaints. Denies SI or HI.   Outpatient Encounter Medications as of 08/25/2022  Medication Sig   amLODipine (NORVASC) 5 MG tablet Take 1 tablet (5 mg total) by mouth daily.   FLUoxetine (PROZAC) 20 MG capsule Take 1-2 capsules (20-40 mg total) by mouth daily.   [DISCONTINUED] acetaminophen (TYLENOL) 500 MG tablet Take 1,000 mg by mouth every 6 (six) hours as needed for moderate pain. (Patient not taking: Reported on 08/25/2022)   [DISCONTINUED] aspirin 81 MG chewable tablet Chew 1 tablet (81 mg total) by mouth 2 (two) times daily. (Patient not taking: Reported on 08/25/2022)   [DISCONTINUED] clotrimazole-betamethasone (LOTRISONE) cream Apply 1 Application topically daily. (Patient not taking: Reported on 08/25/2022)   [DISCONTINUED] hydrochlorothiazide (HYDRODIURIL) 25 MG tablet    [DISCONTINUED] HYDROcodone-Acetaminophen 5-300 MG TABS Take 1 tablet every 4 hours by oral route. (Patient not taking: Reported on 08/25/2022)   [DISCONTINUED] metroNIDAZOLE (METROGEL) 1 % gel  (Patient not taking: Reported on 08/25/2022)   [DISCONTINUED] omeprazole (PRILOSEC) 20 MG capsule  (Patient not taking: Reported on 08/25/2022)   [DISCONTINUED] POTASSIUM CHLORIDE PO  (Patient not taking: Reported on 08/25/2022)   [DISCONTINUED] sildenafil (VIAGRA) 100  MG tablet    No facility-administered encounter medications on file as of 08/25/2022.    Past Medical History:  Diagnosis Date   Anxiety    Arthritis    Basal cell carcinoma 04/29/2018   upper mid back, right mid back    Basal cell carcinoma 04/29/2018   right mid back    Basal cell carcinoma 06/10/2018   left lover back    Bronchitis    Cataract    Coronary artery disease    Mild-moderate non-obstructive CAD 06/15/17 (20% dLM, 50% mLAD, 30% OM3, 40% mRCA, 40% dRCA)   COVID-19 unknown   Depression    Former heavy cigarette smoker (20-39 per day) 03/05/2018   Quit 2000; 60 pak year history   History of hiatal hernia    HTN (hypertension)    Substance abuse ([place])    4 years as of march 2023    Past Surgical History:  Procedure Laterality Date   HERNIA REPAIR     KNEE ARTHROSCOPY Bilateral    LEFT HEART CATH AND CORONARY ANGIOGRAPHY N/A 06/15/2017   Procedure: LEFT HEART CATH AND CORONARY ANGIOGRAPHY;  Surgeon: Nelva Bush, MD;  Location: [facility] [facility];  Service: Cardiovascular;  Laterality: N/A;   REPLACEMENT TOTAL KNEE BILATERAL     TONSILLECTOMY     TOTAL KNEE ARTHROPLASTY Left 10/09/2020   Procedure: LEFT TOTAL KNEE ARTHROPLASTY;  Surgeon: Mcarthur Rossetti, MD;  Location: [facility];  Service: Orthopedics;  Laterality: Left;   TOTAL KNEE ARTHROPLASTY Right 03/05/2021   Procedure: RIGHT TOTAL KNEE ARTHROPLASTY;  Surgeon: Mcarthur Rossetti,  MD;  Location: [facility];  Service: Orthopedics;  Laterality: Right;   UMBILICAL HERNIA REPAIR N/A 11/03/2017   Procedure: UMBILICAL HERNIA REPAIR;  Surgeon: Clovis Riley, MD;  [facility];  Service: General;  Laterality: N/A;    Family History  Problem Relation Age of Onset   Dementia Mother        alcohol   Alcohol abuse Mother    Heart attack Father 48   Heart disease Father 36   COPD Father    Stroke Father    CAD Brother    Heart attack Brother    Healthy Daughter    Healthy Son    Healthy Son      Social History   Socioeconomic History   Marital status: Married    Spouse name: Not on file   Number of children: 3   Years of education: Not on file   Highest education level: Bachelor's degree (e.g., BA, AB, BS)  Occupational History   Occupation: disabled  Tobacco Use   Smoking status: Former    Packs/day: 2.00    Years: 30.00    Total pack years: 60.00    Types: Cigarettes    Quit date: 06/16/1998    Years since quitting: 24.2   Smokeless tobacco: Never  Vaping Use   Vaping Use: Never used  Substance and Sexual Activity   Alcohol use: No    Comment: sober 08-28-2017   Drug use: Not Currently   Sexual activity: Yes    Partners: Female  Other Topics Concern   Not on file  Social History Narrative   Patient lives with spouse.  1 dog.    Social Determinants of Health   Financial Resource Strain: Low Risk  (12/27/2019)   Overall Financial Resource Strain (CARDIA)    Difficulty of Paying Living Expenses: Not hard at all  Food Insecurity: No Food Insecurity (12/27/2019)   Hunger Vital Sign    Worried About Running Out of Food in the Last Year: Never true    Ran Out of Food in the Last Year: Never true  Transportation Needs: No Transportation Needs (12/27/2019)   PRAPARE - Hydrologist (Medical): No    Lack of Transportation (Non-Medical): No  Physical Activity: Sufficiently Active (12/27/2019)   Exercise Vital Sign    Days of Exercise per Week: 5 days    Minutes of Exercise per Session: 60 min  Stress: No Stress Concern Present (12/27/2019)   [place]    Feeling of Stress : Not at all  Social Connections: Not on file  Intimate Partner Violence: Not At Risk (12/27/2019)   Humiliation, Afraid, Rape, and Kick questionnaire    Fear of Current or Ex-Partner: No    Emotionally Abused: No    Physically Abused: No    Sexually Abused: No    ROS See HPI above    Objective     BP 128/64   Pulse 69   Temp 98.7 F (37.1 C)   Ht '5\' 7"'$  (1.702 m)   Wt 263 lb 4 oz (119.4 kg)   SpO2 96%   BMI 41.23 kg/m   Physical Exam Vitals reviewed.  Constitutional:      General: He is not in acute distress.    Appearance: Normal appearance. He is obese. He is not ill-appearing, toxic-appearing or diaphoretic.  Eyes:     General:        Right eye: No  discharge.        Left eye: No discharge.     Conjunctiva/sclera: Conjunctivae normal.  Cardiovascular:     Rate and Rhythm: Normal rate and regular rhythm.     Heart sounds: Normal heart sounds. No murmur heard.    No friction rub. No gallop.  Pulmonary:     Effort: Pulmonary effort is normal. No respiratory distress.     Breath sounds: Normal breath sounds.  Musculoskeletal:        General: Normal range of motion.     Right lower leg: No edema.     Left lower leg: No edema.  Skin:    General: Skin is warm and dry.  Neurological:     General: No focal deficit present.     Mental Status: He is alert and oriented to person, place, and time. Mental status is at baseline.  Psychiatric:        Mood and Affect: Mood normal.        Behavior: Behavior normal.        Thought Content: Thought content normal.        Judgment: Judgment normal.     Assessment & Plan:  Essential hypertension Assessment & Plan: Blood pressure is stable with no complaints. Continue with Amlodipine '5mg'$  daily. Ordered CMP.   Orders: -     Comprehensive metabolic panel  Anxiety state Assessment & Plan: Continue taking Prozac '20mg'$  daily. No concerns with taking medication and reports it helps control his anxiety.    Lipid screening -     Lipid panel  Establishing care with new doctor, encounter for  1.Needs Medicare AWV.  2.Reviewed health maintenance: Unsure if he has had has influenza vaccine and covid booster-request records from [employer].  3. Ordered lipid panel for screening. Patient is not on a statin  therapy. Last seen [place] with Dr. Joaquim Nam in 2019. He was only suppose to follow up as needed.   Return in about 6 months (around 02/25/2023) for physical.   Valarie Merino, NP

## 2022-08-25 NOTE — Telephone Encounter (Signed)
-----   Message from Evangeline Gula, NP sent at 08/25/2022  1:02 PM EDT ----- LDL cholesterol is slightly elevated, with a 10-year ASCVD risk score (Arnett DK, et al., 2019) is: 19.2%    Values used to calculate the score:     Age: 67 years     Sex: Male     Is Non-Hispanic African American: No     Diabetic: No     Tobacco smoker: No     Systolic Blood Pressure: 712 mmHg     Is BP treated: Yes     HDL Cholesterol: 40.9 mg/dL     Total Cholesterol: 185 mg/dL  With ASCVD score being higher than 10%, recommend statin therapy. Rosuvastatin 10mg , 1 tablet at bedtime. If he agrees with medication, will need CMP in 6 weeks. CBC is normal.

## 2022-08-25 NOTE — Telephone Encounter (Signed)
Left pt a VM in regards to his lab results

## 2022-08-25 NOTE — Assessment & Plan Note (Signed)
Continue taking Prozac '20mg'$  daily. No concerns with taking medication and reports it helps control his anxiety.

## 2022-08-26 NOTE — Telephone Encounter (Signed)
Called patitnt to discuss recommendation not to repeat with him but no answer LM to call back

## 2022-08-26 NOTE — Telephone Encounter (Signed)
Pt called back and states he wasn't fasting for his labs and wants them re done while fasting bc he feels this is inaccurate

## 2022-08-26 NOTE — Telephone Encounter (Signed)
Called and left another message

## 2022-08-29 ENCOUNTER — Other Ambulatory Visit: Payer: Self-pay

## 2022-08-29 DIAGNOSIS — F411 Generalized anxiety disorder: Secondary | ICD-10-CM

## 2022-08-29 MED ORDER — FLUOXETINE HCL 20 MG PO CAPS: 20.0000 mg | ORAL_CAPSULE | Freq: Every day | ORAL | 3 refills | Status: AC

## 2022-09-04 ENCOUNTER — Telehealth: Payer: Self-pay | Admitting: Family Medicine

## 2022-09-04 NOTE — Telephone Encounter (Signed)
Incontinence Order placed in front bin.

## 2022-09-04 NOTE — Telephone Encounter (Signed)
Formed was signed and faxed 09/04/2022

## 2022-09-04 NOTE — Telephone Encounter (Signed)
Samuel [name] has the form.

## 2022-09-10 ENCOUNTER — Telehealth: Payer: Self-pay | Admitting: Family Medicine

## 2022-09-10 NOTE — Telephone Encounter (Signed)
[facility] to schedule their annual wellness visit. Appointment made for 09/17/2022.  Thank you,  [place] Direct dial  [phone]

## 2022-09-11 ENCOUNTER — Other Ambulatory Visit: Payer: Self-pay

## 2022-09-11 DIAGNOSIS — I10 Essential (primary) hypertension: Secondary | ICD-10-CM

## 2022-09-11 MED ORDER — AMLODIPINE BESYLATE 5 MG PO TABS: 5.0000 mg | ORAL_TABLET | Freq: Every day | ORAL | 3 refills | Status: DC

## 2022-09-17 ENCOUNTER — Ambulatory Visit (INDEPENDENT_AMBULATORY_CARE_PROVIDER_SITE_OTHER): Payer: Medicare HMO | Admitting: Family Medicine

## 2022-09-17 DIAGNOSIS — Z0000 Encounter for general adult medical examination without abnormal findings: Secondary | ICD-10-CM

## 2022-09-17 NOTE — Patient Instructions (Signed)
[name] , Thank you for taking time to come for your Medicare Wellness Visit. I appreciate your ongoing commitment to your health goals. Please review the following plan we discussed and let me know if I can assist you in the future.   Screening recommendations/referrals: Colonoscopy: up to date Recommended yearly ophthalmology/optometry visit for glaucoma screening and checkup Recommended yearly dental visit for hygiene and checkup  Vaccinations: Influenza vaccine: up to date Pneumococcal vaccine: up to date Tdap vaccine: up to date Shingles vaccine: education provided      Preventive Care 69 Years and Older, Male Preventive care refers to lifestyle choices and visits with your health care provider that can promote health and wellness. What does preventive care include? A yearly physical exam. This is also called an annual well check. Dental exams once or twice a year. Routine eye exams. Ask your health care provider how often you should have your eyes checked. Personal lifestyle choices, including: Daily care of your teeth and gums. Regular physical activity. Eating a healthy diet. Avoiding tobacco and drug use. Limiting alcohol use. Practicing safe sex. Taking low doses of aspirin every day. Taking vitamin and mineral supplements as recommended by your health care provider. What happens during an annual well check? The services and screenings done by your health care provider during your annual well check will depend on your age, overall health, lifestyle risk factors, and family history of disease. Counseling  Your health care provider may ask you questions about your: Alcohol use. Tobacco use. Drug use. Emotional well-being. Home and relationship well-being. Sexual activity. Eating habits. History of falls. Memory and ability to understand (cognition). Work and work Statistician. Screening  You may have the following tests or measurements: Height, weight, and  BMI. Blood pressure. Lipid and cholesterol levels. These may be checked every 5 years, or more frequently if you are over 26 years old. Skin check. Lung cancer screening. You may have this screening every year starting at age 63 if you have a 30-pack-year history of smoking and currently smoke or have quit within the past 15 years. Fecal occult blood test (FOBT) of the stool. You may have this test every year starting at age 32. Flexible sigmoidoscopy or colonoscopy. You may have a sigmoidoscopy every 5 years or a colonoscopy every 10 years starting at age 42. Prostate cancer screening. Recommendations will vary depending on your family history and other risks. Hepatitis C blood test. Hepatitis B blood test. Sexually transmitted disease (STD) testing. Diabetes screening. This is done by checking your blood sugar (glucose) after you have not eaten for a while (fasting). You may have this done every 1-3 years. Abdominal aortic aneurysm (AAA) screening. You may need this if you are a current or former smoker. Osteoporosis. You may be screened starting at age 93 if you are at high risk. Talk with your health care provider about your test results, treatment options, and if necessary, the need for more tests. Vaccines  Your health care provider may recommend certain vaccines, such as: Influenza vaccine. This is recommended every year. Tetanus, diphtheria, and acellular pertussis (Tdap, Td) vaccine. You may need a Td booster every 10 years. Zoster vaccine. You may need this after age 99. Pneumococcal 13-valent conjugate (PCV13) vaccine. One dose is recommended after age 22. Pneumococcal polysaccharide (PPSV23) vaccine. One dose is recommended after age 88. Talk to your health care provider about which screenings and vaccines you need and how often you need them. This information is not intended to  replace advice given to you by your health care provider. Make sure you discuss any questions you have  with your health care provider. Document Released: 06/29/2015 Document Revised: 02/20/2016 Document Reviewed: 04/03/2015 Elsevier Interactive Patient Education  2017 [place] Prevention in the Home Falls can cause injuries. They can happen to people of all ages. There are many things you can do to make your home safe and to help prevent falls. What can I do on the outside of my home? Regularly fix the edges of walkways and driveways and fix any cracks. Remove anything that might make you trip as you walk through a door, such as a raised step or threshold. Trim any bushes or trees on the path to your home. Use bright outdoor lighting. Clear any walking paths of anything that might make someone trip, such as rocks or tools. Regularly check to see if handrails are loose or broken. Make sure that both sides of any steps have handrails. Any raised decks and porches should have guardrails on the edges. Have any leaves, snow, or ice cleared regularly. Use sand or salt on walking paths during winter. Clean up any spills in your garage right away. This includes oil or grease spills. What can I do in the bathroom? Use night lights. Install grab bars by the toilet and in the tub and shower. Do not use towel bars as grab bars. Use non-skid mats or decals in the tub or shower. If you need to sit down in the shower, use a plastic, non-slip stool. Keep the floor dry. Clean up any water that spills on the floor as soon as it happens. Remove soap buildup in the tub or shower regularly. Attach bath mats securely with double-sided non-slip rug tape. Do not have throw rugs and other things on the floor that can make you trip. What can I do in the bedroom? Use night lights. Make sure that you have a light by your bed that is easy to reach. Do not use any sheets or blankets that are too big for your bed. They should not hang down onto the floor. Have a firm chair that has side arms. You can use  this for support while you get dressed. Do not have throw rugs and other things on the floor that can make you trip. What can I do in the kitchen? Clean up any spills right away. Avoid walking on wet floors. Keep items that you use a lot in easy-to-reach places. If you need to reach something above you, use a strong step stool that has a grab bar. Keep electrical cords out of the way. Do not use floor polish or wax that makes floors slippery. If you must use wax, use non-skid floor wax. Do not have throw rugs and other things on the floor that can make you trip. What can I do with my stairs? Do not leave any items on the stairs. Make sure that there are handrails on both sides of the stairs and use them. Fix handrails that are broken or loose. Make sure that handrails are as long as the stairways. Check any carpeting to make sure that it is firmly attached to the stairs. Fix any carpet that is loose or worn. Avoid having throw rugs at the top or bottom of the stairs. If you do have throw rugs, attach them to the floor with carpet tape. Make sure that you have a light switch at the top of the stairs and the  bottom of the stairs. If you do not have them, ask someone to add them for you. What else can I do to help prevent falls? Wear shoes that: Do not have high heels. Have rubber bottoms. Are comfortable and fit you well. Are closed at the toe. Do not wear sandals. If you use a stepladder: Make sure that it is fully opened. Do not climb a closed stepladder. Make sure that both sides of the stepladder are locked into place. Ask someone to hold it for you, if possible. Clearly mark and make sure that you can see: Any grab bars or handrails. First and last steps. Where the edge of each step is. Use tools that help you move around (mobility aids) if they are needed. These include: Canes. Walkers. Scooters. Crutches. Turn on the lights when you go into a dark area. Replace any light bulbs  as soon as they burn out. Set up your furniture so you have a clear path. Avoid moving your furniture around. If any of your floors are uneven, fix them. If there are any pets around you, be aware of where they are. Review your medicines with your doctor. Some medicines can make you feel dizzy. This can increase your chance of falling. Ask your doctor what other things that you can do to help prevent falls. This information is not intended to replace advice given to you by your health care provider. Make sure you discuss any questions you have with your health care provider. Document Released: 03/29/2009 Document Revised: 11/08/2015 Document Reviewed: 07/07/2014 Elsevier Interactive Patient Education  2017 [employer].

## 2022-09-17 NOTE — Progress Notes (Signed)
Subjective:   SHALAMAR [name] is a 67 y.o. male who presents for Medicare Annual/Subsequent preventive examination.  I connected with  Lynden Ang on 09/17/22 by a telephone enabled telemedicine application and verified that I am speaking with the correct person using two identifiers.   I discussed the limitations of evaluation and management by telemedicine. The patient expressed understanding and agreed to proceed.  Patient location: home  Provider location: telephone / home    Review of Systems     Cardiac Risk Factors include: advanced age (>18men, >42 women);male gender;hypertension;obesity (BMI >30kg/m2);family history of premature cardiovascular disease     Objective:    There were no vitals filed for this visit. There is no height or weight on file to calculate BMI.     09/17/2022    8:38 AM 02/19/2021   10:17 AM 10/09/2020    5:30 PM 10/05/2020   10:39 AM 12/27/2019    8:22 AM 01/25/2018    3:27 PM 10/28/2017    8:45 AM  Advanced Directives  Does Patient Have a Medical Advance Directive? No No No No No Yes Yes  Type of Teacher, early years/pre;Living will [place];Living will  Does patient want to make changes to medical advance directive?       No - Patient declined  Copy of [place] in Chart?       No - copy requested  Would patient like information on creating a medical advance directive?  No - Patient declined No - Patient declined Yes (MAU/Ambulatory/Procedural Areas - Information given) Yes (MAU/Ambulatory/Procedural Areas - Information given)      Current Medications (verified) Outpatient Encounter Medications as of 09/17/2022  Medication Sig   amLODipine (NORVASC) 5 MG tablet Take 1 tablet (5 mg total) by mouth daily.   FLUoxetine (PROZAC) 20 MG capsule Take 1-2 capsules (20-40 mg total) by mouth daily.   No facility-administered encounter medications on file as of 09/17/2022.    Allergies  (verified) Patient has no known allergies.   History: Past Medical History:  Diagnosis Date   Anxiety    Arthritis    Basal cell carcinoma 04/29/2018   upper mid back, right mid back    Basal cell carcinoma 04/29/2018   right mid back    Basal cell carcinoma 06/10/2018   left lover back    Bronchitis    Cataract    Coronary artery disease    Mild-moderate non-obstructive CAD 06/15/17 (20% dLM, 50% mLAD, 30% OM3, 40% mRCA, 40% dRCA)   COVID-19 unknown   Depression    Former heavy cigarette smoker (20-39 per day) 03/05/2018   Quit 2000; 60 pak year history   History of hiatal hernia    HTN (hypertension)    Substance abuse ([place])    4 years as of march 2023   Urinary incontinence, unspecified type    Past Surgical History:  Procedure Laterality Date   HERNIA REPAIR     KNEE ARTHROSCOPY Bilateral    LEFT HEART CATH AND CORONARY ANGIOGRAPHY N/A 06/15/2017   Procedure: LEFT HEART CATH AND CORONARY ANGIOGRAPHY;  Surgeon: Nelva Bush, MD;  Location: [facility];  Service: Cardiovascular;  Laterality: N/A;   REPLACEMENT TOTAL KNEE BILATERAL     TONSILLECTOMY     TOTAL KNEE ARTHROPLASTY Left 10/09/2020   Procedure: LEFT TOTAL KNEE ARTHROPLASTY;  Surgeon: Mcarthur Rossetti, MD;  Location: [facility];  Service: Orthopedics;  Laterality: Left;   TOTAL KNEE ARTHROPLASTY Right 03/05/2021   Procedure: RIGHT TOTAL KNEE ARTHROPLASTY;  Surgeon: Mcarthur Rossetti, MD;  Location: [facility];  Service: Orthopedics;  Laterality: Right;   UMBILICAL HERNIA REPAIR N/A 11/03/2017   Procedure: UMBILICAL HERNIA REPAIR;  Surgeon: Clovis Riley, MD;  Location: [facility];  Service: General;  Laterality: N/A;   Family History  Problem Relation Age of Onset   Dementia Mother        alcohol   Alcohol abuse Mother    Heart attack Father 17   Heart disease Father 70   COPD Father    Stroke Father    CAD Brother    Heart attack Brother    Healthy Daughter    Healthy Son    Healthy  Son    Social History   Socioeconomic History   Marital status: Married    Spouse name: Not on file   Number of children: 3   Years of education: Not on file   Highest education level: Bachelor's degree (e.g., BA, AB, BS)  Occupational History   Occupation: disabled  Tobacco Use   Smoking status: Former    Packs/day: 2.00    Years: 30.00    Additional pack years: 0.00    Total pack years: 60.00    Types: Cigarettes    Quit date: 06/16/1998    Years since quitting: 24.2   Smokeless tobacco: Never  Vaping Use   Vaping Use: Never used  Substance and Sexual Activity   Alcohol use: No    Comment: sober 08-28-2017   Drug use: Not Currently   Sexual activity: Yes    Partners: Female  Other Topics Concern   Not on file  Social History Narrative   Patient lives with spouse.  1 dog.    Social Determinants of Health   Financial Resource Strain: Low Risk  (09/17/2022)   Overall Financial Resource Strain (CARDIA)    Difficulty of Paying Living Expenses: Not hard at all  Food Insecurity: No Food Insecurity (09/17/2022)   Hunger Vital Sign    Worried About Running Out of Food in the Last Year: Never true    Ran Out of Food in the Last Year: Never true  Transportation Needs: No Transportation Needs (09/17/2022)   PRAPARE - Hydrologist (Medical): No    Lack of Transportation (Non-Medical): No  Physical Activity: Insufficiently Active (09/17/2022)   Exercise Vital Sign    Days of Exercise per Week: 3 days    Minutes of Exercise per Session: 30 min  Stress: No Stress Concern Present (09/17/2022)   [place]    Feeling of Stress : Not at all  Social Connections: Moderately Integrated (09/17/2022)   Social Connection and Isolation Panel [NHANES]    Frequency of Communication with Friends and Family: More than three times a week    Frequency of Social Gatherings with Friends and Family: More than  three times a week    Attends Religious Services: Never    Marine scientist or Organizations: No    Attends Music therapist: 1 to 4 times per year    Marital Status: Married    Tobacco Counseling Counseling given: Not Answered   Clinical Intake:  Pre-visit preparation completed: Yes  Pain : No/denies pain     Diabetes: No  How often do you need to have someone help you when you read instructions,  pamphlets, or other written materials from your doctor or pharmacy?: 1 - Never  Diabetic?no  Interpreter Needed?: No  Information entered by :: Leroy Kennedy LPN   Activities of Daily Living    09/17/2022    8:37 AM  In your present state of health, do you have any difficulty performing the following activities:  Hearing? 1  Vision? 0  Difficulty concentrating or making decisions? 0  Walking or climbing stairs? 0  Dressing or bathing? 0  Doing errands, shopping? 0  Preparing Food and eating ? N  Using the Toilet? N  In the past six months, have you accidently leaked urine? N  Do you have problems with loss of bowel control? N  Managing your Medications? N  Managing your Finances? N  Housekeeping or managing your Housekeeping? N    Patient Care Team: Evangeline Gula, NP as PCP - General (Family Medicine) Nahser, Wonda Cheng, MD as Consulting Physician (Cardiology) Paralee Cancel, MD as Consulting Physician (Orthopedic Surgery)  Indicate any recent Medical Services you may have received from other than [facility] providers in the past year (date may be approximate).     Assessment:   This is a routine wellness examination for [place].  Hearing/Vision screen Hearing Screening - Comments:: Yes  Has hearing aids  Broke will be getting fixed Vision Screening - Comments:: [facility] unsure of name Up to date  Dietary issues and exercise activities discussed: Current Exercise Habits: Home exercise routine, Time (Minutes): 20, Frequency (Times/Week): 2,  Weekly Exercise (Minutes/Week): 40   Goals Addressed             This Visit's Progress    Patient Stated       Travel more       Depression Screen    09/17/2022    8:36 AM 08/25/2022    8:04 AM 12/31/2021    8:51 AM 07/04/2021    3:13 PM 07/02/2021    3:32 PM 05/14/2021    9:58 AM 02/01/2021   10:35 AM  PHQ 2/9 Scores  PHQ - 2 Score 0 0 0 0 0 0 0  PHQ- 9 Score 0 0 0  0  0    Fall Risk    09/17/2022    8:33 AM 08/25/2022    8:04 AM 12/31/2021    8:51 AM 12/24/2021   10:01 AM 09/19/2021    9:38 AM  Fall Risk   Falls in the past year? 0  0 1 0  Number falls in past yr: 0 0 0 0 0  Injury with Fall? 0 0 0 0   Risk for fall due to :   No Fall Risks    Follow up Falls evaluation completed;Education provided;Falls prevention discussed Falls evaluation completed Falls evaluation completed      FALL RISK PREVENTION PERTAINING TO THE HOME:  Any stairs in or around the home? Yes  If so, are there any without handrails? No  Home free of loose throw rugs in walkways, pet beds, electrical cords, etc? Yes  Adequate lighting in your home to reduce risk of falls? Yes   ASSISTIVE DEVICES UTILIZED TO PREVENT FALLS:  Life alert? No  Use of a cane, walker or w/c? No  Grab bars in the bathroom? No  Shower chair or bench in shower? No  Elevated toilet seat or a handicapped toilet? No   TIMED UP AND GO:  Was the test performed? No .   Cognitive Function:  09/17/2022    8:40 AM  6CIT Screen  What Year? 0 points  What month? 0 points  What time? 0 points  Count back from 20 0 points  Months in reverse 0 points  Repeat phrase 2 points  Total Score 2 points    Immunizations Immunization History  Administered Date(s) Administered   Fluad Quad(high Dose 65+) 03/21/2020, 07/02/2021   Influenza, Quadrivalent, Recombinant, Inj, Pf 02/11/2019   Influenza,inj,Quad PF,6+ Mos 03/17/2014, 06/02/2017, 03/05/2018   Influenza,inj,quad, With Preservative 05/16/2017   PFIZER(Purple  Top)SARS-COV-2 Vaccination 07/18/2019, 08/08/2019   PNEUMOCOCCAL CONJUGATE-20 07/02/2021   Pneumococcal Conjugate-13 12/29/2019   Tdap 06/16/2016   Zoster Recombinat (Shingrix) 09/01/2016, 06/02/2017    TDAP status: Up to date  Flu Vaccine status: Up to date  Pneumococcal vaccine status: Up to date  Covid-19 vaccine status: Information provided on how to obtain vaccines.   Qualifies for Shingles Vaccine? No   Zostavax completed No   Shingrix Completed?: No.    Education has been provided regarding the importance of this vaccine. Patient has been advised to call insurance company to determine out of pocket expense if they have not yet received this vaccine. Advised may also receive vaccine at local pharmacy or Health Dept. Verbalized acceptance and understanding.  Screening Tests Health Maintenance  Topic Date Due   COVID-19 Vaccine (3 - 2023-24 season) 10/03/2022 (Originally 02/14/2022)   INFLUENZA VACCINE  01/15/2023   Medicare Annual Wellness (AWV)  09/17/2023   Fecal DNA (Cologuard)  07/22/2024   DTaP/Tdap/Td (2 - Td or Tdap) 06/16/2026   Pneumonia Vaccine 64+ Years old  Completed   Hepatitis C Screening  Completed   Zoster Vaccines- Shingrix  Completed   HPV VACCINES  Aged Out    Health Maintenance  There are no preventive care reminders to display for this patient.   Colorectal cancer screening: Type of screening: Cologuard. Completed 2023. Repeat every 3 years  Lung Cancer Screening: (Low Dose CT Chest recommended if Age 21-80 years, 30 pack-year currently smoking OR have quit w/in 15years.) does not qualify.   Lung Cancer Screening Referral:   Additional Screening:  Hepatitis C Screening: does not qualify; Completed 2018  Vision Screening: Recommended annual ophthalmology exams for early detection of glaucoma and other disorders of the eye. Is the patient up to date with their annual eye exam?  Yes  Who is the provider or what is the name of the office in which  the patient attends annual eye exams? [facility] If pt is not established with a provider, would they like to be referred to a provider to establish care? No .   Dental Screening: Recommended annual dental exams for proper oral hygiene  Community Resource Referral / Chronic Care Management: CRR required this visit?  No   CCM required this visit?  No      Plan:     I have personally reviewed and noted the following in the patient's chart:   Medical and social history Use of alcohol, tobacco or illicit drugs  Current medications and supplements including opioid prescriptions. Patient is not currently taking opioid prescriptions. Functional ability and status Nutritional status Physical activity Advanced directives List of other physicians Hospitalizations, surgeries, and ER visits in previous 12 months Vitals Screenings to include cognitive, depression, and falls Referrals and appointments  In addition, I have reviewed and discussed with patient certain preventive protocols, quality metrics, and best practice recommendations. A written personalized care plan for preventive services as well as general preventive health  recommendations were provided to patient.     Leroy Kennedy, LPN   [phone]   Nurse Notes:

## 2022-09-26 ENCOUNTER — Telehealth: Payer: Self-pay | Admitting: Family Medicine

## 2022-09-26 NOTE — Telephone Encounter (Signed)
Cala Bradford from [facility] called to speak someone about this Patient. Cala Bradford need some information about patient visit with JoAnna on 08/25/22. Cala Bradford asked if incontinent was dicussed in this visit it.The best contact for Cala Bradford is [phone].

## 2022-09-29 NOTE — Telephone Encounter (Signed)
Spoke with Marchelle Folks at [employer] to let her know Incontinence was not discussed.

## 2022-12-15 ENCOUNTER — Other Ambulatory Visit: Payer: Self-pay

## 2022-12-15 ENCOUNTER — Other Ambulatory Visit: Payer: Self-pay | Admitting: Family Medicine

## 2022-12-15 DIAGNOSIS — I10 Essential (primary) hypertension: Secondary | ICD-10-CM

## 2022-12-15 MED ORDER — AMLODIPINE BESYLATE 5 MG PO TABS
5.0000 mg | ORAL_TABLET | Freq: Every day | ORAL | 2 refills | Status: AC
Start: 2022-12-15 — End: ?

## 2022-12-15 NOTE — Telephone Encounter (Signed)
Last seen in office 08/25/2022 Upcoming appt. 02/25/2023 Refill 90 days

## 2022-12-16 ENCOUNTER — Other Ambulatory Visit: Payer: Self-pay

## 2022-12-22 ENCOUNTER — Encounter: Payer: Self-pay | Admitting: Orthopaedic Surgery

## 2022-12-22 ENCOUNTER — Other Ambulatory Visit (INDEPENDENT_AMBULATORY_CARE_PROVIDER_SITE_OTHER): Payer: Medicare HMO

## 2022-12-22 ENCOUNTER — Ambulatory Visit (INDEPENDENT_AMBULATORY_CARE_PROVIDER_SITE_OTHER): Payer: Medicare HMO | Admitting: Orthopaedic Surgery

## 2022-12-22 DIAGNOSIS — Z96652 Presence of left artificial knee joint: Secondary | ICD-10-CM | POA: Diagnosis not present

## 2022-12-22 NOTE — Progress Notes (Signed)
The patient is well-known to Samuel [name].  He is a 67 year old active gentleman who we replaced his left knee and April 2022 and his right knee in September 2022.  He says he has had pain in the left knee from about a month after he hit his knee on a trailer hitch and is still little bothersome so he wanted to make sure that there is no issues with his knee.  He is walking without any assistive device.  [place] and has done a lot of biking as well.  He is not walking with a limp either today.  Examination of his left knee shows no bruising or effusion.  His knee range of motion is full and his knee is ligamentously stable.  2 views of the left knee show well-seated total knee arthroplasty with no complicating features.  There is no effusion and no evidence of fracture.  This gave him reassurance that his knee is doing well.  He can continue to increase his activities as comfort allows.  Follow-up can be as needed but if he does develop issues he knows to let Samuel [name] know.

## 2022-12-31 ENCOUNTER — Ambulatory Visit: Payer: Medicare HMO | Admitting: Orthopaedic Surgery

## 2023-01-29 ENCOUNTER — Encounter (INDEPENDENT_AMBULATORY_CARE_PROVIDER_SITE_OTHER): Payer: Self-pay

## 2023-02-25 ENCOUNTER — Encounter: Payer: Medicare HMO | Admitting: Family Medicine

## 2023-02-25 DIAGNOSIS — Z0000 Encounter for general adult medical examination without abnormal findings: Secondary | ICD-10-CM

## 2023-02-25 NOTE — Progress Notes (Unsigned)
   Complete physical exam  Patient: Samuel [name]   DOB: [birth year]   67 y.o. Male  MRN: [id number]  Subjective:    No chief complaint on file.   Samuel [name] is a 67 y.o. male who presents today for a complete physical exam. He reports consuming a {diet types:17450} diet. {types:19826} He generally feels {DESC; WELL/FAIRLY WELL/POORLY:18703}. He reports sleeping {DESC; WELL/FAIRLY WELL/POORLY:18703}. He {does/does not:200015} have additional problems to discuss today.    Most recent fall risk assessment:    09/17/2022    8:33 AM  Fall Risk   Falls in the past year? 0  Number falls in past yr: 0  Injury with Fall? 0  Follow up Falls evaluation completed;Education provided;Falls prevention discussed     Most recent depression screenings:    09/17/2022    8:36 AM 08/25/2022    8:04 AM  PHQ 2/9 Scores  PHQ - 2 Score 0 0  PHQ- 9 Score 0 0    {VISON DENTAL STD PSA (Optional):27386}  {History (Optional):23778}  Patient Care Team: Alveria Apley, NP as PCP - General (Family Medicine) Nahser, Deloris Ping, MD as Consulting Physician (Cardiology) Durene Romans, MD as Consulting Physician (Orthopedic Surgery)   Outpatient Medications Prior to Visit  Medication Sig  . amLODipine (NORVASC) 5 MG tablet TAKE 1 TABLET BY MOUTH DAILY  . amLODipine (NORVASC) 5 MG tablet Take 1 tablet (5 mg total) by mouth daily.  Marland Kitchen FLUoxetine (PROZAC) 20 MG capsule Take 1-2 capsules (20-40 mg total) by mouth daily.   No facility-administered medications prior to visit.    ROS See HPI above       Objective:     There were no vitals taken for this visit. {Vitals History (Optional):23777}  Physical Exam   No results found for any visits on 02/25/23. {Show previous labs (optional):23779}    Assessment & Plan:    Routine Health Maintenance and Physical Exam  Immunization History  Administered Date(s) Administered  . Fluad Quad(high Dose 65+) 03/21/2020, 07/02/2021  . Influenza,  Quadrivalent, Recombinant, Inj, Pf 02/11/2019  . Influenza,inj,Quad PF,6+ Mos 03/17/2014, 06/02/2017, 03/05/2018  . Influenza,inj,quad, With Preservative 05/16/2017  . PFIZER(Purple Top)SARS-COV-2 Vaccination 07/18/2019, 08/08/2019  . PNEUMOCOCCAL CONJUGATE-20 07/02/2021  . Pneumococcal Conjugate-13 12/29/2019  . Tdap 06/16/2016  . Zoster Recombinant(Shingrix) 09/01/2016, 06/02/2017    Health Maintenance  Topic Date Due  . INFLUENZA VACCINE  01/15/2023  . COVID-19 Vaccine (3 - 2023-24 season) 02/15/2023  . Medicare Annual Wellness (AWV)  09/17/2023  . Fecal DNA (Cologuard)  07/22/2024  . DTaP/Tdap/Td (2 - Td or Tdap) 06/16/2026  . Pneumonia Vaccine 68+ Years old  Completed  . Hepatitis C Screening  Completed  . Zoster Vaccines- Shingrix  Completed  . HPV VACCINES  Aged Out    Discussed health benefits of physical activity, and encouraged him to engage in regular exercise appropriate for his age and condition.  Annual physical exam    No follow-ups on file. 1.Reviewed Health Maintenance -Influenza -Covid 12-     Debbe Odea, RN

## 2023-03-11 ENCOUNTER — Telehealth: Payer: Self-pay

## 2023-03-11 NOTE — Telephone Encounter (Signed)
Sent to [facility].

## 2023-03-12 NOTE — Telephone Encounter (Signed)
Faxed

## 2023-04-14 ENCOUNTER — Ambulatory Visit: Payer: Medicare HMO | Admitting: Family Medicine

## 2023-10-07 ENCOUNTER — Other Ambulatory Visit: Payer: Self-pay | Admitting: Family Medicine

## 2023-10-07 DIAGNOSIS — F411 Generalized anxiety disorder: Secondary | ICD-10-CM

## 2023-12-02 DIAGNOSIS — R739 Hyperglycemia, unspecified: Secondary | ICD-10-CM | POA: Diagnosis not present

## 2023-12-02 DIAGNOSIS — E66813 Obesity, class 3: Secondary | ICD-10-CM | POA: Diagnosis not present

## 2023-12-02 DIAGNOSIS — Z79899 Other long term (current) drug therapy: Secondary | ICD-10-CM | POA: Diagnosis not present

## 2023-12-02 DIAGNOSIS — I10 Essential (primary) hypertension: Secondary | ICD-10-CM | POA: Diagnosis not present

## 2023-12-02 DIAGNOSIS — H259 Unspecified age-related cataract: Secondary | ICD-10-CM | POA: Diagnosis not present

## 2023-12-02 DIAGNOSIS — Z6841 Body Mass Index (BMI) 40.0 and over, adult: Secondary | ICD-10-CM | POA: Diagnosis not present

## 2023-12-02 DIAGNOSIS — F419 Anxiety disorder, unspecified: Secondary | ICD-10-CM | POA: Diagnosis not present

## 2023-12-02 DIAGNOSIS — M4807 Spinal stenosis, lumbosacral region: Secondary | ICD-10-CM | POA: Diagnosis not present

## 2023-12-02 DIAGNOSIS — Z1389 Encounter for screening for other disorder: Secondary | ICD-10-CM | POA: Diagnosis not present

## 2023-12-17 DIAGNOSIS — M5416 Radiculopathy, lumbar region: Secondary | ICD-10-CM | POA: Diagnosis not present

## 2024-01-13 DIAGNOSIS — M48061 Spinal stenosis, lumbar region without neurogenic claudication: Secondary | ICD-10-CM | POA: Diagnosis not present

## 2024-01-13 DIAGNOSIS — M4316 Spondylolisthesis, lumbar region: Secondary | ICD-10-CM | POA: Diagnosis not present

## 2024-01-13 DIAGNOSIS — M51369 Other intervertebral disc degeneration, lumbar region without mention of lumbar back pain or lower extremity pain: Secondary | ICD-10-CM | POA: Diagnosis not present

## 2024-01-13 DIAGNOSIS — M47816 Spondylosis without myelopathy or radiculopathy, lumbar region: Secondary | ICD-10-CM | POA: Diagnosis not present

## 2024-01-13 DIAGNOSIS — M5127 Other intervertebral disc displacement, lumbosacral region: Secondary | ICD-10-CM | POA: Diagnosis not present

## 2024-01-20 DIAGNOSIS — M4807 Spinal stenosis, lumbosacral region: Secondary | ICD-10-CM | POA: Diagnosis not present

## 2024-01-20 DIAGNOSIS — Z79899 Other long term (current) drug therapy: Secondary | ICD-10-CM | POA: Diagnosis not present

## 2024-01-20 DIAGNOSIS — Z125 Encounter for screening for malignant neoplasm of prostate: Secondary | ICD-10-CM | POA: Diagnosis not present

## 2024-01-20 DIAGNOSIS — Z0000 Encounter for general adult medical examination without abnormal findings: Secondary | ICD-10-CM | POA: Diagnosis not present

## 2024-01-20 DIAGNOSIS — L821 Other seborrheic keratosis: Secondary | ICD-10-CM | POA: Diagnosis not present

## 2024-01-20 DIAGNOSIS — H9193 Unspecified hearing loss, bilateral: Secondary | ICD-10-CM | POA: Diagnosis not present

## 2024-01-20 DIAGNOSIS — I10 Essential (primary) hypertension: Secondary | ICD-10-CM | POA: Diagnosis not present

## 2024-01-20 DIAGNOSIS — R739 Hyperglycemia, unspecified: Secondary | ICD-10-CM | POA: Diagnosis not present

## 2024-01-21 DIAGNOSIS — M48062 Spinal stenosis, lumbar region with neurogenic claudication: Secondary | ICD-10-CM | POA: Diagnosis not present

## 2024-01-21 DIAGNOSIS — M5416 Radiculopathy, lumbar region: Secondary | ICD-10-CM | POA: Diagnosis not present

## 2024-02-10 DIAGNOSIS — M5416 Radiculopathy, lumbar region: Secondary | ICD-10-CM | POA: Diagnosis not present

## 2024-02-23 DIAGNOSIS — F419 Anxiety disorder, unspecified: Secondary | ICD-10-CM | POA: Diagnosis not present

## 2024-02-23 DIAGNOSIS — M545 Low back pain, unspecified: Secondary | ICD-10-CM | POA: Diagnosis not present

## 2024-02-23 DIAGNOSIS — M1990 Unspecified osteoarthritis, unspecified site: Secondary | ICD-10-CM | POA: Diagnosis not present

## 2024-02-23 DIAGNOSIS — I10 Essential (primary) hypertension: Secondary | ICD-10-CM | POA: Diagnosis not present

## 2024-02-23 DIAGNOSIS — M5416 Radiculopathy, lumbar region: Secondary | ICD-10-CM | POA: Diagnosis not present

## 2024-02-23 DIAGNOSIS — G8929 Other chronic pain: Secondary | ICD-10-CM | POA: Diagnosis not present

## 2024-03-09 DIAGNOSIS — M5416 Radiculopathy, lumbar region: Secondary | ICD-10-CM | POA: Diagnosis not present

## 2024-03-11 DIAGNOSIS — I10 Essential (primary) hypertension: Secondary | ICD-10-CM | POA: Diagnosis not present

## 2024-03-11 DIAGNOSIS — F419 Anxiety disorder, unspecified: Secondary | ICD-10-CM | POA: Diagnosis not present

## 2024-03-11 DIAGNOSIS — M545 Low back pain, unspecified: Secondary | ICD-10-CM | POA: Diagnosis not present

## 2024-03-11 DIAGNOSIS — M5416 Radiculopathy, lumbar region: Secondary | ICD-10-CM | POA: Diagnosis not present

## 2024-03-11 DIAGNOSIS — G8929 Other chronic pain: Secondary | ICD-10-CM | POA: Diagnosis not present

## 2024-03-18 DIAGNOSIS — M1990 Unspecified osteoarthritis, unspecified site: Secondary | ICD-10-CM | POA: Diagnosis not present

## 2024-03-18 DIAGNOSIS — F419 Anxiety disorder, unspecified: Secondary | ICD-10-CM | POA: Diagnosis not present

## 2024-03-18 DIAGNOSIS — M5416 Radiculopathy, lumbar region: Secondary | ICD-10-CM | POA: Diagnosis not present

## 2024-03-18 DIAGNOSIS — I10 Essential (primary) hypertension: Secondary | ICD-10-CM | POA: Diagnosis not present

## 2024-03-18 DIAGNOSIS — M545 Low back pain, unspecified: Secondary | ICD-10-CM | POA: Diagnosis not present

## 2024-03-18 DIAGNOSIS — G8929 Other chronic pain: Secondary | ICD-10-CM | POA: Diagnosis not present

## 2024-03-23 DIAGNOSIS — F419 Anxiety disorder, unspecified: Secondary | ICD-10-CM | POA: Diagnosis not present

## 2024-03-23 DIAGNOSIS — R739 Hyperglycemia, unspecified: Secondary | ICD-10-CM | POA: Diagnosis not present

## 2024-03-23 DIAGNOSIS — L821 Other seborrheic keratosis: Secondary | ICD-10-CM | POA: Diagnosis not present

## 2024-03-23 DIAGNOSIS — Z1389 Encounter for screening for other disorder: Secondary | ICD-10-CM | POA: Diagnosis not present

## 2024-03-23 DIAGNOSIS — E7800 Pure hypercholesterolemia, unspecified: Secondary | ICD-10-CM | POA: Diagnosis not present

## 2024-03-23 DIAGNOSIS — Z23 Encounter for immunization: Secondary | ICD-10-CM | POA: Diagnosis not present

## 2024-03-23 DIAGNOSIS — M48062 Spinal stenosis, lumbar region with neurogenic claudication: Secondary | ICD-10-CM | POA: Diagnosis not present

## 2024-03-25 DIAGNOSIS — I10 Essential (primary) hypertension: Secondary | ICD-10-CM | POA: Diagnosis not present

## 2024-03-25 DIAGNOSIS — M1990 Unspecified osteoarthritis, unspecified site: Secondary | ICD-10-CM | POA: Diagnosis not present

## 2024-03-25 DIAGNOSIS — M545 Low back pain, unspecified: Secondary | ICD-10-CM | POA: Diagnosis not present

## 2024-03-25 DIAGNOSIS — G8929 Other chronic pain: Secondary | ICD-10-CM | POA: Diagnosis not present

## 2024-03-25 DIAGNOSIS — F419 Anxiety disorder, unspecified: Secondary | ICD-10-CM | POA: Diagnosis not present

## 2024-03-25 DIAGNOSIS — M5416 Radiculopathy, lumbar region: Secondary | ICD-10-CM | POA: Diagnosis not present

## 2024-03-30 DIAGNOSIS — M5416 Radiculopathy, lumbar region: Secondary | ICD-10-CM | POA: Diagnosis not present

## 2024-04-06 DIAGNOSIS — H903 Sensorineural hearing loss, bilateral: Secondary | ICD-10-CM | POA: Diagnosis not present

## 2024-04-06 DIAGNOSIS — M5416 Radiculopathy, lumbar region: Secondary | ICD-10-CM | POA: Diagnosis not present

## 2024-04-07 DIAGNOSIS — M5416 Radiculopathy, lumbar region: Secondary | ICD-10-CM | POA: Diagnosis not present

## 2024-04-07 DIAGNOSIS — G8929 Other chronic pain: Secondary | ICD-10-CM | POA: Diagnosis not present

## 2024-04-07 DIAGNOSIS — F419 Anxiety disorder, unspecified: Secondary | ICD-10-CM | POA: Diagnosis not present

## 2024-04-07 DIAGNOSIS — M545 Low back pain, unspecified: Secondary | ICD-10-CM | POA: Diagnosis not present

## 2024-04-07 DIAGNOSIS — I10 Essential (primary) hypertension: Secondary | ICD-10-CM | POA: Diagnosis not present

## 2024-04-07 DIAGNOSIS — M1990 Unspecified osteoarthritis, unspecified site: Secondary | ICD-10-CM | POA: Diagnosis not present
